# Patient Record
Sex: Female | Born: 1975 | Race: White | Hispanic: No | Smoking: Never smoker
Health system: Southern US, Community
[De-identification: ages and names within clinical notes are randomized; demographics above are authoritative.]

## PROBLEM LIST (undated history)

## (undated) DIAGNOSIS — T7840XA Allergy, unspecified, initial encounter: Secondary | ICD-10-CM

## (undated) DIAGNOSIS — I10 Essential (primary) hypertension: Secondary | ICD-10-CM

## (undated) DIAGNOSIS — M7730 Calcaneal spur, unspecified foot: Secondary | ICD-10-CM

## (undated) DIAGNOSIS — E282 Polycystic ovarian syndrome: Secondary | ICD-10-CM

## (undated) DIAGNOSIS — G5620 Lesion of ulnar nerve, unspecified upper limb: Secondary | ICD-10-CM

## (undated) DIAGNOSIS — Z91018 Allergy to other foods: Secondary | ICD-10-CM

## (undated) DIAGNOSIS — K219 Gastro-esophageal reflux disease without esophagitis: Secondary | ICD-10-CM

## (undated) DIAGNOSIS — E079 Disorder of thyroid, unspecified: Secondary | ICD-10-CM

## (undated) DIAGNOSIS — R600 Localized edema: Secondary | ICD-10-CM

## (undated) DIAGNOSIS — M7580 Other shoulder lesions, unspecified shoulder: Secondary | ICD-10-CM

## (undated) DIAGNOSIS — M21619 Bunion of unspecified foot: Secondary | ICD-10-CM

## (undated) DIAGNOSIS — J302 Other seasonal allergic rhinitis: Secondary | ICD-10-CM

## (undated) DIAGNOSIS — G43909 Migraine, unspecified, not intractable, without status migrainosus: Secondary | ICD-10-CM

## (undated) DIAGNOSIS — J45909 Unspecified asthma, uncomplicated: Secondary | ICD-10-CM

## (undated) DIAGNOSIS — E039 Hypothyroidism, unspecified: Secondary | ICD-10-CM

## (undated) HISTORY — DX: Allergy to other foods: Z91.018

## (undated) HISTORY — DX: Gastro-esophageal reflux disease without esophagitis: K21.9

## (undated) HISTORY — DX: Bunion of unspecified foot: M21.619

## (undated) HISTORY — DX: Hypothyroidism, unspecified: E03.9

## (undated) HISTORY — DX: Unspecified asthma, uncomplicated: J45.909

## (undated) HISTORY — DX: Lesion of ulnar nerve, unspecified upper limb: G56.20

## (undated) HISTORY — DX: Migraine, unspecified, not intractable, without status migrainosus: G43.909

## (undated) HISTORY — DX: Allergy, unspecified, initial encounter: T78.40XA

## (undated) HISTORY — PX: OOPHORECTOMY: SHX86

## (undated) HISTORY — DX: Other shoulder lesions, unspecified shoulder: M75.80

## (undated) HISTORY — DX: Localized edema: R60.0

## (undated) HISTORY — DX: Polycystic ovarian syndrome: E28.2

## (undated) HISTORY — DX: Essential (primary) hypertension: I10

## (undated) HISTORY — DX: Calcaneal spur, unspecified foot: M77.30

## (undated) HISTORY — DX: Disorder of thyroid, unspecified: E07.9

## (undated) HISTORY — DX: Other seasonal allergic rhinitis: J30.2

## (undated) HISTORY — PX: TERATOMA EXCISION: SHX2491

---

## 2005-03-11 ENCOUNTER — Inpatient Hospital Stay (HOSPITAL_COMMUNITY): Admission: AD | Admit: 2005-03-11 | Discharge: 2005-03-12 | Payer: Self-pay | Admitting: Obstetrics & Gynecology

## 2005-03-16 ENCOUNTER — Other Ambulatory Visit: Admission: RE | Admit: 2005-03-16 | Discharge: 2005-03-16 | Payer: Self-pay | Admitting: Obstetrics & Gynecology

## 2005-03-16 ENCOUNTER — Encounter (INDEPENDENT_AMBULATORY_CARE_PROVIDER_SITE_OTHER): Payer: Self-pay | Admitting: *Deleted

## 2005-03-16 ENCOUNTER — Ambulatory Visit: Payer: Self-pay | Admitting: Obstetrics & Gynecology

## 2005-04-06 ENCOUNTER — Ambulatory Visit: Payer: Self-pay | Admitting: Obstetrics & Gynecology

## 2005-10-12 ENCOUNTER — Ambulatory Visit: Payer: Self-pay | Admitting: Obstetrics and Gynecology

## 2006-03-24 ENCOUNTER — Ambulatory Visit: Payer: Self-pay | Admitting: Obstetrics and Gynecology

## 2006-03-24 ENCOUNTER — Encounter: Payer: Self-pay | Admitting: Obstetrics and Gynecology

## 2006-09-06 HISTORY — PX: TUBAL LIGATION: SHX77

## 2006-10-09 ENCOUNTER — Inpatient Hospital Stay (HOSPITAL_COMMUNITY): Admission: AD | Admit: 2006-10-09 | Discharge: 2006-10-09 | Payer: Self-pay | Admitting: Obstetrics & Gynecology

## 2006-12-14 ENCOUNTER — Encounter (INDEPENDENT_AMBULATORY_CARE_PROVIDER_SITE_OTHER): Payer: Self-pay | Admitting: *Deleted

## 2006-12-14 ENCOUNTER — Ambulatory Visit: Payer: Self-pay | Admitting: Obstetrics & Gynecology

## 2006-12-23 ENCOUNTER — Ambulatory Visit: Payer: Self-pay | Admitting: Internal Medicine

## 2006-12-23 ENCOUNTER — Encounter (INDEPENDENT_AMBULATORY_CARE_PROVIDER_SITE_OTHER): Payer: Self-pay | Admitting: *Deleted

## 2006-12-23 DIAGNOSIS — E282 Polycystic ovarian syndrome: Secondary | ICD-10-CM | POA: Insufficient documentation

## 2006-12-23 DIAGNOSIS — I10 Essential (primary) hypertension: Secondary | ICD-10-CM

## 2006-12-23 DIAGNOSIS — D499 Neoplasm of unspecified behavior of unspecified site: Secondary | ICD-10-CM

## 2006-12-23 LAB — CONVERTED CEMR LAB
Alkaline Phosphatase: 101 units/L (ref 39–117)
BUN: 11 mg/dL (ref 6–23)
Calcium: 9.2 mg/dL (ref 8.4–10.5)
Chloride: 101 meq/L (ref 96–112)
Creatinine, Ser: 0.73 mg/dL (ref 0.40–1.20)
Glucose, Bld: 78 mg/dL (ref 70–99)
Potassium: 4.1 meq/L (ref 3.5–5.3)
Total Bilirubin: 0.3 mg/dL (ref 0.3–1.2)
Urobilinogen, UA: 0.2 (ref 0.0–1.0)

## 2006-12-30 ENCOUNTER — Encounter (INDEPENDENT_AMBULATORY_CARE_PROVIDER_SITE_OTHER): Payer: Self-pay | Admitting: *Deleted

## 2006-12-30 ENCOUNTER — Ambulatory Visit: Payer: Self-pay | Admitting: Hospitalist

## 2006-12-30 LAB — CONVERTED CEMR LAB
CO2: 25 meq/L (ref 19–32)
Calcium: 9.4 mg/dL (ref 8.4–10.5)
Creatinine, Ser: 0.71 mg/dL (ref 0.40–1.20)
HDL: 50 mg/dL (ref >39)
Potassium: 4.5 meq/L (ref 3.5–5.3)
Sodium: 137 meq/L (ref 135–145)
Total CHOL/HDL Ratio: 3
VLDL: 31 mg/dL (ref 0–40)

## 2007-01-04 ENCOUNTER — Encounter (INDEPENDENT_AMBULATORY_CARE_PROVIDER_SITE_OTHER): Payer: Self-pay | Admitting: *Deleted

## 2007-01-04 ENCOUNTER — Ambulatory Visit: Payer: Self-pay | Admitting: Hospitalist

## 2007-01-04 LAB — CONVERTED CEMR LAB
BUN: 14 mg/dL (ref 6–23)
CO2: 24 meq/L (ref 19–32)
Calcium: 9.8 mg/dL (ref 8.4–10.5)
Glucose, Bld: 79 mg/dL (ref 70–99)
Potassium: 4.5 meq/L (ref 3.5–5.3)
Sodium: 137 meq/L (ref 135–145)

## 2007-02-07 ENCOUNTER — Encounter (INDEPENDENT_AMBULATORY_CARE_PROVIDER_SITE_OTHER): Payer: Self-pay | Admitting: *Deleted

## 2007-02-07 ENCOUNTER — Ambulatory Visit: Payer: Self-pay | Admitting: Internal Medicine

## 2007-02-15 ENCOUNTER — Ambulatory Visit: Payer: Self-pay | Admitting: Obstetrics & Gynecology

## 2007-03-24 ENCOUNTER — Ambulatory Visit: Payer: Self-pay | Admitting: Hospitalist

## 2007-03-27 ENCOUNTER — Ambulatory Visit: Payer: Self-pay | Admitting: Obstetrics & Gynecology

## 2007-03-27 ENCOUNTER — Inpatient Hospital Stay (HOSPITAL_COMMUNITY): Admission: RE | Admit: 2007-03-27 | Discharge: 2007-03-28 | Payer: Self-pay | Admitting: Obstetrics & Gynecology

## 2007-03-27 ENCOUNTER — Encounter: Payer: Self-pay | Admitting: Obstetrics & Gynecology

## 2007-06-04 IMAGING — US US TRANSVAGINAL NON-OB
2 series · 13 of 25 positions shown · non-contrast
Comparison: 03/11/2005

TRANSABDOMINAL AND TRANSVAGINAL PELVIC ULTRASOUND:

CLINICAL DATA: Ovarian cyst on outside CT.
TECHNIQUE: Both transabdominal and transvaginal ultrasound examinations of the
pelvis were performed including evaluation of the uterus, ovaries, adnexal
regions, and pelvic cul-de-sac.

[Series 1: us renal · 6 of 29 slices shown (1 of 2)]
[im 1/29]
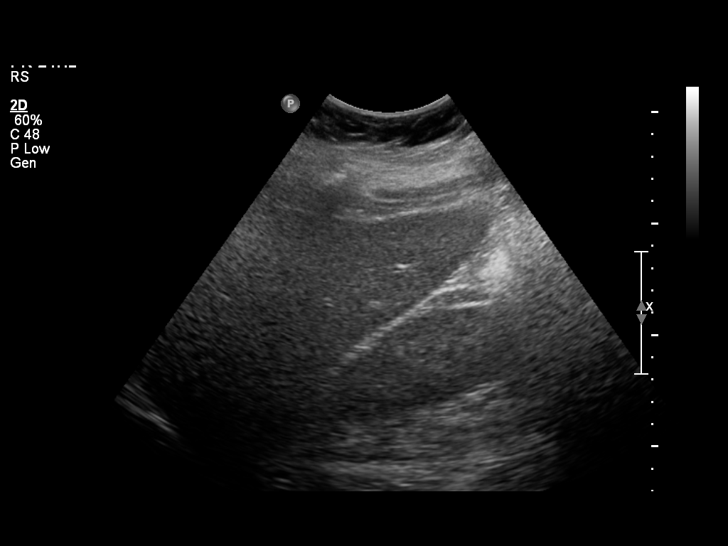
[im 6/29]
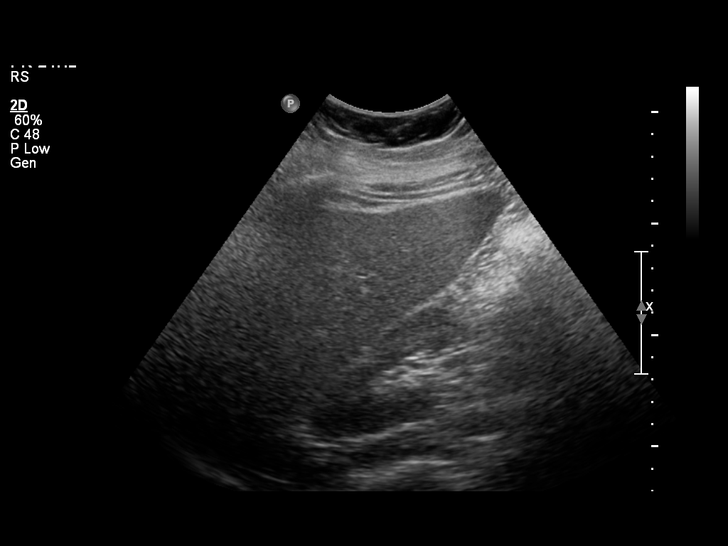
[im 12/29]
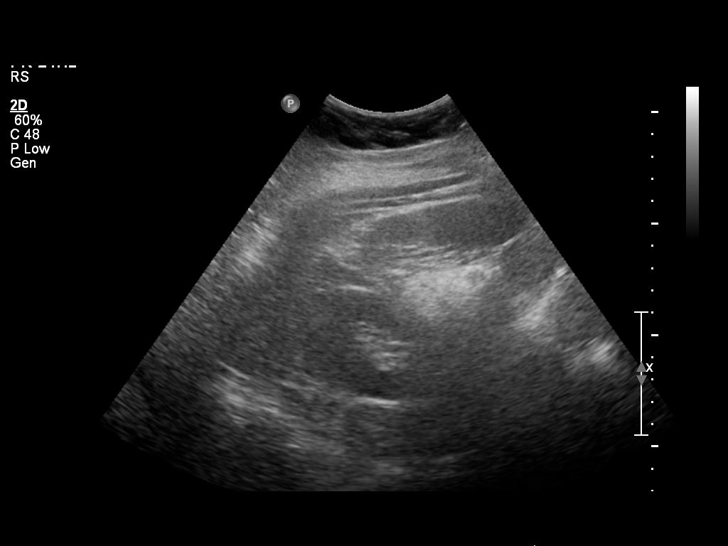
[im 17/29]
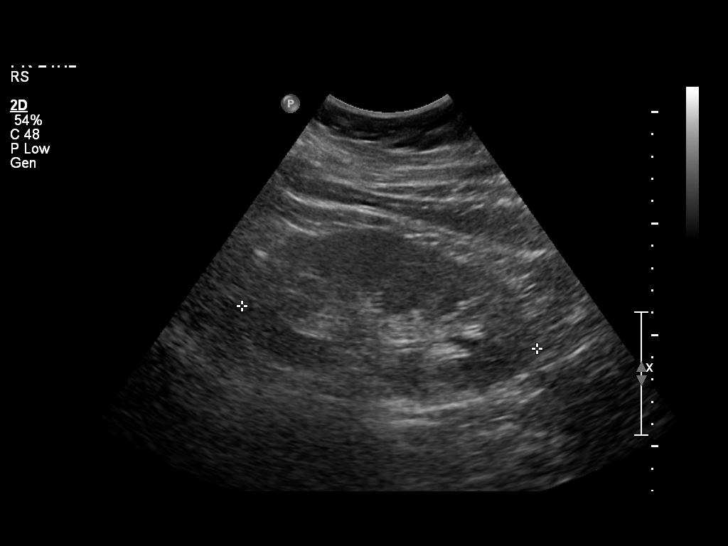
[im 23/29]
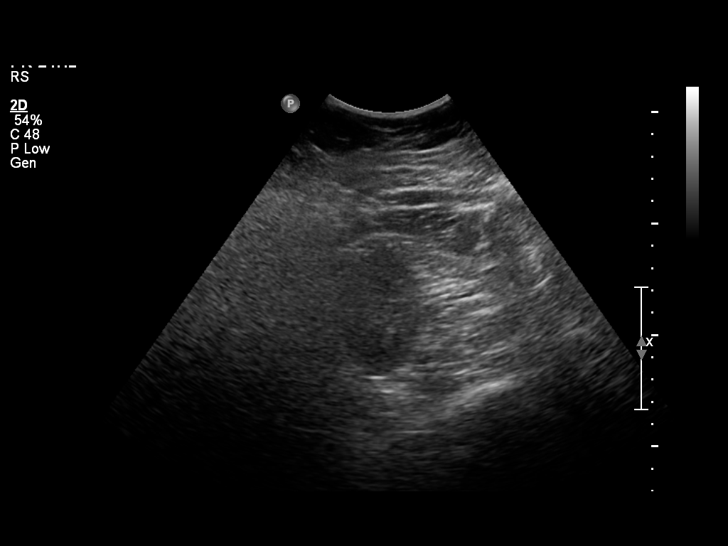
[im 29/29]
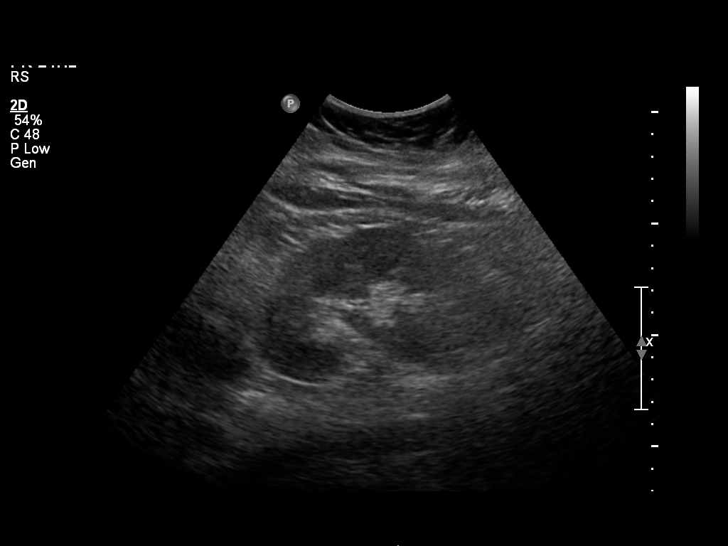

[Series 1: us renal · 7 of 40 slices shown (2 of 2)]
[im 4/40]
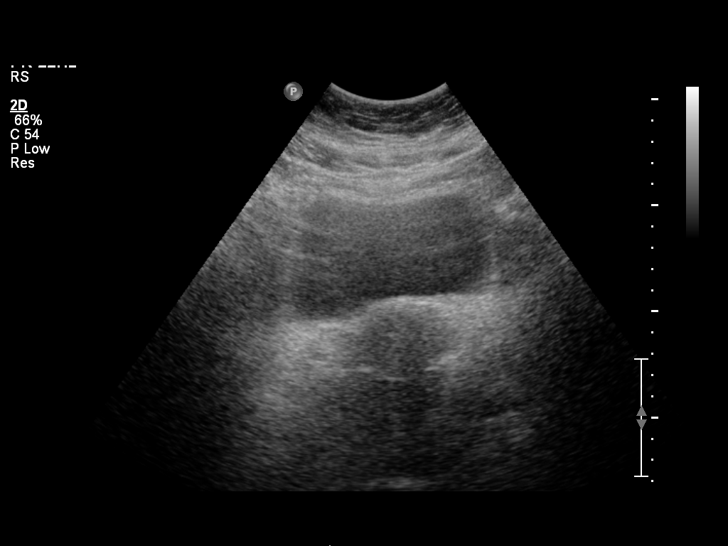
[im 10/40]
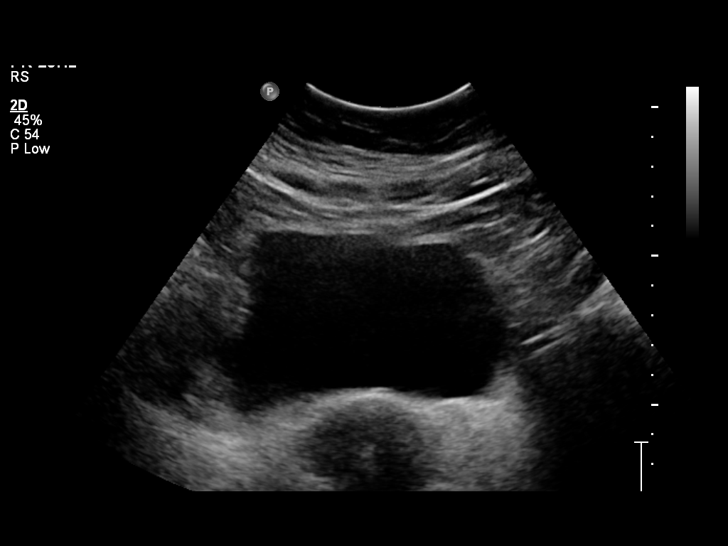
[im 16/40]
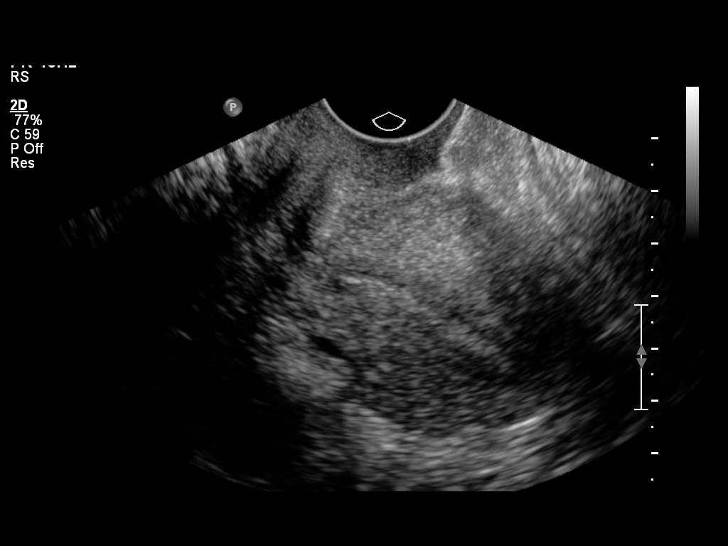
[im 22/40]
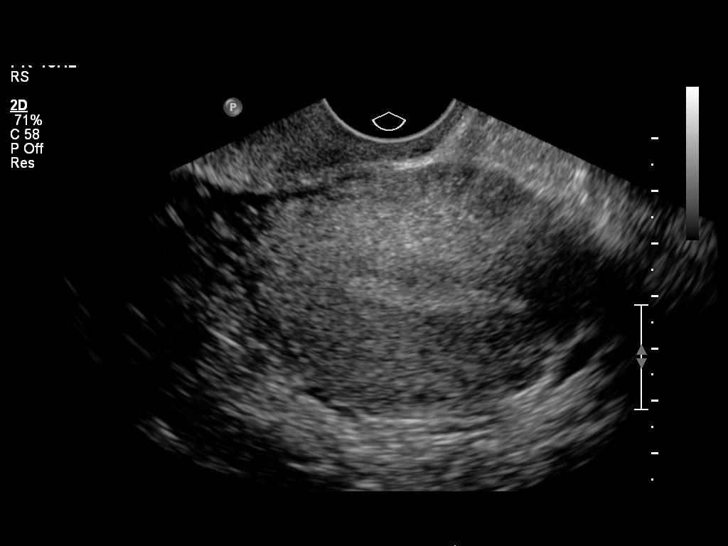
[im 28/40]
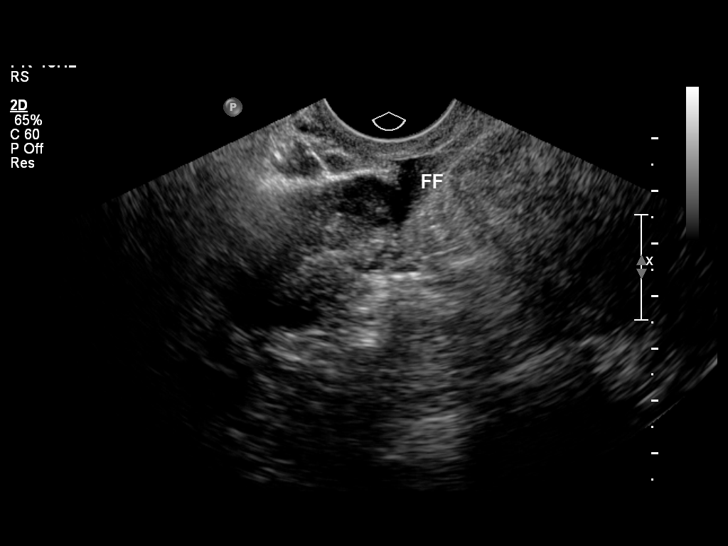
[im 34/40]
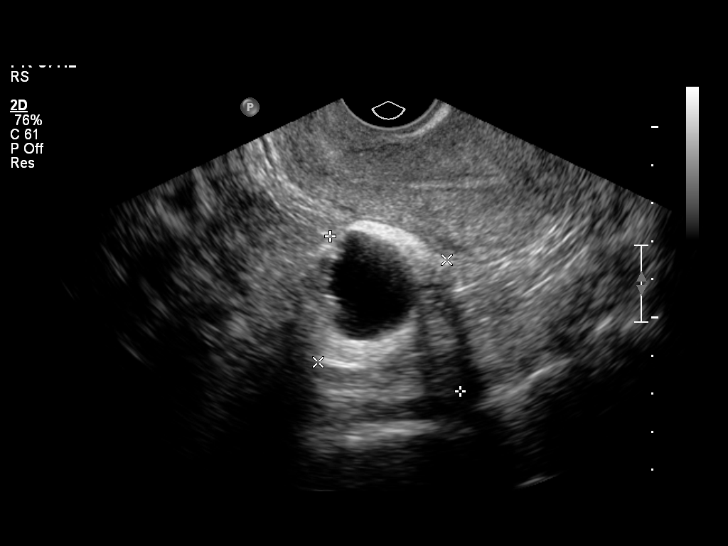
[im 40/40]
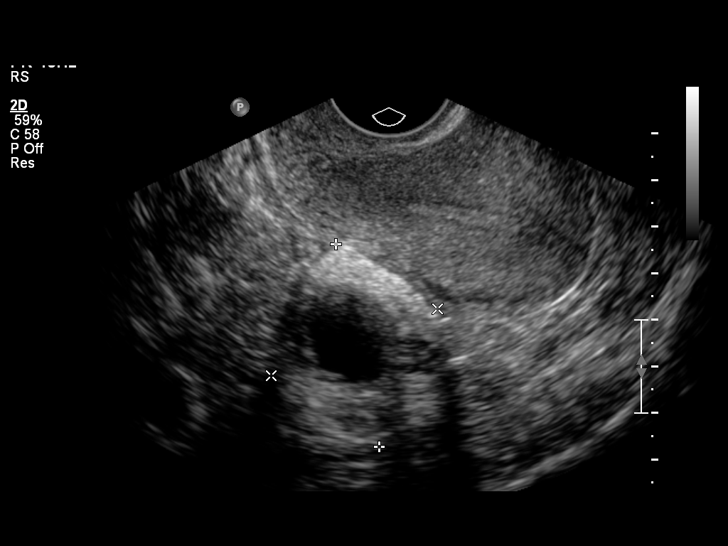

[13 of 25 positions shown; findings below may reference images not displayed]

FINDINGS: The uterus measures 7.4 x 4.5 x 6.4 cm.  Myometrial echotexture is
homogeneous. No evidence for fibroids.  Endometrial stripe thickness is 4 mm.

The right ovary measures 3.6 x 2.5 x 1.8 cm.  Normal left ovarian tissue is not
identified. There is a 7.4 x 4.2 x 5.2 cm heterogeneous mass in the left adnexal
space. This has cystic and solid components in the noncystic components appear
quite echogenic but ultrasound.  A trace amount of free fluid is identified.
IMPRESSION: 7.4 x 4.2 x 5.2 cm heterogeneous lesion in the left adnexal space has cystic and
echogenic components. Direct comparison to the outside CT scan is recommended to
determine definitively whether this is a fat containing lesion as dermoid would
be a consideration. If warranted, MRI may prove helpful to further characterize.

## 2011-01-19 NOTE — Consult Note (Signed)
[name], [name]                 ACCOUNT NO.:  [id number]   MEDICAL RECORD NO.:  [id number]          PATIENT TYPE:  AMB   LOCATION:  [facility]                           [facility]ACILITY:  [facility]   PHYSICIAN:  Allie Bossier, MD        DATE OF BIRTH:  September 07, 1975   DATE OF CONSULTATION:  03/27/2007  DATE OF DISCHARGE:                                 CONSULTATION   PROCEDURE:  1. Exploratory laparotomy.  2. Left salpingo-oophorectomy.  3. Right tubal ligation with Filshie clips.   PREOPERATIVE DIAGNOSES:  1. Morbid obesity.  2. Left adnexal mass.  3. Desires sterility.   POSTOPERATIVE DIAGNOSES:  1. Morbid obesity.  2. Left adnexal mass.  3. Desires sterility.  4. Dermoid cyst pending pathology.   DETAILED PROCEDURE AND FINDINGS:  Risks, benefits, and alternatives of  surgery were explained, understood, and accepted.  Consents were signed.  She understands the 1% failure rate of the tubal ligation and declines  alternative forms of birth control.  She was taken to the operating  room.  General anesthesia was applied without complication.  A Foley  catheter was placed which drained clear urine throughout the case.  Her  abdomen was prepped and draped in the usual sterile fashion.  A  transverse incision was made just beneath her pannus at a natural  crease.  Incision was carried down through approximately 6-7 cm of  adipose tissue.  Bleeding encountered was cauterized with Bovie.  Fascia  was scored in the midline.  Fascial incision was extended bilaterally  with curved Mayo scissors.  The rectus muscles were partially separated  in a transverse fashion using electrosurgical technique.  Excellent  hemostasis was maintained.  The peritoneum was entered with hemostats.  The peritoneal incision was extended bilaterally.  The bowel was packed  out of the abdominal cavity with moist lap sponges.  Using Deaver  retractors, the adnexa on the left was visualized.  It was approximately  7 x 5 cm, cystic,  and consistent with a dermoid.  The ureter on the left  was identified as was the infundibulopelvic ligament.  The  infundibulopelvic ligament was clamped, cut, and ligated, 2-0 Vicryl  sutures used throughout this case unless otherwise specified.  The  uteroovarian ligament was grasped with a Kocher clamp for uterine  manipulation, and the uteroovarian ligament was then clamped, cut, and  ligated.  The left adnexa was removed, and the pedicles were all noted  to be hemostatic.  A Filshie clip was placed in the isthmic region of  the right tube after visually tracing the tube to the fimbriated end.  The right ovary appeared to be normal.  The pedicles were again  inspected as was the ureters.  All was hemostatic, and the ureter was  functioning normally.  The sponges were removed.  The fascia was closed  with 0 Vicryl running nonlocking suture.  The subcutaneous tissue was  irrigated clean and dried.  It was infiltrated with 30 mL of 0.25%  Marcaine.  Please note that approximately 1 mL of 0.25% Marcaine was  injected in the distal portion of the oviduct  for postop pain relief as well.  A subcuticular closure was done with 3-  0 Vicryl suture.  Steri-Strips were placed.  She was taken to the  recovery room after being extubated.  Her Foley catheter drained clear  urine throughout.  She tolerated the procedure well.      Allie Bossier, MD  Electronically Signed     MCD/MEDQ  D:  03/27/2007  T:  03/27/2007  Job:  [id number]

## 2011-01-22 NOTE — Group Therapy Note (Signed)
NAME:  Erin Warren, SHACKLETON NO.:  [id number]   MEDICAL RECORD NO.:  [id number]          PATIENT TYPE:  WOC   LOCATION:  [facility]                   FACILITY:  [facility]   PHYSICIAN:  Elsie Lincoln, MD      DATE OF BIRTH:  Dec 23, 1975   DATE OF SERVICE:  04/06/2005                                    CLINIC NOTE   Patient is here for follow-up of test results.  There is a detailed clinic  note done on March 16, 2005.  Patient had a normal Pap smear, a normal  endometrial biopsy.  Her GTT showed insulin glucose ratio 3.6 which is  consistent with insulin resistance.  The patient basically has PCO which is  causing her oligomenorrhea and heavy anovulatory bleeds.  Her TSH was  normal.  Her CBC had hemoglobin of 12.4 which is normal.  Her 17  hydroxyprogesterone was normal.  We discussed when the patient would become  pregnant.  She does not want to at this time so we will not put her on  Glucophage.  However, we will continue to cycle her with Yasmin and use  Naproxen for pain with menses.  When the patient wants to become pregnant we  will have to consider using Glucophage.  We also encouraged the patient to  lose weight.  She seems to be morbidly obese at 262 pounds and a height of 5  feet 6 inches.  She drinks a lot of juices and sweet teas and red meat.  We  discussed how to effectively lose weight and to increase her daily activity.  She needs to be an approximately 1700-1800 kilocalorie diet and burn  approximately 2400 kilocalories a day.  The patient also needs to have a  fasting lipid profile as people with PCO are at increased risk for high  cholesterol.  The patient was given prescription for Yasmin to continue for  six months and will come back for follow-up at that point and see how her  weight loss is coming.       KL/MEDQ  D:  04/06/2005  T:  04/07/2005  Job:  [id number]

## 2011-01-22 NOTE — Group Therapy Note (Signed)
NAME:  Erin Warren, LATTERELL NO.:  [id number]   MEDICAL RECORD NO.:  [id number]          PATIENT TYPE:  WOC   LOCATION:  [facility]                   FACILITY:  [facility]   PHYSICIAN:  Kathlyn Sacramento, M.D.   DATE OF BIRTH:  1976/08/14   DATE OF SERVICE:                                    CLINIC NOTE   CHIEF COMPLAINT:  Followup for polycystic ovarian syndrome 38-month followup.   HISTORY OF PRESENT ILLNESS:  Patient is a 35 year old white female with  findings consistent with polycystic ovarian syndrome.  Her last appointment  was in August of 2006 where she was instructed on a low-calorie diet, and  encouraged to exercise.  She was also to get a fasting lipid profile.  She  has been on Yasmin, and states that her periods are regular with that, and  her periods last 4 days instead of 5 days.  She does not desire to become  pregnant at this time.  Patient has changed her diet.  She is drinking less  juice.   PHYSICAL EXAMINATION:  VITAL SIGNS:  Temp 96.3, pulse of 111, blood pressure  142/94, weight 250.9, height 5 feet 6 inches.  GENERAL:  Well-developed, well-nourished female in no acute distress.   IMPRESSION:  Polycystic ovarian syndrome.   PLAN:  Patient has lost over 11 pounds.  She was encouraged to continue her  lifestyle changes.  She is planning on joining a gym that is opening by her  house in March.  Patient was told to continue the Yasmin, but if she were to  desire pregnancy, she could be started on metformin, and birth control pills  could be stopped.  She also states that her premenstrual symptoms are better  with the Yasmin.  She has not done her fasting lipid profile, but plans to  do it in the next 2 weeks.  Patient is to follow up in 6 months for her  annual exam.           ______________________________  Kathlyn Sacramento, M.D.     AC/MEDQ  D:  10/12/2005  T:  10/12/2005  Job:  [id number]

## 2011-01-22 NOTE — Group Therapy Note (Signed)
NAME:  ERION, HERMANS NO.:  [id number]   MEDICAL RECORD NO.:  [id number]          PATIENT TYPE:  WOC   LOCATION:  [facility]                   FACILITY:  [facility]   PHYSICIAN:  Argentina Donovan, MD        DATE OF BIRTH:  08/24/1976   DATE OF SERVICE:  03/24/2006                                    CLINIC NOTE   The patient is a 35 year old white female with findings consistent with  polycystic ovarian syndrome.  Has been on Yasmin with good success, which  has resolved her acne and stopped her abnormal hair growth.  She has no  significant complaints and is in for routine Pap smear.  She has a slight  hypertension at 160/93, weighing 253 pounds and is 5 feet 6 inches.  She  also has insulin resistance.  We have counseled her about polycystic ovarian  syndrome and its relationship with insulin resistance.  We have encouraged  her to discuss the hypertension and possibility of Glucophage medication in  addition to what she is on with her insurance.   ALLERGIES:  RASPBERRIES, GARLIC, ONIONS and EGG PLANTS.   Otherwise is in good health.           ______________________________  Argentina Donovan, MD     PR/MEDQ  D:  03/24/2006  T:  03/24/2006  Job:  [id number]

## 2011-01-22 NOTE — Group Therapy Note (Signed)
NAME:  Erin Warren, Erin Warren NO.:  [id number]   MEDICAL RECORD NO.:  [id number]          PATIENT TYPE:  WOC   LOCATION:  [facility]                   [facility]ACILITY:  [facility]   PHYSICIAN:  Elsie Lincoln, MD      DATE OF BIRTH:  Aug 10, 1976   DATE OF SERVICE:  03/16/2005                                    CLINIC NOTE   HISTORY OF PRESENT ILLNESS:  The patient is a 35 year old G0 female, LMP  February 22, 2005, who presented to [facility] on March 11, 2005 for heavy vaginal  bleeding. She was seen by Dr. Dierdre Forth who ordered and ultrasound and  a CBC and GC/Chlamydia cultures. The ultrasound showed an 11 mm endometrial  stripe an otherwise grossly abnormal uterus, the uterus is retroverted. The  right ovary was normal and there was a simple cyst in the left adnexa.  However no definitive left ovary could be seen. GC/Chlamydia were negative  and the patient's hemoglobin at the time of the heavy bleeding was 12.8.  The patient states that she has had irregular menses all her life. She skips  several a year. She has not seen a gynecologist since at least 2001 when her  last Pap smear was done. This was sent at [facility]. Also of note, the  patient states she had dyspareunia 3 months ago and would like to be worked  up for that. Her husband states that they have not had sex since this  dyspareunia episode. The patient is currently still bleeding a little bit  each day towards the end of the day after the Provera is wearing off.   GYN HISTORY:  No history of fibroids, ovarian cysts, sexually transmitted  diseases, Pap smears that are abnormal. History of dyspareunia as described  above.   PAST MEDICAL HISTORY:  Questionably new diagnosed hypertension. She does not  have a primary care physician.   PAST SURGICAL HISTORY:  Denies.   SOCIAL HISTORY:  A rare alcoholic drink. No smoking or drugs. She works as a  Engineer, materials.   FAMILY HISTORY:  Negative  for ovarian cancer, breast cancer, colon cancer,  uterine cancer, cervical cancer.   REVIEW OF SYSTEMS:  No chest pain, shortness of breath, nausea, vomiting,  diarrhea, change in bladder habits.   ALLERGIES:  Denies.   MEDICATIONS:  Provera and Naproxen.   PHYSICAL EXAMINATION:  VITAL SIGNS:  Temperature 98.1, pulse 123, blood  pressure 147/87, weight 260.5. Height 5 feet, 6 inches.  GENERAL:  The patient appears very anxious and states she has white coat  syndrome.  She is well-nourished and obese.  GENERAL:  The patient has evidence of hirsutism on her upper lip and chin.  ABDOMEN:  Obese, hirsutism in the midline.  GENITALIA:  Tanner 5. Vagina pink, normal ruga. Cervix nulliparous, closed,  nontender. Uterus difficult to palpate secondary to habitus. Adnexa no  masses but difficult to palpate secondary to habitus.   ASSESSMENT/PLAN:  84.  A 35 year old female with abnormal uterine bleeding and menorrhagia this      past month.  Pap smear, endometrial biopsy  done today.  1.  Will get repeat CBC to see how heavy her bleeding is.  2.  TSH, 7-hydroxyprogesterone and 2 hour GTT with fasting Insulin and      glucose ratio.  3.  Pap smear from [facility].  4.  The patient is to be started on niacin and stop Provera.  5.  Return to clinic in 2 weeks.       KL/MEDQ  D:  03/16/2005  T:  03/17/2005  Job:  [id number]

## 2011-06-21 LAB — CBC
HCT: 34.7 — ABNORMAL LOW
HCT: 39.2
Hemoglobin: 11.9 — ABNORMAL LOW
Hemoglobin: 13.1
Platelets: 382

## 2011-06-21 LAB — PREGNANCY, URINE: Preg Test, Ur: NEGATIVE

## 2011-06-21 LAB — TYPE AND SCREEN: Antibody Screen: NEGATIVE

## 2011-06-21 LAB — ABO/RH: ABO/RH(D): O POS

## 2011-09-09 ENCOUNTER — Ambulatory Visit (INDEPENDENT_AMBULATORY_CARE_PROVIDER_SITE_OTHER): Payer: Managed Care, Other (non HMO)

## 2011-09-09 DIAGNOSIS — R1012 Left upper quadrant pain: Secondary | ICD-10-CM

## 2011-09-09 DIAGNOSIS — Z202 Contact with and (suspected) exposure to infections with a predominantly sexual mode of transmission: Secondary | ICD-10-CM

## 2011-09-09 DIAGNOSIS — R110 Nausea: Secondary | ICD-10-CM

## 2011-09-09 DIAGNOSIS — R599 Enlarged lymph nodes, unspecified: Secondary | ICD-10-CM

## 2011-09-17 ENCOUNTER — Encounter: Payer: Self-pay | Admitting: Physician Assistant

## 2011-10-08 ENCOUNTER — Encounter: Payer: Self-pay | Admitting: Physician Assistant

## 2011-10-18 ENCOUNTER — Ambulatory Visit (INDEPENDENT_AMBULATORY_CARE_PROVIDER_SITE_OTHER): Payer: Managed Care, Other (non HMO) | Admitting: Physician Assistant

## 2011-10-18 VITALS — BP 123/71 | HR 74 | Temp 98.2°F | Resp 16 | Ht 66.0 in | Wt 197.0 lb

## 2011-10-18 DIAGNOSIS — R062 Wheezing: Secondary | ICD-10-CM

## 2011-10-18 DIAGNOSIS — E282 Polycystic ovarian syndrome: Secondary | ICD-10-CM

## 2011-10-18 DIAGNOSIS — E039 Hypothyroidism, unspecified: Secondary | ICD-10-CM

## 2011-10-18 DIAGNOSIS — I10 Essential (primary) hypertension: Secondary | ICD-10-CM

## 2011-10-18 LAB — TSH: TSH: 1.965 u[IU]/mL (ref 0.350–4.500)

## 2011-10-18 LAB — POCT CBC
Granulocyte percent: 70.2 %G (ref 37–80)
Hemoglobin: 14.1 g/dL (ref 12.2–16.2)
Lymph, poc: 2.1 (ref 0.6–3.4)
MCH, POC: 30.2 pg (ref 27–31.2)
MPV: 8.7 fL (ref 0–99.8)
POC Granulocyte: 6.9 (ref 2–6.9)
POC LYMPH PERCENT: 21 %L (ref 10–50)
POC MID %: 8.8 %M (ref 0–12)
Platelet Count, POC: 357 10*3/uL (ref 142–424)
RBC: 4.67 M/uL (ref 4.04–5.48)
RDW, POC: 14.6 %

## 2011-10-18 MED ORDER — ALBUTEROL SULFATE HFA 108 (90 BASE) MCG/ACT IN AERS: 2.0000 | INHALATION_SPRAY | Freq: Four times a day (QID) | RESPIRATORY_TRACT | Status: DC | PRN

## 2011-10-18 MED ORDER — FLUTICASONE PROPIONATE 50 MCG/ACT NA SUSP: 2.0000 | Freq: Every day | NASAL | Status: DC

## 2011-10-18 MED ORDER — LEVOTHYROXINE SODIUM 25 MCG PO TABS: 25.0000 ug | ORAL_TABLET | Freq: Every day | ORAL | Status: DC

## 2011-10-18 MED ORDER — METFORMIN HCL 500 MG PO TABS: 500.0000 mg | ORAL_TABLET | Freq: Two times a day (BID) | ORAL | Status: DC

## 2011-10-18 MED ORDER — BECLOMETHASONE DIPROPIONATE 40 MCG/ACT IN AERS: 2.0000 | INHALATION_SPRAY | Freq: Two times a day (BID) | RESPIRATORY_TRACT | Status: DC

## 2011-10-18 NOTE — Patient Instructions (Signed)
Check BP at least 4-5 X/week and record.  Mail /call with numbers if doesn't stay controlled off meds. Continue QVAR through end of February, then try 1 -2 weeks without it.  If wheezing returns, re-start. Use albuterol inhaler 15 min prior to exercise if needed.

## 2011-10-18 NOTE — Progress Notes (Signed)
  Subjective:    Patient ID: Erin Warren, female    DOB: [birth year], 36 y.o.   MRN: [id number]  HPI  Doing well.  See last OV.  Using QVAR bid.  Only has wheezing when she attempts to exercise.  Has been taking 1/2 tab of Lisinopril daily and no HCTZ and brings in home BP records which are fantastic: 110/77 107/68 127/78 106/77 119/81 She has made healthy lifestyle changes including diet and adding exercise.    Review of Systems  All other systems reviewed and are negative.       Objective:   Physical Exam  Nursing note and vitals reviewed. Constitutional: She is oriented to person, place, and time. She appears well-developed and well-nourished.  HENT:  Head: Normocephalic and atraumatic.  Neck: Normal range of motion. Neck supple.  Cardiovascular: Normal rate, regular rhythm, normal heart sounds and intact distal pulses.  Exam reveals no gallop and no friction rub.   No murmur heard. Pulmonary/Chest: Effort normal. She has wheezes (end forced expiration only B bases).  Neurological: She is alert and oriented to person, place, and time.  Skin: Skin is warm and dry.          Assessment & Plan:  htn-controlled/low normal on 20mg  of Lisinopril and no hctz.  Lisinopril may be contributing to wheezing.  Since BP is so well controlled, patient never had BP issues until she was overweight, and patient has lost 52 pounds over the last year; we will do a 2-3 week trial without medications.  Patient has cuff at home and agrees to check daily.  Will call me if any sign of and increase in BP.  PCOS-controlled.  Again, with the significant amount of weight she has lost, our goal will be to reduce to the lowest possible dose on metformin, but no change today.  Consider again at 175 pounds  Bronchospasm/wheezing:  For now continue QVAR.  Hopefully d/c of lisinopril will help.  Consider trial without QVAR in a few weeks.

## 2011-11-06 ENCOUNTER — Other Ambulatory Visit: Payer: Self-pay | Admitting: Physician Assistant

## 2011-11-11 ENCOUNTER — Encounter: Payer: Self-pay | Admitting: Physician Assistant

## 2011-11-12 ENCOUNTER — Other Ambulatory Visit: Payer: Self-pay | Admitting: Family Medicine

## 2011-11-12 MED ORDER — BECLOMETHASONE DIPROPIONATE 40 MCG/ACT IN AERS: 2.0000 | INHALATION_SPRAY | Freq: Two times a day (BID) | RESPIRATORY_TRACT | Status: DC

## 2011-11-16 ENCOUNTER — Ambulatory Visit (INDEPENDENT_AMBULATORY_CARE_PROVIDER_SITE_OTHER): Payer: Managed Care, Other (non HMO) | Admitting: Physician Assistant

## 2011-11-16 VITALS — BP 138/84 | HR 90 | Temp 98.5°F | Resp 16 | Ht 66.0 in | Wt 195.0 lb

## 2011-11-16 DIAGNOSIS — J209 Acute bronchitis, unspecified: Secondary | ICD-10-CM

## 2011-11-16 DIAGNOSIS — J040 Acute laryngitis: Secondary | ICD-10-CM

## 2011-11-16 DIAGNOSIS — J9801 Acute bronchospasm: Secondary | ICD-10-CM

## 2011-11-16 MED ORDER — METHYLPREDNISOLONE ACETATE 80 MG/ML IJ SUSP
80.0000 mg | Freq: Once | INTRAMUSCULAR | Status: AC
  Administered 2011-11-16: 80 mg via INTRAMUSCULAR

## 2011-11-16 MED ORDER — AZITHROMYCIN 250 MG PO TABS: ORAL_TABLET | ORAL | Status: AC

## 2011-11-16 MED ORDER — PROMETHAZINE-DM 6.25-15 MG/5ML PO SYRP: 5.0000 mL | ORAL_SOLUTION | Freq: Four times a day (QID) | ORAL | Status: AC | PRN

## 2011-11-16 NOTE — Patient Instructions (Signed)
Fluids, rest, respiratory care

## 2011-11-16 NOTE — Progress Notes (Signed)
  Subjective:    Patient ID: Erin Warren, female    DOB: 1976-05-24, 36 y.o.   MRN: [id number]  HPI    Review of Systems     Objective:   Physical Exam        Assessment & Plan:

## 2011-11-16 NOTE — Progress Notes (Signed)
  Subjective:    Patient ID: Erin Warren, female    DOB: 1976-02-25, 36 y.o.   MRN: [id number]  HPI  Cough X 1 week, mucus is thick and yellow.  Voice is hoarse.  Started wheezing again and had to restart Qvar.  She has plenty of albuterol.  No f/c. Live in partner had similar illness but his resolved.  BP has been better since increasing her Lisinopril until she got sick.  Review of Systems  All other systems reviewed and are negative.       Objective:   Physical Exam  Nursing note and vitals reviewed. Constitutional: She is oriented to person, place, and time. She appears well-developed and well-nourished.  HENT:  Head: Normocephalic and atraumatic.  Mouth/Throat: Oropharynx is clear and moist. No oropharyngeal exudate (but PND present and throat appears raw.).  Neck: Normal range of motion. Neck supple. No thyromegaly present.  Cardiovascular: Normal rate, regular rhythm, normal heart sounds and intact distal pulses.  Exam reveals no gallop and no friction rub.   No murmur heard. Pulmonary/Chest: Effort normal. She has wheezes (mild wheezing bilaterally with expiration.).  Lymphadenopathy:    She has cervical adenopathy (minimal and tender.  No discrete nodes).  Neurological: She is alert and oriented to person, place, and time.  Skin: Skin is warm and dry.          Assessment & Plan:  Bronchitis and laryngitis with bronchospasm. See AVS.  Last blood sugar recently was ok and is on metformin for PCOS

## 2011-12-04 ENCOUNTER — Other Ambulatory Visit: Payer: Self-pay | Admitting: Physician Assistant

## 2011-12-31 ENCOUNTER — Other Ambulatory Visit: Payer: Self-pay | Admitting: Physician Assistant

## 2012-01-28 ENCOUNTER — Other Ambulatory Visit: Payer: Self-pay | Admitting: Physician Assistant

## 2012-02-19 ENCOUNTER — Other Ambulatory Visit: Payer: Self-pay | Admitting: Physician Assistant

## 2012-03-24 ENCOUNTER — Ambulatory Visit (INDEPENDENT_AMBULATORY_CARE_PROVIDER_SITE_OTHER): Payer: Managed Care, Other (non HMO) | Admitting: Physician Assistant

## 2012-03-24 ENCOUNTER — Encounter: Payer: Self-pay | Admitting: Physician Assistant

## 2012-03-24 VITALS — BP 123/75 | HR 86 | Temp 97.9°F | Resp 16 | Ht 65.5 in | Wt 184.4 lb

## 2012-03-24 DIAGNOSIS — J683 Other acute and subacute respiratory conditions due to chemicals, gases, fumes and vapors: Secondary | ICD-10-CM

## 2012-03-24 DIAGNOSIS — R030 Elevated blood-pressure reading, without diagnosis of hypertension: Secondary | ICD-10-CM

## 2012-03-24 DIAGNOSIS — N926 Irregular menstruation, unspecified: Secondary | ICD-10-CM

## 2012-03-24 DIAGNOSIS — J4530 Mild persistent asthma, uncomplicated: Secondary | ICD-10-CM | POA: Insufficient documentation

## 2012-03-24 DIAGNOSIS — N921 Excessive and frequent menstruation with irregular cycle: Secondary | ICD-10-CM

## 2012-03-24 LAB — TSH: TSH: 1.951 u[IU]/mL (ref 0.350–4.500)

## 2012-03-24 MED ORDER — DROSPIRENONE-ETHINYL ESTRADIOL 3-0.03 MG PO TABS: 1.0000 | ORAL_TABLET | Freq: Every day | ORAL | Status: DC

## 2012-03-24 MED ORDER — METFORMIN HCL 500 MG PO TABS: 500.0000 mg | ORAL_TABLET | Freq: Two times a day (BID) | ORAL | Status: DC

## 2012-03-24 MED ORDER — LEVOTHYROXINE SODIUM 25 MCG PO TABS: 25.0000 ug | ORAL_TABLET | Freq: Every day | ORAL | Status: DC

## 2012-03-24 NOTE — Progress Notes (Signed)
  Subjective:    Patient ID: Erin Warren, female    DOB: 15-Dec-1975, 36 y.o.   MRN: [id number]  HPIhere for recheck PCOS, wheezing, irregular menses, and elevated BP readings.  Brings BP log which numbers since march averaging ~130/84.  She has continued dietary changes and exercise and is down 10 more pounds!!  Compliant with meds.  Her periods have been coming closer together (like every 3 weeks) and lasting about 3 days.  She has been eating a lot of soy recently Review of Systems  All other systems reviewed and are negative.        Objective:   Physical Exam  Nursing note and vitals reviewed. Constitutional: She is oriented to person, place, and time. She appears well-developed and well-nourished.  HENT:  Head: Normocephalic and atraumatic.  Cardiovascular: Normal rate, regular rhythm and normal heart sounds.   Pulmonary/Chest: Effort normal and breath sounds normal.  Neurological: She is alert and oriented to person, place, and time.  Skin: Skin is warm and dry.        Assessment & Plan:  Hypothyroid-check level since irregular menses. Irregular menses- Decrease soy intake due to estrogen content. Start yasmin(per her request) Obesity-great work with lifestyle changes!!  Keep up the great work! PCOS-stable F/up for CPE 6-9 months Blood pressure is looking great! Airway reactivity-stable on Qvar.

## 2012-03-29 ENCOUNTER — Other Ambulatory Visit: Payer: Self-pay | Admitting: Physician Assistant

## 2012-05-27 ENCOUNTER — Other Ambulatory Visit: Payer: Self-pay | Admitting: Physician Assistant

## 2012-08-30 ENCOUNTER — Other Ambulatory Visit: Payer: Self-pay | Admitting: Physician Assistant

## 2012-09-13 ENCOUNTER — Ambulatory Visit (INDEPENDENT_AMBULATORY_CARE_PROVIDER_SITE_OTHER): Payer: Managed Care, Other (non HMO) | Admitting: Physician Assistant

## 2012-09-13 ENCOUNTER — Encounter: Payer: Self-pay | Admitting: Physician Assistant

## 2012-09-13 VITALS — BP 122/86 | HR 88 | Temp 98.8°F | Resp 17 | Ht 65.0 in | Wt 165.0 lb

## 2012-09-13 DIAGNOSIS — Z0000 Encounter for general adult medical examination without abnormal findings: Secondary | ICD-10-CM

## 2012-09-13 DIAGNOSIS — E039 Hypothyroidism, unspecified: Secondary | ICD-10-CM

## 2012-09-13 DIAGNOSIS — Z23 Encounter for immunization: Secondary | ICD-10-CM

## 2012-09-13 DIAGNOSIS — Z01419 Encounter for gynecological examination (general) (routine) without abnormal findings: Secondary | ICD-10-CM

## 2012-09-13 DIAGNOSIS — E282 Polycystic ovarian syndrome: Secondary | ICD-10-CM

## 2012-09-13 DIAGNOSIS — Z13220 Encounter for screening for lipoid disorders: Secondary | ICD-10-CM

## 2012-09-13 LAB — POCT URINALYSIS DIPSTICK
Bilirubin, UA: NEGATIVE
Glucose, UA: NEGATIVE
Spec Grav, UA: 1.025
Urobilinogen, UA: 0.2

## 2012-09-13 LAB — CBC WITH DIFFERENTIAL/PLATELET
Basophils Absolute: 0 10*3/uL (ref 0.0–0.1)
Basophils Relative: 1 % (ref 0–1)
Eosinophils Absolute: 0.3 10*3/uL (ref 0.0–0.7)
Eosinophils Relative: 3 % (ref 0–5)
HCT: 46.6 % — ABNORMAL HIGH (ref 36.0–46.0)
Lymphocytes Relative: 25 % (ref 12–46)
MCH: 31.5 pg (ref 26.0–34.0)
MCHC: 35 g/dL (ref 30.0–36.0)
MCV: 90 fL (ref 78.0–100.0)
Monocytes Absolute: 0.5 10*3/uL (ref 0.1–1.0)
Platelets: 408 10*3/uL — ABNORMAL HIGH (ref 150–400)
RDW: 13.1 % (ref 11.5–15.5)

## 2012-09-13 LAB — COMPREHENSIVE METABOLIC PANEL
Albumin: 4.6 g/dL (ref 3.5–5.2)
BUN: 12 mg/dL (ref 6–23)
Calcium: 10.3 mg/dL (ref 8.4–10.5)
Chloride: 102 mEq/L (ref 96–112)
Creat: 0.76 mg/dL (ref 0.50–1.10)
Glucose, Bld: 74 mg/dL (ref 70–99)
Potassium: 4.1 mEq/L (ref 3.5–5.3)

## 2012-09-13 LAB — LIPID PANEL
Cholesterol: 164 mg/dL (ref 0–200)
HDL: 64 mg/dL (ref >39)
Total CHOL/HDL Ratio: 2.6 Ratio
Triglycerides: 106 mg/dL (ref <150)

## 2012-09-14 LAB — PAP IG W/ RFLX HPV ASCU

## 2012-09-15 MED ORDER — BECLOMETHASONE DIPROPIONATE 80 MCG/ACT IN AERS: 1.0000 | INHALATION_SPRAY | RESPIRATORY_TRACT | Status: DC | PRN

## 2012-09-15 MED ORDER — METFORMIN HCL 500 MG PO TABS: 500.0000 mg | ORAL_TABLET | Freq: Two times a day (BID) | ORAL | Status: DC

## 2012-09-15 MED ORDER — FLUTICASONE PROPIONATE 50 MCG/ACT NA SUSP: 2.0000 | Freq: Every day | NASAL | Status: DC

## 2012-09-15 MED ORDER — LEVOTHYROXINE SODIUM 25 MCG PO TABS: 25.0000 ug | ORAL_TABLET | Freq: Every day | ORAL | Status: DC

## 2012-09-15 MED ORDER — ALBUTEROL SULFATE HFA 108 (90 BASE) MCG/ACT IN AERS: 2.0000 | INHALATION_SPRAY | Freq: Four times a day (QID) | RESPIRATORY_TRACT | Status: DC | PRN

## 2012-09-15 NOTE — Progress Notes (Signed)
Subjective:    Patient ID: Erin Warren, female    DOB: 1976/05/21, 37 y.o.   MRN: [id number]  HPI 37 yr old CF presents for CPE.  She is doing well other than lower leg swelling and still having to use her inhaler when she exercises.  She has lost 20 more pounds over the last year!  She stopped taking the ocella OCP.  She only stopped it about 1 month ago, so she is going to see how her periods regulate with metformin and now that she has lost more weight.  She is compliant with her meds. She has a form with her I need to fill out.  Review of Systems  All other systems reviewed and are negative.       Objective:   Physical Exam  Nursing note and vitals reviewed. Constitutional: She is oriented to person, place, and time. She appears well-developed.  HENT:  Head: Normocephalic and atraumatic.  Right Ear: External ear normal.  Left Ear: External ear normal.  Nose: Nose normal.  Mouth/Throat: No oropharyngeal exudate.  Eyes: Conjunctivae normal and EOM are normal. Pupils are equal, round, and reactive to light.  Neck: Normal range of motion. Neck supple. No thyromegaly present.  Cardiovascular: Normal rate, regular rhythm, normal heart sounds and intact distal pulses.  Exam reveals no gallop and no friction rub.   No murmur heard. Pulmonary/Chest: Effort normal and breath sounds normal. No respiratory distress. She has no wheezes. She has no rales. She exhibits no mass. Right breast exhibits no inverted nipple, no mass, no nipple discharge, no skin change and no tenderness. Left breast exhibits no inverted nipple, no mass, no nipple discharge, no skin change and no tenderness.       No wheezing even with forced expiration.  Genitourinary: Vagina normal. No vaginal discharge found.       There is a small amount of brownish blood at the cervix I swabbed away before taking the pap smear.  Pap smear collected.  Cervix nulliparous with normal appearance.  Musculoskeletal: Normal range of  motion. She exhibits edema (she does have mild lower leg edema that is non-pitting). She exhibits no tenderness.  Lymphadenopathy:       Head (right side): No tonsillar, no preauricular and no occipital adenopathy present.       Head (left side): No tonsillar, no preauricular and no occipital adenopathy present.    She has no cervical adenopathy.    She has no axillary adenopathy.  Neurological: She is alert and oriented to person, place, and time. She has normal reflexes. No cranial nerve deficit.  Skin: Skin is warm and dry.  Psychiatric: She has a normal mood and affect. Her behavior is normal.   Results for orders placed in visit on 09/13/12  TSH      Component Value Range   TSH 1.974  0.350 - 4.500 uIU/mL  COMPREHENSIVE METABOLIC PANEL      Component Value Range   Sodium 140  135 - 145 mEq/L   Potassium 4.1  3.5 - 5.3 mEq/L   Chloride 102  96 - 112 mEq/L   CO2 24  19 - 32 mEq/L   Glucose, Bld 74  70 - 99 mg/dL   BUN 12  6 - 23 mg/dL   Creat 2.95  2.84 - 1.32 mg/dL   Total Bilirubin 0.5  0.3 - 1.2 mg/dL   Alkaline Phosphatase 73  39 - 117 U/L   AST 18  0 - 37  U/L   ALT 14  0 - 35 U/L   Total Protein 8.0  6.0 - 8.3 g/dL   Albumin 4.6  3.5 - 5.2 g/dL   Calcium 91.4  8.4 - 78.2 mg/dL  LIPID PANEL      Component Value Range   Cholesterol 164  0 - 200 mg/dL   Triglycerides 956  <213 mg/dL   HDL 64  >08 mg/dL   Total CHOL/HDL Ratio 2.6     VLDL 21  0 - 40 mg/dL   LDL Cholesterol 79  0 - 99 mg/dL  CBC WITH DIFFERENTIAL      Component Value Range   WBC 8.1  4.0 - 10.5 K/uL   RBC 5.18 (*) 3.87 - 5.11 MIL/uL   Hemoglobin 16.3 (*) 12.0 - 15.0 g/dL   HCT 65.7 (*) 84.6 - 96.2 %   MCV 90.0  78.0 - 100.0 fL   MCH 31.5  26.0 - 34.0 pg   MCHC 35.0  30.0 - 36.0 g/dL   RDW 95.2  84.1 - 32.4 %   Platelets 408 (*) 150 - 400 K/uL   Neutrophils Relative 65  43 - 77 %   Neutro Abs 5.3  1.7 - 7.7 K/uL   Lymphocytes Relative 25  12 - 46 %   Lymphs Abs 2.0  0.7 - 4.0 K/uL   Monocytes  Relative 6  3 - 12 %   Monocytes Absolute 0.5  0.1 - 1.0 K/uL   Eosinophils Relative 3  0 - 5 %   Eosinophils Absolute 0.3  0.0 - 0.7 K/uL   Basophils Relative 1  0 - 1 %   Basophils Absolute 0.0  0.0 - 0.1 K/uL   Smear Review Criteria for review not met    PAP IG W/ RFLX HPV ASCU      Component Value Range   Specimen adequacy:       FINAL DIAGNOSIS:       Cytotechnologist:      INSULIN, FASTING      Component Value Range   Insulin fasting, serum 6  3 - 28 uIU/mL  POCT URINALYSIS DIPSTICK      Component Value Range   Color, UA yellow     Clarity, UA clear     Glucose, UA neg     Bilirubin, UA neg     Ketones, UA neg     Spec Grav, UA 1.025     Blood, UA neg     pH, UA 5.5     Protein, UA neg     Urobilinogen, UA 0.2     Nitrite, UA neg     Leukocytes, UA Negative          Assessment & Plan:  CPE-normal exam Hypothyroid-normal values, continue current dose PCOS-stable-continue metformin Asthma-suboptimal control since she is having to use her inhaler 4-5+times weekly (each time she exercises), I will increase her dose of qvar. Edema-venous insuffiency.  Advised compression stockings.  Continue exercise and elevate legs in the evening.

## 2012-09-18 ENCOUNTER — Other Ambulatory Visit: Payer: Self-pay | Admitting: *Deleted

## 2012-09-18 MED ORDER — BECLOMETHASONE DIPROPIONATE 80 MCG/ACT IN AERS: 1.0000 | INHALATION_SPRAY | RESPIRATORY_TRACT | Status: DC | PRN

## 2012-09-18 MED ORDER — FLUTICASONE PROPIONATE 50 MCG/ACT NA SUSP: 2.0000 | Freq: Every day | NASAL | Status: DC

## 2013-03-11 ENCOUNTER — Other Ambulatory Visit: Payer: Self-pay | Admitting: Physician Assistant

## 2013-06-25 ENCOUNTER — Other Ambulatory Visit: Payer: Self-pay | Admitting: Physician Assistant

## 2013-06-27 ENCOUNTER — Other Ambulatory Visit: Payer: Self-pay

## 2013-06-27 NOTE — Telephone Encounter (Signed)
Chelle, pharmacy sent back request for 90 day RF of levothyroxine 25 mcg. I had just sent in 30 day w/note that pt needs OV for more. Pt scheduled OV w/you for 09/13/13. Can we RF 90 day? I have pended.

## 2013-06-28 ENCOUNTER — Other Ambulatory Visit: Payer: Self-pay | Admitting: Physician Assistant

## 2013-06-30 NOTE — Telephone Encounter (Signed)
Pharm sent req for 90 day supply of metformin 500 mg BID. Pt has appt 09/13/12, can we send 90 days?

## 2013-07-01 MED ORDER — LEVOTHYROXINE SODIUM 25 MCG PO TABS: 25.0000 ug | ORAL_TABLET | Freq: Every day | ORAL | Status: DC

## 2013-07-13 ENCOUNTER — Telehealth: Payer: Self-pay | Admitting: Physician Assistant

## 2013-07-13 NOTE — Telephone Encounter (Signed)
Please call this patient. Received a communication from her insurance company concerned that she had not followed up. She was last seen here for an annual exam, PCOS and hypothyroidism with Georgian Co, PA-C 09/2012. Has she established elsewhere? If not, please encourage her to come in for a follow-up.

## 2013-07-14 NOTE — Telephone Encounter (Signed)
LMOM to CB on both numbers 

## 2013-07-17 NOTE — Telephone Encounter (Signed)
Letter sent, patient has not called back.

## 2013-07-21 ENCOUNTER — Other Ambulatory Visit: Payer: Self-pay | Admitting: Physician Assistant

## 2013-09-13 ENCOUNTER — Encounter: Payer: Managed Care, Other (non HMO) | Admitting: Physician Assistant

## 2013-09-27 ENCOUNTER — Encounter: Payer: Self-pay | Admitting: Physician Assistant

## 2013-09-27 ENCOUNTER — Other Ambulatory Visit: Payer: Self-pay | Admitting: Physician Assistant

## 2013-09-27 ENCOUNTER — Ambulatory Visit (INDEPENDENT_AMBULATORY_CARE_PROVIDER_SITE_OTHER): Payer: Managed Care, Other (non HMO) | Admitting: Physician Assistant

## 2013-09-27 VITALS — BP 130/90 | HR 88 | Temp 98.2°F | Resp 16 | Ht 65.5 in | Wt 175.0 lb

## 2013-09-27 DIAGNOSIS — R51 Headache: Secondary | ICD-10-CM

## 2013-09-27 DIAGNOSIS — Z0000 Encounter for general adult medical examination without abnormal findings: Secondary | ICD-10-CM

## 2013-09-27 DIAGNOSIS — N63 Unspecified lump in unspecified breast: Secondary | ICD-10-CM

## 2013-09-27 DIAGNOSIS — Z124 Encounter for screening for malignant neoplasm of cervix: Secondary | ICD-10-CM

## 2013-09-27 DIAGNOSIS — J683 Other acute and subacute respiratory conditions due to chemicals, gases, fumes and vapors: Secondary | ICD-10-CM

## 2013-09-27 DIAGNOSIS — E039 Hypothyroidism, unspecified: Secondary | ICD-10-CM | POA: Insufficient documentation

## 2013-09-27 DIAGNOSIS — M62838 Other muscle spasm: Secondary | ICD-10-CM

## 2013-09-27 DIAGNOSIS — Z23 Encounter for immunization: Secondary | ICD-10-CM

## 2013-09-27 DIAGNOSIS — J45909 Unspecified asthma, uncomplicated: Secondary | ICD-10-CM

## 2013-09-27 DIAGNOSIS — E282 Polycystic ovarian syndrome: Secondary | ICD-10-CM

## 2013-09-27 LAB — POCT UA - MICROSCOPIC ONLY
BACTERIA, U MICROSCOPIC: NEGATIVE
Casts, Ur, LPF, POC: NEGATIVE
Crystals, Ur, HPF, POC: NEGATIVE
YEAST UA: NEGATIVE

## 2013-09-27 LAB — COMPREHENSIVE METABOLIC PANEL
ALBUMIN: 4.6 g/dL (ref 3.5–5.2)
ALT: 17 U/L (ref 0–35)
AST: 19 U/L (ref 0–37)
Alkaline Phosphatase: 70 U/L (ref 39–117)
BUN: 15 mg/dL (ref 6–23)
CALCIUM: 9.8 mg/dL (ref 8.4–10.5)
CO2: 25 meq/L (ref 19–32)
Chloride: 101 mEq/L (ref 96–112)
Creat: 0.88 mg/dL (ref 0.50–1.10)
Glucose, Bld: 76 mg/dL (ref 70–99)
POTASSIUM: 4 meq/L (ref 3.5–5.3)
SODIUM: 136 meq/L (ref 135–145)
TOTAL PROTEIN: 8 g/dL (ref 6.0–8.3)
Total Bilirubin: 0.5 mg/dL (ref 0.3–1.2)

## 2013-09-27 LAB — CBC WITH DIFFERENTIAL/PLATELET
Basophils Absolute: 0 10*3/uL (ref 0.0–0.1)
Basophils Relative: 0 % (ref 0–1)
Eosinophils Absolute: 0.3 10*3/uL (ref 0.0–0.7)
Eosinophils Relative: 3 % (ref 0–5)
HCT: 44.7 % (ref 36.0–46.0)
Hemoglobin: 15.4 g/dL — ABNORMAL HIGH (ref 12.0–15.0)
Lymphocytes Relative: 21 % (ref 12–46)
Lymphs Abs: 2.3 10*3/uL (ref 0.7–4.0)
MCH: 31.8 pg (ref 26.0–34.0)
MCHC: 34.5 g/dL (ref 30.0–36.0)
MCV: 92.4 fL (ref 78.0–100.0)
Monocytes Absolute: 0.6 10*3/uL (ref 0.1–1.0)
Monocytes Relative: 5 % (ref 3–12)
NEUTROS PCT: 71 % (ref 43–77)
Neutro Abs: 7.6 10*3/uL (ref 1.7–7.7)
PLATELETS: 327 10*3/uL (ref 150–400)
RBC: 4.84 MIL/uL (ref 3.87–5.11)
RDW: 13.5 % (ref 11.5–15.5)
WBC: 10.7 10*3/uL — ABNORMAL HIGH (ref 4.0–10.5)

## 2013-09-27 LAB — TSH: TSH: 1.564 u[IU]/mL (ref 0.350–4.500)

## 2013-09-27 LAB — LIPID PANEL
CHOLESTEROL: 150 mg/dL (ref 0–200)
HDL: 73 mg/dL (ref >39)
LDL Cholesterol: 69 mg/dL (ref 0–99)
Total CHOL/HDL Ratio: 2.1 Ratio
Triglycerides: 42 mg/dL (ref <150)
VLDL: 8 mg/dL (ref 0–40)

## 2013-09-27 LAB — POCT URINALYSIS DIPSTICK
BILIRUBIN UA: NEGATIVE
Blood, UA: NEGATIVE
GLUCOSE UA: NEGATIVE
Ketones, UA: NEGATIVE
Leukocytes, UA: NEGATIVE
Nitrite, UA: NEGATIVE
Protein, UA: NEGATIVE
SPEC GRAV UA: 1.025
UROBILINOGEN UA: 0.2
pH, UA: 6

## 2013-09-27 LAB — POCT GLYCOSYLATED HEMOGLOBIN (HGB A1C): HEMOGLOBIN A1C: 4.7

## 2013-09-27 LAB — GLUCOSE, POCT (MANUAL RESULT ENTRY): POC GLUCOSE: 76 mg/dL (ref 70–99)

## 2013-09-27 MED ORDER — CYCLOBENZAPRINE HCL 10 MG PO TABS: 10.0000 mg | ORAL_TABLET | Freq: Every day | ORAL | Status: DC

## 2013-09-27 NOTE — Progress Notes (Signed)
Subjective:    Patient ID: Erin Warren, female    DOB: 04-04-1976, 38 y.o.   MRN: [id number]  HPI  Mrs. Erin Warren is a 38 year old female who presents to clinic today for a CPE.   Has had intermittent numbness in thumb for past couple of weeks - no injury she can recall.  Occasional bilateral knee pain after lots of walking - usually will go away after resting for a few hours.  Muscle spasms in upper back and shoulders for which she takes naproxen with some relief - she attributes them to sleeping position. Spasms worsens when she has URI or asthma flare because she sleeps in different positions to improve her breathing.  Has very frequent headaches (2-3/week) - usually along the frontal lobe and occasionally across the temporal lobe. Does not think light specifically bothers HA, but has recently started wearing sunglasses when she gets headache which seems to help. Takes naproxen for HA which allows her to function but doesn't resolve HA completely.  Exercises 5 days per week for approximately 1 hour and incorporates lots of fruits, vegetables and whole grains into diet. Is still trying to lose weight.   Allergies seem to be uncontrolled. Neighbor frequently cooks with garlic and onion that drifts through air vents and aggravates her allergies. Typically only has to use inhaler prior to exercise.   Denies vision changes, chest pain, palpitations, SOB, abdominal pain, nausea, vomiting, constipation, diarrhea, polydipsia, polyphagia, polyuria, dysuria, urinary incontinence, changes in bowel movements, arthralgias, dizziness pr lightheadedness.   Sees eye specialist and dentist regularly.   Review of Systems     Objective:   Physical Exam  Constitutional: She is oriented to person, place, and time. She appears well-developed and well-nourished.  HENT:  Head: Normocephalic and atraumatic.  Right Ear: Hearing, tympanic membrane, external ear and ear canal normal.  Left Ear: Hearing,  tympanic membrane, external ear and ear canal normal.  Nose: Nose normal.  Mouth/Throat: Uvula is midline, oropharynx is clear and moist and mucous membranes are normal.  Eyes: Conjunctivae, EOM and lids are normal. Pupils are equal, round, and reactive to light.  Neck: Normal range of motion. Neck supple.  Cardiovascular: Normal rate, regular rhythm, normal heart sounds and intact distal pulses.   Pulmonary/Chest: Effort normal and breath sounds normal.  Genitourinary: Vagina normal. No breast swelling, tenderness or discharge. There is no rash or tenderness on the right labia. There is no rash or tenderness on the left labia.  Firm, breast mass present in left lower breast at approximately 4 o'clock. No symmetrical masses felt in right breast.   Musculoskeletal:       Right shoulder: Normal.       Left shoulder: Normal.       Right elbow: Normal.      Left elbow: Normal.       Right wrist: Normal.       Left wrist: Normal.  Lymphadenopathy:       Head (right side): No submental, no submandibular, no tonsillar, no preauricular, no posterior auricular and no occipital adenopathy present.       Head (left side): No submental, no submandibular, no tonsillar, no preauricular, no posterior auricular and no occipital adenopathy present.    She has no cervical adenopathy.       Right: No inguinal and no supraclavicular adenopathy present.       Left: No inguinal and no supraclavicular adenopathy present.  Neurological: She is alert and oriented to  person, place, and time. She has normal strength. No cranial nerve deficit.  Reflex Scores:      Tricep reflexes are 2+ on the right side and 2+ on the left side.      Bicep reflexes are 2+ on the right side and 2+ on the left side.      Brachioradialis reflexes are 2+ on the right side and 2+ on the left side.      Patellar reflexes are 2+ on the right side and 2+ on the left side.      Achilles reflexes are 2+ on the right side and 2+ on the left  side. Skin: Skin is warm and dry. No rash noted.  Psychiatric: She has a normal mood and affect. Her speech is normal and behavior is normal. Thought content normal. Cognition and memory are normal.       Assessment & Plan:    1. Routine general medical examination at a health care facility - POCT UA - Microscopic Only - POCT urinalysis dipstick - CBC with Differential - Lipid panel  2. Need for prophylactic vaccination and inoculation against influenza - Flu Vaccine QUAD 36+ mos IM  3. Hypothyroidism Continue medications as prescribed.  - TSH  4. Reactive airways dysfunction syndrome Take Claritin every morning for allergies. If no improvement, will add Singulair and consider referral to allergist.   5. POLYCYSTIC OVARIAN DISEASE Continue medications as prescribed.  - Comprehensive metabolic panel - POCT glucose (manual entry) - POCT glycosylated hemoglobin (Hb A1C)  6. Screening for cervical cancer Will call with results of pap. If negative, will not need another pap for 5 years. If positive, refer for colposcopy.  - Pap IG and HPV (high risk) DNA detection  7. Breast mass Mass noted in left breast. Re-evaluate in 2 weeks.   8. Muscle spasm Take Flexeril at night for muscle spasms. - cyclobenzaprine (FLEXERIL) 10 MG tablet; Take 1 tablet (10 mg total) by mouth at bedtime.  Dispense: 30 tablet; Refill: 0  9. Headache(784.0) Headaches most likely due to allergies. If no improvement with Claritin daily, will consider head imaging. Re-evaluate in 2 weeks.

## 2013-09-27 NOTE — Patient Instructions (Addendum)
We will let you know the results of her labs as soon as they are available. If you have not heard from [name] in 2 weeks, please contact us.  The fastest way to get your results is to activate you MyChart account. The instructions are on the last page of this handout.  Take Claritin every morning for allergy relief.    Keeping You Healthy  Get These Tests 1. Blood Pressure- Have your blood pressure checked once a year by your health care provider.  Normal blood pressure is 120/80. 2. Weight- Have your body mass index (BMI) calculated to screen for obesity.  BMI is measure of body fat based on height and weight.  You can also calculate your own BMI at GravelBags.it. 3. Cholesterol- Have your cholesterol checked every 5 years starting at age 80 then yearly starting at age 6. 66. Chlamydia, HIV, and other sexually transmitted diseases- Get screened every year until age 70, then within three months of each new sexual provider. 5. Pap Smear- Every 1-3 years; discuss with your health care provider. 6. Mammogram- Every year starting at age 45  Take these medicines  Calcium with Vitamin D-Your body needs 1200 mg of Calcium each day and [phone] IU of Vitamin D daily.  Your body can only absorb 500 mg of Calcium at a time so Calcium must be taken in 2 or 3 divided doses throughout the day.  Multivitamin with folic acid- Once daily if it is possible for you to become pregnant.  Get these Immunizations  Gardasil-Series of three doses; prevents HPV related illness such as genital warts and cervical cancer.  Menactra-Single dose; prevents meningitis.  Tetanus shot- Every 10 years.  Flu shot-Every year.  Take these steps 1. Do not smoke-Your healthcare provider can help you quit.  For tips on how to quit go to www.smokefree.gov or call 1-800 QUITNOW. 2. Be physically active- Exercise 5 days a week for at least 30 minutes.  If you are not already physically active, start slow and gradually  work up to 30 minutes of moderate physical activity.  Examples of moderate activity include walking briskly, dancing, swimming, bicycling, etc. 3. Breast Cancer- A self breast exam every month is important for early detection of breast cancer.  For more information and instruction on self breast exams, ask your healthcare provider or https://www.patel.info/. 4. Eat a healthy diet- Eat a variety of healthy foods such as fruits, vegetables, whole grains, low fat milk, low fat cheeses, yogurt, lean meats, poultry and fish, beans, nuts, tofu, etc.  For more information go to www. Thenutritionsource.org 5. Drink alcohol in moderation- Limit alcohol intake to one drink or less per day. Never drink and drive. 6. Depression- Your emotional health is as important as your physical health.  If you're feeling down or losing interest in things you normally enjoy please talk to your healthcare provider about being screened for depression. 7. Dental visit- Brush and floss your teeth twice daily; visit your dentist twice a year. 8. Eye doctor- Get an eye exam at least every 2 years. 9. Helmet use- Always wear a helmet when riding a bicycle, motorcycle, rollerblading or skateboarding. 37. Safe sex- If you may be exposed to sexually transmitted infections, use a condom. 11. Seat belts- Seat belts can save your live; always wear one. 12. Smoke/Carbon Monoxide detectors- These detectors need to be installed on the appropriate level of your home. Replace batteries at least once a year. 13. Skin cancer- When out in the sun  please cover up and use sunscreen 15 SPF or higher. 14. Violence- If anyone is threatening or hurting you, please tell your healthcare provider.

## 2013-09-27 NOTE — Progress Notes (Signed)
I have examined this patient along with the student and agree.  We discussed imaging of the breast, but as the patient it mid-cycle, and her finances are tight, she would like to have re-examination here in 2 weeks before prceeding with imaging.  If irregularity persists in the LEFT breast (5 o'clock-6 o'clock) will schedule for [name].

## 2013-09-27 NOTE — Progress Notes (Signed)
   Subjective:    Patient ID: Erin Warren, female    DOB: 1976/01/08, 38 y.o.   MRN: [id number]  HPI    Review of Systems  Constitutional: Negative.   HENT: Positive for postnasal drip and sinus pressure.   Eyes: Negative.   Respiratory: Negative.   Cardiovascular: Negative.   Gastrointestinal: Negative.   Endocrine: Negative.   Genitourinary: Negative.   Musculoskeletal: Positive for arthralgias.  Skin: Negative.   Allergic/Immunologic: Positive for environmental allergies and food allergies.  Neurological: Positive for numbness.  Hematological: Negative.   Psychiatric/Behavioral: Negative.        Objective:   Physical Exam        Assessment & Plan:

## 2013-09-28 LAB — PAP IG AND HPV HIGH-RISK: HPV DNA High Risk: NOT DETECTED

## 2013-09-29 ENCOUNTER — Encounter: Payer: Self-pay | Admitting: Physician Assistant

## 2013-10-11 ENCOUNTER — Encounter: Payer: Self-pay | Admitting: Physician Assistant

## 2013-10-11 ENCOUNTER — Ambulatory Visit (INDEPENDENT_AMBULATORY_CARE_PROVIDER_SITE_OTHER): Payer: Managed Care, Other (non HMO) | Admitting: Physician Assistant

## 2013-10-11 VITALS — BP 128/88 | HR 90 | Temp 98.2°F | Resp 16 | Ht 65.5 in | Wt 177.0 lb

## 2013-10-11 DIAGNOSIS — N63 Unspecified lump in unspecified breast: Secondary | ICD-10-CM

## 2013-10-11 DIAGNOSIS — R51 Headache: Secondary | ICD-10-CM

## 2013-10-11 NOTE — Progress Notes (Signed)
   Subjective:    Patient ID: Erin Warren, female    DOB: [birth year], 38 y.o.   MRN: [id number]  HPI  Patient presents for follow up for HA and recheck left breast.   Reports headaches seems to have slightly improved - but she is still having pain in front of head and still occuring daily.  She is wondering if perhaps the light may be bothering her eyes because she noticed that when she was here with the husband yesterday she developed a HA and there are also fluorescent lights at work where she experiences frequent headaches. She has not tried any medication. Reports mild, occasional lightheadedness. No dizziness.  Has also been experiencing maxillary sinus pressure. Denies congestion, dental pain, ear pain and sore throat.   Reports mild breast tenderness in left breast. May have improved slightly since last visit. LMP was 10/02/13.     Review of Systems   As above.     Objective:   Physical Exam  Constitutional: She is oriented to person, place, and time. She appears well-developed and well-nourished.  HENT:  Head: Normocephalic and atraumatic.  Eyes: Conjunctivae are normal.  Cardiovascular: Normal rate, regular rhythm and normal heart sounds.   Pulmonary/Chest: Effort normal and breath sounds normal. Right breast exhibits no mass and no tenderness. Left breast exhibits no mass and no tenderness.  Breasts were non-tender and symmetrical with no masses or lesions.   Lymphadenopathy:    She has no cervical adenopathy.       Right: No supraclavicular adenopathy present.       Left: No supraclavicular adenopathy present.  Neurological: She is alert and oriented to person, place, and time.  Skin: Skin is warm and dry.  Psychiatric: She has a normal mood and affect. Her behavior is normal. Judgment and thought content normal.        Assessment & Plan:   1. Headache(784.0) Refer to neurology for further evaluation and consideration of appropriate imaging.  - Ambulatory  referral to Neurology  2. Breast mass Perform SBE about 1 week after completion of menstrual cycle each month. Return if breasts do not feel symmetrical or feel abnormal mass.

## 2013-10-11 NOTE — Patient Instructions (Signed)
If you have not heard anything regarding the referral in 2 weeks, please contact our office.

## 2013-10-12 NOTE — Progress Notes (Signed)
I have examined this patient along with the student and agree.  I suspect the breast lump was a cyst, as it is no longer palpable mid-cycle.  She is encouraged to perform monthly self-breast exams and report any concerning findings.  We will discuss the need for mammography at her CPE this summer.  She will be 38 in July and I would consider mammography again at that point, though the most recent evidence indicates that screening without signs/symptoms should be delayed until 34.

## 2013-10-17 ENCOUNTER — Encounter: Payer: Self-pay | Admitting: Diagnostic Neuroimaging

## 2013-10-17 ENCOUNTER — Ambulatory Visit (INDEPENDENT_AMBULATORY_CARE_PROVIDER_SITE_OTHER): Payer: Managed Care, Other (non HMO) | Admitting: Diagnostic Neuroimaging

## 2013-10-17 VITALS — BP 135/97 | HR 98 | Ht 66.0 in | Wt 176.5 lb

## 2013-10-17 DIAGNOSIS — G43109 Migraine with aura, not intractable, without status migrainosus: Secondary | ICD-10-CM

## 2013-10-17 MED ORDER — TOPIRAMATE 50 MG PO TABS: 50.0000 mg | ORAL_TABLET | Freq: Two times a day (BID) | ORAL | Status: DC

## 2013-10-17 NOTE — Progress Notes (Signed)
[facility]  PATIENT: Erin Warren DOB: 1976/03/28  REFERRING CLINICIAN: Dellis Filbert HISTORY FROM: patient (and partner, who is also patient of mine - Starling Manns) REASON FOR VISIT: new consult    HISTORICAL  CHIEF COMPLAINT:  Chief Complaint  Patient presents with  . Headache    HISTORY OF PRESENT ILLNESS:   38 year old right-handed female with history polycystic ovarian disease, teratoma, asthma, hypertension, here for evaluation of headaches.  Patient has had intermittent headaches since age 38 years old. Typically they are pressure, frontal and bitemporal headaches, one per month lasting one hour at a time. Sometimes she has nausea, fatigue, sees sparkles of light with the bad headaches. Vision tends to be photosensitive generally speaking. No photophobia. Sometimes she has shoulder and neck pain, sometimes sinus congestion with these headaches.  Over past 6-7 months patient has had change in her headaches. Now they are more severe and more frequent. Now she's having headaches every 2-3 days, lasting hours or one day at a time. She's been using naproxen 2-3 times per week to help with headaches. Triggering factors include menstrual cycle, change in weather, stress.  Family history positive for migraine in patient's maternal aunt. Patient's mother has similar headaches as well but not officially diagnosed with migraine.  REVIEW OF SYSTEMS: Full 14 system review of systems performed and notable only for fatigue swelling in legs allergies runny nose not in a sleep restless legs headaches. In frequent dizziness.  ALLERGIES: No Known Allergies  HOME MEDICATIONS: Outpatient Prescriptions Prior to Visit  Medication Sig Dispense Refill  . albuterol (PROVENTIL HFA;VENTOLIN HFA) 108 (90 BASE) MCG/ACT inhaler Inhale 2 puffs into the lungs every 6 (six) hours as needed.  1 Inhaler  0  . Calcium Carbonate-Vitamin D (CALCIUM-VITAMIN D) 500-200 MG-UNIT per tablet Take 1  tablet by mouth daily.      . fluticasone (FLONASE) 50 MCG/ACT nasal spray Place 2 sprays into the nose daily.  48 g  3  . levothyroxine (SYNTHROID, LEVOTHROID) 25 MCG tablet Take 1 tablet (25 mcg total) by mouth daily before breakfast. PATIENT NEEDS OFFICE VISIT FOR ADDITIONAL REFILLS  90 tablet  0  . metFORMIN (GLUCOPHAGE) 500 MG tablet TAKE 1 TABLET TWICE A DAY  30 tablet  0  . metFORMIN (GLUCOPHAGE) 500 MG tablet TAKE 1 TABLET (500 MG TOTAL) BY MOUTH 2 (TWO) TIMES DAILY WITH A MEAL.  180 tablet  1  . OVER THE COUNTER MEDICATION daily.      [name] QVAR 80 MCG/ACT inhaler INHALE 1 PUFF INTO THE LUNGS AS NEEDED.  26.1 g  5  . fluticasone (FLONASE) 50 MCG/ACT nasal spray PLACE 2 SPRAYS INTO THE NOSE DAILY.  16 g  11  . cyclobenzaprine (FLEXERIL) 10 MG tablet Take 1 tablet (10 mg total) by mouth at bedtime.  30 tablet  0  . glucosamine-chondroitin 500-400 MG tablet Take 1 tablet by mouth 2 (two) times daily.      [name] lactobacillus acidophilus (BACID) TABS Take 1 tablet by mouth daily.      [name] levothyroxine (SYNTHROID, LEVOTHROID) 25 MCG tablet TAKE 1 TABLET (25 MCG TOTAL) BY MOUTH DAILY.  90 tablet  1  . metFORMIN (GLUCOPHAGE) 500 MG tablet Take 1 tablet (500 mg total) by mouth 2 (two) times daily with a meal. PATIENT NEEDS OFFICE VISIT FOR ADDITIONAL REFILLS  60 tablet  0  . metFORMIN (GLUCOPHAGE) 500 MG tablet Take 1 tablet (500 mg total) by mouth 2 (two) times daily with a meal. PATIENT  NEEDS OFFICE VISIT FOR ADDITIONAL REFILLS - 2nd NOTICE  30 tablet  0   No facility-administered medications prior to visit.    PAST MEDICAL HISTORY: Past Medical History  Diagnosis Date  . Allergy   . Thyroid disease   . Hypertension   . Asthma   . Polycystic ovarian syndrome     PAST SURGICAL HISTORY: Past Surgical History  Procedure Laterality Date  . Teratoma excision    . Tubal ligation      FAMILY HISTORY: Family History  Problem Relation Age of Onset  . Thyroid disease Mother   . Allergies  Mother   . Hypertension Father   . Heart disease Father   . Alcohol abuse Maternal Grandfather   . Hypertension Paternal Grandmother   . Hypertension Paternal Grandfather   . Diabetes Paternal Grandfather     SOCIAL HISTORY:  History   Social History  . Marital Status: Single    Spouse Name: N/A    Number of Children: 0  . Years of Education: College gr   Occupational History  .  [employer]   Social History Main Topics  . Smoking status: Never Smoker   . Smokeless tobacco: Never Used  . Alcohol Use: 0.6 oz/week    1 Glasses of wine per week     Comment: once a month  . Drug Use: No  . Sexual Activity: Yes    Birth Control/ Protection: Surgical   Other Topics Concern  . Not on file   Social History Narrative   Patient lives at home with domestic partner.   Caffeine Use: 3 cups daily     PHYSICAL EXAM  Filed Vitals:   10/17/13 0949  BP: 135/97  Pulse: 98  Height: 5\' 6"  (1.676 m)  Weight: 176 lb 8 oz (80.06 kg)    Not recorded    Body mass index is 28.5 kg/(m^2).  GENERAL EXAM: Patient is in no distress; well developed, nourished and groomed; neck is supple  CARDIOVASCULAR: Regular rate and rhythm, no murmurs, no carotid bruits  NEUROLOGIC: MENTAL STATUS: awake, alert, oriented to person, place and time, recent and remote memory intact, normal attention and concentration, language fluent, comprehension intact, naming intact, fund of knowledge appropriate CRANIAL NERVE: no papilledema on fundoscopic exam, pupils equal and reactive to light, visual fields full to confrontation, extraocular muscles intact, no nystagmus, facial sensation and strength symmetric, hearing intact, palate elevates symmetrically, uvula midline, shoulder shrug symmetric, tongue midline. MOTOR: normal bulk and tone, full strength in the BUE, BLE SENSORY: normal and symmetric to light touch, temperature, vibration COORDINATION: finger-nose-finger, fine finger movements  normal REFLEXES: deep tendon reflexes present and symmetric GAIT/STATION: narrow based gait; able to walk tandem; romberg is negative    DIAGNOSTIC DATA (LABS, IMAGING, TESTING) - I reviewed patient records, labs, notes, testing and imaging myself where available.  Lab Results  Component Value Date   WBC 10.7* 09/27/2013   HGB 15.4* 09/27/2013   HCT 44.7 09/27/2013   MCV 92.4 09/27/2013   PLT 327 09/27/2013      Component Value Date/Time   NA 136 09/27/2013 1506   K 4.0 09/27/2013 1506   CL 101 09/27/2013 1506   CO2 25 09/27/2013 1506   GLUCOSE 76 09/27/2013 1506   BUN 15 09/27/2013 1506   CREATININE 0.88 09/27/2013 1506   CREATININE 0.76 01/04/2007 2027   CALCIUM 9.8 09/27/2013 1506   PROT 8.0 09/27/2013 1506   ALBUMIN 4.6 09/27/2013 1506   AST 19  09/27/2013 1506   ALT 17 09/27/2013 1506   ALKPHOS 70 09/27/2013 1506   BILITOT 0.5 09/27/2013 1506   Lab Results  Component Value Date   CHOL 150 09/27/2013   HDL 73 09/27/2013   LDLCALC 69 09/27/2013   TRIG 42 09/27/2013   CHOLHDL 2.1 09/27/2013   Lab Results  Component Value Date   HGBA1C 4.7 09/27/2013   No results found for this basename: [id number]   Lab Results  Component Value Date   TSH 1.564 09/27/2013      ASSESSMENT AND PLAN  38 y.o. year old female here with history of headaches since age 60 years old, with worsening severity and frequency of headaches over past 6-7 months. Headaches have some migraine features (nausea, long duration, photosensitivity, seeing sparkling lights). However with change in severity and type of headaches, we'll proceed with neuroimaging study. Also will start her on topiramate for migraine prevention. She can continue naproxen as needed for breakthrough headache.   Orders Placed This Encounter  Procedures  . MR Brain W Wo Contrast    Return in about 3 months (around 01/14/2014) for with Charlott Holler or Penumalli.    Penni Bombard, MD [id number], [id number] AM Certified in Neurology,  Neurophysiology and Neuroimaging  [facility] [address], [place], [place] [address] [phone]

## 2013-10-17 NOTE — Patient Instructions (Signed)
Start topiramate 50mg  at bedtime. After 2 weeks, increase to twice per day.   Drink plenty of water.

## 2013-10-26 ENCOUNTER — Telehealth: Payer: Self-pay | Admitting: Diagnostic Neuroimaging

## 2013-10-26 NOTE — Telephone Encounter (Signed)
Patient's husband calling to state that he thinks patient may be having a reaction to the Topamax medication, today her hands started tingling and are sensitive to warm weather and also her face and lips are tingling. Please call and advise.

## 2013-10-29 MED ORDER — PROPRANOLOL HCL ER 60 MG PO CP24: 60.0000 mg | ORAL_CAPSULE | Freq: Every day | ORAL | Status: DC

## 2013-10-29 NOTE — Telephone Encounter (Signed)
I called patient. No answer. Agree with stopping topiramate. I will send in rx for propranolol. Let patient know. -VRP

## 2013-10-29 NOTE — Telephone Encounter (Signed)
I called and spoke to Legrand Como, her husband.  Pt has had SE topamax (paresthesia, lips burning, top of head turning red, hands tingling).  She has tapered to 1 tab daily.   I told him that she can stop.   I would relay to Dr. Leta Baptist.  Placed in allergy list as a hypersensitivity.   Try something else?

## 2013-10-29 NOTE — Telephone Encounter (Signed)
Called patient and spoke with patient's husband Erin Warren concerning Dr. Leta Baptist stopping the patient's topiramate and sending in an Rx for propranolol. I advised the patient's husband that if the patient has any other problems, questions or concerns to call the office. Patient's husband verbalized understanding.

## 2013-10-31 ENCOUNTER — Ambulatory Visit (INDEPENDENT_AMBULATORY_CARE_PROVIDER_SITE_OTHER): Payer: Managed Care, Other (non HMO)

## 2013-10-31 DIAGNOSIS — G43109 Migraine with aura, not intractable, without status migrainosus: Secondary | ICD-10-CM

## 2013-10-31 MED ORDER — GADOPENTETATE DIMEGLUMINE 469.01 MG/ML IV SOLN: 16.0000 mL | Freq: Once | INTRAVENOUS | Status: AC | PRN

## 2013-11-02 ENCOUNTER — Telehealth: Payer: Self-pay | Admitting: Diagnostic Neuroimaging

## 2013-11-02 DIAGNOSIS — J323 Chronic sphenoidal sinusitis: Secondary | ICD-10-CM

## 2013-11-02 NOTE — Telephone Encounter (Signed)
I called patient. Doing better with propranolol than topiramate. Reviewed MRI results. Will check CT sinus study to follow up sphenoid sinus findings.  Penni Bombard, MD 7/97/2820, 6:01 PM Certified in Neurology, Neurophysiology and Neuroimaging  [facility] [address], [place], [place] [address] [phone]

## 2013-11-14 ENCOUNTER — Ambulatory Visit
Admission: RE | Admit: 2013-11-14 | Discharge: 2013-11-14 | Disposition: A | Discharge status: Home / Self Care | Payer: Managed Care, Other (non HMO) | Source: Ambulatory Visit | Attending: Diagnostic Neuroimaging | Admitting: Diagnostic Neuroimaging

## 2013-11-14 DIAGNOSIS — J323 Chronic sphenoidal sinusitis: Secondary | ICD-10-CM

## 2013-11-26 ENCOUNTER — Telehealth: Payer: Self-pay | Admitting: Diagnostic Neuroimaging

## 2013-11-26 NOTE — Telephone Encounter (Signed)
I called patient and husband. Gave results. 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 This is likely an incidental finding. HA are slightly improved on TPX.   PLAN: - Continue migraine treatments.  Penni Bombard, MD 2/48/2500, 3:70 PM Certified in Neurology, Neurophysiology and Neuroimaging  [facility] [address] [place], [place] [address] [phone]

## 2013-11-26 NOTE — Telephone Encounter (Signed)
Husband is calling regarding CT results

## 2013-11-26 NOTE — Telephone Encounter (Signed)
Pt is calling requesting CT scan results. Please advise

## 2013-12-24 ENCOUNTER — Other Ambulatory Visit: Payer: Self-pay

## 2013-12-24 MED ORDER — PROPRANOLOL HCL ER 60 MG PO CP24: 60.0000 mg | ORAL_CAPSULE | Freq: Every day | ORAL | Status: DC

## 2013-12-24 NOTE — Telephone Encounter (Signed)
Pharmacy faxed [name] and said ins requires 90 day Rx.

## 2014-01-15 ENCOUNTER — Ambulatory Visit: Payer: Managed Care, Other (non HMO) | Admitting: Nurse Practitioner

## 2014-01-22 ENCOUNTER — Encounter: Payer: Self-pay | Admitting: Nurse Practitioner

## 2014-01-22 ENCOUNTER — Ambulatory Visit (INDEPENDENT_AMBULATORY_CARE_PROVIDER_SITE_OTHER): Payer: Managed Care, Other (non HMO) | Admitting: Nurse Practitioner

## 2014-01-22 VITALS — BP 127/87 | HR 74 | Ht 66.0 in | Wt 190.0 lb

## 2014-01-22 DIAGNOSIS — G43109 Migraine with aura, not intractable, without status migrainosus: Secondary | ICD-10-CM | POA: Insufficient documentation

## 2014-01-22 NOTE — Progress Notes (Signed)
PATIENT: Erin Warren DOB: 03-02-1976  REASON FOR VISIT: follow up for Migraine HISTORY FROM: patient  HISTORY OF PRESENT ILLNESS: 38 year old right-handed female with history polycystic ovarian disease, teratoma, asthma, hypertension, here for evaluation of headaches.  Patient has had intermittent headaches since age 62 years old. Typically they are pressure, frontal and bitemporal headaches, one per month lasting one hour at a time. Sometimes she has nausea, fatigue, sees sparkles of light with the bad headaches. Vision tends to be photosensitive generally speaking. No phonophobia. Sometimes she has shoulder and neck pain, sometimes sinus congestion with these headaches.  Over past 6-7 months patient has had change in her headaches. Now they are more severe and more frequent. Now she's having headaches every 2-3 days, lasting hours or one day at a time. She's been using naproxen 2-3 times per week to help with headaches. Triggering factors include menstrual cycle, change in weather, stress.  Family history positive for migraine in patient's maternal aunt. Patient's mother has similar headaches as well but not officially diagnosed with migraine.   UPDATE 01/22/14 (LL): She could not tolerate SE of Topamax and was changed to Propranolol with benefit.  Headaches are reduced to 2-3 per week instead of almost every day.  Headaches have not been as incapacitating; she is able to function better now.  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 This is likely an incidental finding.   REVIEW OF SYSTEMS: Full 14 system review of systems performed and notable only for fatigue, light sensitivity, allergies, restless legs, headaches  ALLERGIES: Allergies  Allergen Reactions  . Topamax [Topiramate] Other (See Comments)    Flushing, paresthesias (tingling, burning).      HOME MEDICATIONS: Outpatient Prescriptions Prior to Visit    Medication Sig Dispense Refill  . albuterol (PROVENTIL HFA;VENTOLIN HFA) 108 (90 BASE) MCG/ACT inhaler Inhale 2 puffs into the lungs every 6 (six) hours as needed.  1 Inhaler  0  . fluticasone (FLONASE) 50 MCG/ACT nasal spray Place 2 sprays into the nose daily.  48 g  3  . lactobacillus acidophilus (BACID) TABS tablet Take 1 tablet by mouth daily.      [name] levothyroxine (SYNTHROID, LEVOTHROID) 25 MCG tablet Take 1 tablet (25 mcg total) by mouth daily before breakfast. PATIENT NEEDS OFFICE VISIT FOR ADDITIONAL REFILLS  90 tablet  0  . metFORMIN (GLUCOPHAGE) 500 MG tablet TAKE 1 TABLET TWICE A DAY  30 tablet  0  . metFORMIN (GLUCOPHAGE) 500 MG tablet TAKE 1 TABLET (500 MG TOTAL) BY MOUTH 2 (TWO) TIMES DAILY WITH A MEAL.  180 tablet  1  . OVER THE COUNTER MEDICATION daily. Claritin qd      . propranolol ER (INDERAL LA) 60 MG 24 hr capsule Take 1 capsule (60 mg total) by mouth daily.  90 capsule  1  . QVAR 80 MCG/ACT inhaler INHALE 1 PUFF INTO THE LUNGS AS NEEDED.  26.1 g  5  . Calcium Carbonate-Vitamin D (CALCIUM-VITAMIN D) 500-200 MG-UNIT per tablet Take 1 tablet by mouth daily.       No facility-administered medications prior to visit.     PHYSICAL EXAM  Filed Vitals:   01/22/14 1012  BP: 127/87  Pulse: 74  Height: 5\' 6"  (1.676 m)  Weight: 190 lb (86.183 kg)   Body mass index is 30.68 kg/(m^2). No exam data present   Generalized: Well developed, in no acute distress  Head: normocephalic and atraumatic. Oropharynx benign  Neck: Supple, no carotid bruits  Cardiac: Regular rate rhythm, no murmur  Musculoskeletal: No deformity   NEUROLOGIC:  MENTAL STATUS: awake, alert, oriented to person, place and time, recent and remote memory intact, normal attention and concentration, language fluent, comprehension intact, naming intact, fund of knowledge appropriate  CRANIAL NERVE: no papilledema on fundoscopic exam, pupils equal and reactive to light, visual fields full to confrontation,  extraocular muscles intact, no nystagmus, facial sensation and strength symmetric, hearing intact, palate elevates symmetrically, uvula midline, shoulder shrug symmetric, tongue midline.  MOTOR: normal bulk and tone, full strength in the BUE, BLE  SENSORY: normal and symmetric to light touch, temperature, vibration  COORDINATION: finger-nose-finger, fine finger movements normal  REFLEXES: deep tendon reflexes present and symmetric  GAIT/STATION: narrow based gait; able to walk tandem; romberg is negative  DIAGNOSTIC DATA (LABS, IMAGING, TESTING) - I reviewed patient records, labs, notes, testing and imaging myself where available.  Lab Results  Component Value Date   HGBA1C 4.7 09/27/2013   Lab Results  Component Value Date   TSH 1.564 09/27/2013   ASSESSMENT AND PLAN 38 y.o. year old female here with history of headaches since age 54 years old, with worsening severity and frequency of headaches over past 6-7 months. Headaches have some migraine features (nausea, long duration, photosensitivity, seeing sparkling lights). She could not tolerate SE of Topamax and was changed to Propranolol with benefit. Headaches are reduced by 75%.  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 This is likely an incidental finding.   PLAN: Continue Inderal LA for Migraine prevention. Continue to use Naproxen and Tylenol as needed for acute headache. Follow up in 6 months, sooner as needed.  Philmore Pali, MSN, NP-C 01/22/2014, 10:25 AM [facility] [address], [place],  [address] [phone]  Note: This document was prepared with digital dictation and possible smart phrase technology. Any transcriptional errors that result from this process are unintentional.

## 2014-01-22 NOTE — Patient Instructions (Signed)
Continue Inderal LA for Migraine prevention at current dose.  Continue to use Naproxen and Tylenol as needed for acute headache.  Follow up in 6 months, sooner as needed.

## 2014-02-15 NOTE — Progress Notes (Signed)
I reviewed note and agree with plan.   VIKRAM R. PENUMALLI, MD  Certified in Neurology, Neurophysiology and Neuroimaging  [facility] [address], [address] [place], [place] [address] [phone]   

## 2014-03-24 ENCOUNTER — Other Ambulatory Visit: Payer: Self-pay | Admitting: Physician Assistant

## 2014-03-27 ENCOUNTER — Other Ambulatory Visit: Payer: Self-pay | Admitting: Physician Assistant

## 2014-03-29 ENCOUNTER — Ambulatory Visit (INDEPENDENT_AMBULATORY_CARE_PROVIDER_SITE_OTHER): Payer: Managed Care, Other (non HMO)

## 2014-03-29 ENCOUNTER — Ambulatory Visit (INDEPENDENT_AMBULATORY_CARE_PROVIDER_SITE_OTHER): Payer: Managed Care, Other (non HMO) | Admitting: Emergency Medicine

## 2014-03-29 VITALS — BP 124/84 | HR 82 | Temp 98.6°F | Resp 16 | Ht 65.25 in | Wt 194.8 lb

## 2014-03-29 DIAGNOSIS — M25562 Pain in left knee: Secondary | ICD-10-CM

## 2014-03-29 DIAGNOSIS — S61409A Unspecified open wound of unspecified hand, initial encounter: Secondary | ICD-10-CM

## 2014-03-29 DIAGNOSIS — M21619 Bunion of unspecified foot: Secondary | ICD-10-CM | POA: Insufficient documentation

## 2014-03-29 DIAGNOSIS — S61402A Unspecified open wound of left hand, initial encounter: Secondary | ICD-10-CM

## 2014-03-29 DIAGNOSIS — E282 Polycystic ovarian syndrome: Secondary | ICD-10-CM

## 2014-03-29 DIAGNOSIS — E039 Hypothyroidism, unspecified: Secondary | ICD-10-CM

## 2014-03-29 DIAGNOSIS — M25569 Pain in unspecified knee: Secondary | ICD-10-CM

## 2014-03-29 DIAGNOSIS — L03114 Cellulitis of left upper limb: Secondary | ICD-10-CM

## 2014-03-29 DIAGNOSIS — IMO0002 Reserved for concepts with insufficient information to code with codable children: Secondary | ICD-10-CM

## 2014-03-29 MED ORDER — METFORMIN HCL 500 MG PO TABS: 500.0000 mg | ORAL_TABLET | Freq: Two times a day (BID) | ORAL | Status: DC

## 2014-03-29 MED ORDER — AMOXICILLIN-POT CLAVULANATE 875-125 MG PO TABS: 1.0000 | ORAL_TABLET | Freq: Two times a day (BID) | ORAL | Status: AC

## 2014-03-29 MED ORDER — LEVOTHYROXINE SODIUM 25 MCG PO TABS: 25.0000 ug | ORAL_TABLET | Freq: Every day | ORAL | Status: DC

## 2014-03-29 MED ORDER — MELOXICAM 15 MG PO TABS: 15.0000 mg | ORAL_TABLET | Freq: Every day | ORAL | Status: DC

## 2014-03-29 NOTE — Patient Instructions (Signed)
Rest and ICE the area.

## 2014-03-29 NOTE — Progress Notes (Signed)
Subjective:    Patient ID: Erin Warren, female    DOB: 09-25-1975, 38 y.o.   MRN: [id number]   PCP: Gagan Dillion, PA-C  Chief Complaint  Patient presents with  . Animal Bite    cat bit pt on right hand, happened last night   . Leg Pain    painful to move, x 1week  . Medication Refill    Metformin 500 & Levothyroxine 25 MCG    Medications, allergies, past medical history, surgical history, family history, social history and problem list reviewed and updated.  Patient Active Problem List   Diagnosis Date Noted  . Bunion   . Migraine with aura 01/22/2014  . Hypothyroidism 09/27/2013  . 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 POLYCYSTIC OVARIAN DISEASE 12/23/2006    Prior to Admission medications   Medication Sig Start Date End Date Taking? Authorizing Provider  albuterol (PROVENTIL HFA;VENTOLIN HFA) 108 (90 BASE) MCG/ACT inhaler Inhale 2 puffs into the lungs every 6 (six) hours as needed. 09/15/12  Yes Dionne Bucy McClung, PA-C  fluticasone (FLONASE) 50 MCG/ACT nasal spray Place 2 sprays into the nose daily. 09/18/12  Yes Dionne Bucy McClung, PA-C  lactobacillus acidophilus (BACID) TABS tablet Take 1 tablet by mouth daily.   Yes Historical Provider, MD  levothyroxine (SYNTHROID, LEVOTHROID) 25 MCG tablet Take 1 tablet (25 mcg total) by mouth daily before breakfast. PATIENT NEEDS OFFICE VISIT FOR ADDITIONAL REFILLS 06/27/13  Yes Heather M Marte, PA-C  levothyroxine (SYNTHROID, LEVOTHROID) 25 MCG tablet Take 1 tablet (25 mcg total) by mouth daily. PATIENT NEEDS OFFICE VISIT FOR ADDITIONAL REFILLS   Yes Jayceion Lisenby S Anida Deol, PA-C  metFORMIN (GLUCOPHAGE) 500 MG tablet TAKE 1 TABLET TWICE A DAY 02/19/12  Yes Ryan M Dunn, PA-C  metFORMIN (GLUCOPHAGE) 500 MG tablet Take 1 tablet (500 mg total) by mouth 2 (two) times daily. PATIENT NEEDS OFFICE VISIT FOR ADDITIONAL REFILLS   Yes Eathan Groman S Almeda Ezra, PA-C  OVER THE COUNTER MEDICATION daily. Claritin qd   Yes  Historical Provider, MD  propranolol ER (INDERAL LA) 60 MG 24 hr capsule Take 1 capsule (60 mg total) by mouth daily. 12/24/13  Yes Penni Bombard, MD  QVAR 80 MCG/ACT inhaler INHALE 1 PUFF INTO THE LUNGS AS NEEDED. 09/27/13  Yes Taeja Debellis S Yafet Cline, PA-C  Vitamin D, Ergocalciferol, (DRISDOL) 50000 UNITS CAPS capsule Take 50,000 Units by mouth every 7 (seven) days.   Yes Historical Provider, MD    HPI  1. Bit by her own cat last night.  Vaccines are current, but has multiple dental abscesses, so the vet advised she gets checked. Washed with soap and water, alcohol, and H2O2, then applied neosporin. Painful, and somewhat swollen. Tdap was 05/2009.  2. For her birthday, visited [place], where she did a lot of walking around downtown.  Toward the end of the trip, developed shin pain on the LEFT.  No specific trauma/injury recalled, though she noticed a small bruise on the LEFT shin where the pain is. Pain occurs even with walking short distances.  Weight bearing at a stand still is painful if she's holding something, like a grocery bag or their 8 lb cat.  Plantar flexion also increases the pain.  Naproxen (2 250 mg tabs) without relief. Overall pain has improved with rest, but is still present.  3. Needs refills of Metformin and levothyroxine. Last labs in 09/2013.    Review of Systems As above.    Objective:   Physical Exam  Constitutional:  She is oriented to person, place, and time. She appears well-developed and well-nourished. She is active. No distress.  BP 124/84  Pulse 82  Temp(Src) 98.6 F (37 C) (Oral)  Resp 16  Ht 5' 5.25" (1.657 m)  Wt 194 lb 12.8 oz (88.361 kg)  BMI 32.18 kg/m2  SpO2 99%  LMP 03/07/2014   HENT:  Head: Normocephalic and atraumatic.  Mouth/Throat: No oropharyngeal exudate.  Eyes: Conjunctivae and EOM are normal. Pupils are equal, round, and reactive to light. No scleral icterus.  Neck: Normal range of motion. Neck supple. No thyromegaly present.    Cardiovascular: Normal rate, regular rhythm and normal heart sounds.   Pulmonary/Chest: Effort normal and breath sounds normal.  Musculoskeletal:       Left wrist: Normal.       Right knee: Normal.       Left knee: Normal.       Right ankle: Normal. Achilles tendon normal.       Left ankle: Normal. Achilles tendon normal.       Left hand: She exhibits tenderness and swelling. She exhibits normal range of motion and no bony tenderness. Normal sensation noted. Normal strength noted.       Hands:      Right lower leg: Normal.       Left lower leg: She exhibits tenderness. She exhibits no bony tenderness, no swelling, no edema, no deformity and no laceration.       Legs: Mild erythema and edema of the LEFT hand between the thumb and index finger.  Minimal increased warmth.  Bite marks are not clearly identified.  Lymphadenopathy:    She has no cervical adenopathy.  Neurological: She is alert and oriented to person, place, and time.  Skin: Skin is warm and dry. There is erythema.  Psychiatric: She has a normal mood and affect. Her behavior is normal. Thought content normal.   LEFT TIB/FIB: [facility] reading (PRIMARY) by  Dr. Everlene Farrier. No bony deformity.      Assessment & Plan:  1. POLYCYSTIC OVARIAN DISEASE Stable. Continue current treatment. - Comprehensive metabolic panel - metFORMIN (GLUCOPHAGE) 500 MG tablet; Take 1 tablet (500 mg total) by mouth 2 (two) times daily.  Dispense: 180 tablet; Refill: 3  2. Hypothyroidism, unspecified hypothyroidism type Await lab. Continue current treatment. Recheck in 6 months. - TSH - levothyroxine (SYNTHROID, LEVOTHROID) 25 MCG tablet; Take 1 tablet (25 mcg total) by mouth daily.  Dispense: 90 tablet; Refill: 3  3. Pain in joint, lower leg, left Anticipatory guidance.  Likely a minor injury exacerbated by increased walking and baseline plantar fasciitis. Rest, Ice, NSAIDS. - DG Tibia/Fibula Left; Future - meloxicam (MOBIC) 15 MG tablet; Take 1 tablet  (15 mg total) by mouth daily.  Dispense: 30 tablet; Refill: 1  4. Open wound of hand with complication, left, initial encounter 5. Cellulitis of left upper extremity Anticipatory guidance. RTC if symptoms not dramatically improved in 48 hours, sooner if worsens. - amoxicillin-clavulanate (AUGMENTIN) 875-125 MG per tablet; Take 1 tablet by mouth 2 (two) times daily.  Dispense: 20 tablet; Refill: 0   Fara Chute, PA-C Physician Assistant-Certified [facility]

## 2014-03-30 LAB — COMPREHENSIVE METABOLIC PANEL
ALT: 18 U/L (ref 0–35)
AST: 17 U/L (ref 0–37)
Albumin: 4.1 g/dL (ref 3.5–5.2)
Alkaline Phosphatase: 64 U/L (ref 39–117)
BILIRUBIN TOTAL: 0.5 mg/dL (ref 0.2–1.2)
BUN: 11 mg/dL (ref 6–23)
CO2: 24 meq/L (ref 19–32)
CREATININE: 0.75 mg/dL (ref 0.50–1.10)
Calcium: 9.6 mg/dL (ref 8.4–10.5)
Chloride: 102 mEq/L (ref 96–112)
GLUCOSE: 88 mg/dL (ref 70–99)
Potassium: 4.1 mEq/L (ref 3.5–5.3)
Sodium: 137 mEq/L (ref 135–145)
Total Protein: 7.6 g/dL (ref 6.0–8.3)

## 2014-03-30 LAB — TSH: TSH: 1.963 u[IU]/mL (ref 0.350–4.500)

## 2014-04-04 ENCOUNTER — Ambulatory Visit: Payer: Self-pay | Admitting: Physician Assistant

## 2014-04-09 ENCOUNTER — Ambulatory Visit: Payer: Managed Care, Other (non HMO) | Admitting: Physician Assistant

## 2014-06-21 ENCOUNTER — Other Ambulatory Visit: Payer: Self-pay | Admitting: Diagnostic Neuroimaging

## 2014-07-25 ENCOUNTER — Ambulatory Visit: Payer: Managed Care, Other (non HMO) | Admitting: Nurse Practitioner

## 2014-08-06 HISTORY — PX: PLANTAR FASCIECTOMY: SUR600

## 2014-09-01 ENCOUNTER — Other Ambulatory Visit: Payer: Self-pay | Admitting: Diagnostic Neuroimaging

## 2014-09-03 NOTE — Telephone Encounter (Signed)
No showed last appt.  Called patient, got no answer.  Left message.

## 2014-09-04 NOTE — Telephone Encounter (Signed)
I called again.  Got no answer.   

## 2014-09-05 NOTE — Telephone Encounter (Signed)
Sending small supply so patient is not without medication over holiday weekend

## 2014-10-07 ENCOUNTER — Other Ambulatory Visit: Payer: Self-pay | Admitting: Physician Assistant

## 2014-11-12 ENCOUNTER — Ambulatory Visit (INDEPENDENT_AMBULATORY_CARE_PROVIDER_SITE_OTHER): Payer: Managed Care, Other (non HMO) | Admitting: Physician Assistant

## 2014-11-12 VITALS — BP 124/92 | HR 98 | Temp 98.3°F | Resp 16 | Ht 65.5 in | Wt 210.1 lb

## 2014-11-12 DIAGNOSIS — J029 Acute pharyngitis, unspecified: Secondary | ICD-10-CM

## 2014-11-12 LAB — POCT RAPID STREP A (OFFICE): Rapid Strep A Screen: NEGATIVE

## 2014-11-12 MED ORDER — FIRST-MOUTHWASH BLM MT SUSP: OROMUCOSAL | Status: DC

## 2014-11-12 NOTE — Progress Notes (Signed)
   Subjective:    Patient ID: Erin Warren, female    DOB: April 17, 1976, 39 y.o.   MRN: [id number]  HPI Pt presents to clinic with 1 week h/o distal tongue discomfort and then today she developed a sore throat and feels like something is in her throat.  She has some seasonal allergy history but she is not experiencing many allergy symptoms and she is on her medication daily.  She does have some nasal congestion but is not aware of PND.  She does not remember hurting her tongue in any way.  She Qvar but has only missed her mouth rinse once.   Review of Systems  Constitutional: Negative for fever and chills.  HENT: Positive for postnasal drip, rhinorrhea and sore throat. Negative for trouble swallowing and voice change.   Allergic/Immunologic: Positive for environmental allergies.       Objective:   Physical Exam  Constitutional: She is oriented to person, place, and time. She appears well-developed and well-nourished.  BP 124/92 mmHg  Pulse 98  Temp(Src) 98.3 F (36.8 C) (Oral)  Resp 16  Ht 5' 5.5" (1.664 m)  Wt 210 lb 2 oz (95.312 kg)  BMI 34.42 kg/m2  SpO2 98%  LMP 10/24/2014   HENT:  Head: Normocephalic and atraumatic.  Right Ear: Hearing, tympanic membrane, external ear and ear canal normal.  Left Ear: Hearing, tympanic membrane, external ear and ear canal normal.  Nose: Nose normal. No mucosal edema.  Mouth/Throat: Uvula swelling (mild erythema) present. No oropharyngeal exudate, posterior oropharyngeal edema or posterior oropharyngeal erythema.    Eyes: Conjunctivae are normal.  Neck: Normal range of motion. Neck supple.  Cardiovascular: Normal rate, regular rhythm and normal heart sounds.   No murmur heard. Pulmonary/Chest: Effort normal and breath sounds normal. She has no wheezes.  Neurological: She is alert and oriented to person, place, and time.  Skin: Skin is warm and dry.  Psychiatric: She has a normal mood and affect. Her behavior is normal. Judgment and  thought content normal.   Results for orders placed or performed in visit on 11/12/14  POCT rapid strep A  Result Value Ref Range   Rapid Strep A Screen Negative Negative      Assessment & Plan:  Sore throat - Plan: POCT rapid strep A, DPH-Lido-AlHydr-MgHydr-Simeth (FIRST-MOUTHWASH BLM) SUSP  Symptomatic treatment.  Continue to monitor the tongue for any change.  I do not think thrush is likely due to only 1 missed mouth rinse after Qvar.  Windell Hummingbird PA-C  [facility] 11/14/2014 7:58 PM

## 2014-11-24 ENCOUNTER — Other Ambulatory Visit: Payer: Self-pay | Admitting: Physician Assistant

## 2014-11-25 ENCOUNTER — Other Ambulatory Visit: Payer: Self-pay | Admitting: Physician Assistant

## 2014-12-02 MED ORDER — BECLOMETHASONE DIPROPIONATE 80 MCG/ACT IN AERS: 1.0000 | INHALATION_SPRAY | Freq: Every day | RESPIRATORY_TRACT | Status: DC | PRN

## 2014-12-02 NOTE — Telephone Encounter (Signed)
Received fax from pharm that ins will only cover 90 day supplies. Pt has appt sch for 02/04/15. Do you want to approve 90 day supply?

## 2014-12-02 NOTE — Addendum Note (Signed)
Addended by: Elwyn Reach A on: 12/02/2014 09:52 AM   Modules accepted: Orders

## 2014-12-08 ENCOUNTER — Other Ambulatory Visit: Payer: Self-pay | Admitting: Diagnostic Neuroimaging

## 2014-12-09 NOTE — Telephone Encounter (Signed)
Patient has appt

## 2015-01-13 ENCOUNTER — Other Ambulatory Visit: Payer: Self-pay | Admitting: Physician Assistant

## 2015-02-04 ENCOUNTER — Ambulatory Visit (INDEPENDENT_AMBULATORY_CARE_PROVIDER_SITE_OTHER): Payer: Managed Care, Other (non HMO) | Admitting: Physician Assistant

## 2015-02-04 ENCOUNTER — Encounter: Payer: Self-pay | Admitting: Physician Assistant

## 2015-02-04 VITALS — BP 110/77 | HR 76 | Temp 98.7°F | Resp 16 | Ht 66.0 in | Wt 212.2 lb

## 2015-02-04 DIAGNOSIS — Z6834 Body mass index (BMI) 34.0-34.9, adult: Secondary | ICD-10-CM | POA: Diagnosis not present

## 2015-02-04 DIAGNOSIS — Z91018 Allergy to other foods: Secondary | ICD-10-CM | POA: Diagnosis not present

## 2015-02-04 DIAGNOSIS — E039 Hypothyroidism, unspecified: Secondary | ICD-10-CM

## 2015-02-04 DIAGNOSIS — Z130 Encounter for screening for diseases of the blood and blood-forming organs and certain disorders involving the immune mechanism: Secondary | ICD-10-CM | POA: Diagnosis not present

## 2015-02-04 DIAGNOSIS — G43809 Other migraine, not intractable, without status migrainosus: Secondary | ICD-10-CM | POA: Diagnosis not present

## 2015-02-04 DIAGNOSIS — Z131 Encounter for screening for diabetes mellitus: Secondary | ICD-10-CM

## 2015-02-04 DIAGNOSIS — Z0000 Encounter for general adult medical examination without abnormal findings: Secondary | ICD-10-CM

## 2015-02-04 DIAGNOSIS — Z114 Encounter for screening for human immunodeficiency virus [HIV]: Secondary | ICD-10-CM | POA: Diagnosis not present

## 2015-02-04 DIAGNOSIS — J302 Other seasonal allergic rhinitis: Secondary | ICD-10-CM | POA: Insufficient documentation

## 2015-02-04 DIAGNOSIS — E282 Polycystic ovarian syndrome: Secondary | ICD-10-CM

## 2015-02-04 DIAGNOSIS — Z13220 Encounter for screening for lipoid disorders: Secondary | ICD-10-CM

## 2015-02-04 DIAGNOSIS — E559 Vitamin D deficiency, unspecified: Secondary | ICD-10-CM | POA: Diagnosis not present

## 2015-02-04 LAB — CBC WITH DIFFERENTIAL/PLATELET
BASOS ABS: 0 10*3/uL (ref 0.0–0.1)
BASOS PCT: 0 % (ref 0–1)
Eosinophils Absolute: 0.3 10*3/uL (ref 0.0–0.7)
Eosinophils Relative: 3 % (ref 0–5)
HEMATOCRIT: 40.2 % (ref 36.0–46.0)
Hemoglobin: 13.9 g/dL (ref 12.0–15.0)
LYMPHS PCT: 24 % (ref 12–46)
Lymphs Abs: 2.2 10*3/uL (ref 0.7–4.0)
MCH: 31.3 pg (ref 26.0–34.0)
MCHC: 34.6 g/dL (ref 30.0–36.0)
MCV: 90.5 fL (ref 78.0–100.0)
MPV: 9.8 fL (ref 8.6–12.4)
Monocytes Absolute: 0.5 10*3/uL (ref 0.1–1.0)
Monocytes Relative: 6 % (ref 3–12)
NEUTROS ABS: 6 10*3/uL (ref 1.7–7.7)
Neutrophils Relative %: 67 % (ref 43–77)
PLATELETS: 400 10*3/uL (ref 150–400)
RBC: 4.44 MIL/uL (ref 3.87–5.11)
RDW: 13.2 % (ref 11.5–15.5)
WBC: 9 10*3/uL (ref 4.0–10.5)

## 2015-02-04 LAB — COMPREHENSIVE METABOLIC PANEL
ALK PHOS: 75 U/L (ref 39–117)
ALT: 12 U/L (ref 0–35)
AST: 14 U/L (ref 0–37)
Albumin: 3.8 g/dL (ref 3.5–5.2)
BUN: 10 mg/dL (ref 6–23)
CO2: 23 mEq/L (ref 19–32)
Calcium: 9.2 mg/dL (ref 8.4–10.5)
Chloride: 107 mEq/L (ref 96–112)
Creat: 0.7 mg/dL (ref 0.50–1.10)
GLUCOSE: 92 mg/dL (ref 70–99)
Potassium: 4.2 mEq/L (ref 3.5–5.3)
Sodium: 140 mEq/L (ref 135–145)
Total Bilirubin: 0.3 mg/dL (ref 0.2–1.2)
Total Protein: 6.8 g/dL (ref 6.0–8.3)

## 2015-02-04 LAB — LIPID PANEL
CHOL/HDL RATIO: 2.7 ratio
Cholesterol: 151 mg/dL (ref 0–200)
HDL: 55 mg/dL (ref >=46)
LDL CALC: 73 mg/dL (ref 0–99)
Triglycerides: 114 mg/dL (ref <150)
VLDL: 23 mg/dL (ref 0–40)

## 2015-02-04 LAB — TSH: TSH: 2.759 u[IU]/mL (ref 0.350–4.500)

## 2015-02-04 LAB — HIV ANTIBODY (ROUTINE TESTING W REFLEX): HIV: NONREACTIVE

## 2015-02-04 MED ORDER — FLUTICASONE PROPIONATE 50 MCG/ACT NA SUSP: NASAL | Status: DC

## 2015-02-04 MED ORDER — LEVOTHYROXINE SODIUM 25 MCG PO TABS: 25.0000 ug | ORAL_TABLET | Freq: Every day | ORAL | Status: DC

## 2015-02-04 MED ORDER — ALBUTEROL SULFATE HFA 108 (90 BASE) MCG/ACT IN AERS: 2.0000 | INHALATION_SPRAY | Freq: Four times a day (QID) | RESPIRATORY_TRACT | Status: AC | PRN

## 2015-02-04 MED ORDER — BECLOMETHASONE DIPROPIONATE 80 MCG/ACT IN AERS: 1.0000 | INHALATION_SPRAY | Freq: Every day | RESPIRATORY_TRACT | Status: DC | PRN

## 2015-02-04 MED ORDER — PROPRANOLOL HCL ER 60 MG PO CP24: 60.0000 mg | ORAL_CAPSULE | Freq: Every day | ORAL | Status: DC

## 2015-02-04 MED ORDER — METFORMIN HCL 500 MG PO TABS: 500.0000 mg | ORAL_TABLET | Freq: Two times a day (BID) | ORAL | Status: DC

## 2015-02-04 NOTE — Patient Instructions (Signed)
I will contact you with your lab results as soon as they are available.   If you have not heard from me in 2 weeks, please contact me.  The fastest way to get your results is to register for My Chart (see the instructions on the last page of this printout).  Keeping You Healthy  Get These Tests 1. Blood Pressure- Have your blood pressure checked once a year by your health care provider.  Normal blood pressure is 120/80. 2. Weight- Have your body mass index (BMI) calculated to screen for obesity.  BMI is measure of body fat based on height and weight.  You can also calculate your own BMI at GravelBags.it. 3. Cholesterol- Have your cholesterol checked every 5 years starting at age 19 then yearly starting at age 57. 48. Chlamydia, HIV, and other sexually transmitted diseases- Get screened every year until age 22, then within three months of each new sexual provider. 5. Pap Test - Every 1-5 years; discuss with your health care provider. 6. Mammogram- Every 1-2 years starting at age 77--50  Take these medicines  Calcium with Vitamin D-Your body needs 1200 mg of Calcium each day and [phone] IU of Vitamin D daily.  Your body can only absorb 500 mg of Calcium at a time so Calcium must be taken in 2 or 3 divided doses throughout the day.  Multivitamin with folic acid- Once daily if it is possible for you to become pregnant.  Get these Immunizations  Gardasil-Series of three doses; prevents HPV related illness such as genital warts and cervical cancer.  Menactra-Single dose; prevents meningitis.  Tetanus shot- Every 10 years.  Flu shot-Every year.  Take these steps 1. Do not smoke-Your healthcare provider can help you quit.  For tips on how to quit go to www.smokefree.gov or call 1-800 QUITNOW. 2. Be physically active- Exercise 5 days a week for at least 30 minutes.  If you are not already physically active, start slow and gradually work up to 30 minutes of moderate physical  activity.  Examples of moderate activity include walking briskly, dancing, swimming, bicycling, etc. 3. Breast Cancer- A self breast exam every month is important for early detection of breast cancer.  For more information and instruction on self breast exams, ask your healthcare provider or https://www.patel.info/. 4. Eat a healthy diet- Eat a variety of healthy foods such as fruits, vegetables, whole grains, low fat milk, low fat cheeses, yogurt, lean meats, poultry and fish, beans, nuts, tofu, etc.  For more information go to www. Thenutritionsource.org 5. Drink alcohol in moderation- Limit alcohol intake to one drink or less per day. Never drink and drive. 6. Depression- Your emotional health is as important as your physical health.  If you're feeling down or losing interest in things you normally enjoy please talk to your healthcare provider about being screened for depression. 7. Dental visit- Brush and floss your teeth twice daily; visit your dentist twice a year. 8. Eye doctor- Get an eye exam at least every 2 years. 9. Helmet use- Always wear a helmet when riding a bicycle, motorcycle, rollerblading or skateboarding. 9. Safe sex- If you may be exposed to sexually transmitted infections, use a condom. 11. Seat belts- Seat belts can save your live; always wear one. 12. Smoke/Carbon Monoxide detectors- These detectors need to be installed on the appropriate level of your home. Replace batteries at least once a year. 13. Skin cancer- When out in the sun please cover up and use sunscreen 15 SPF  or higher. 14. Violence- If anyone is threatening or hurting you, please tell your healthcare provider.

## 2015-02-04 NOTE — Progress Notes (Signed)
Patient ID: Erin Warren, female    DOB: Sep 05, 1976, 39 y.o.   MRN: [id number]  PCP: Wynne Dust  Subjective:   Chief Complaint  Patient presents with  . Annual Exam    W/O PAP  - Pt is fasting    HPI  Presents for annual wellness exam. Generally is doing well. Has been having headaches, diagnosed as migraine, and will follow-up with Dr. Leta Baptist this week.  She has never had a mammogram. We reviewed the recommendations, and discussed options.  Last pap was 09/27/2013, normal cytology and negative HPV. Repeat both in 2018.  Review of Systems Review of Systems  Constitutional: Negative.   HENT: Negative for congestion, dental problem, drooling, ear discharge, ear pain, facial swelling, hearing loss, mouth sores, nosebleeds, postnasal drip, rhinorrhea, sinus pressure, sneezing, sore throat, tinnitus, trouble swallowing and voice change.   Eyes: Negative.   Respiratory: Negative.   Cardiovascular: Positive for leg swelling. Negative for chest pain and palpitations.  Gastrointestinal: Negative.   Endocrine: Negative.   Genitourinary: Negative.   Musculoskeletal: Positive for myalgias (soreness in the LEFT posterior thigh with elevation recommended for LE edema). Negative for back pain, joint swelling, arthralgias, gait problem, neck pain and neck stiffness.  Skin: Negative.        Multiple tattoos  Allergic/Immunologic: Positive for environmental allergies (Flonase and OTC oral antihistamine work) and food allergies (interested in seeing a specialist about this issue.). Negative for immunocompromised state.  Neurological: Positive for headaches (diagnosed with migraine 2015. Follow-up with neurologist this week.). Negative for dizziness, tremors, seizures, syncope, facial asymmetry, speech difficulty, weakness, light-headedness and numbness.  Hematological: Negative.   Psychiatric/Behavioral: Negative.        Patient Active Problem List   Diagnosis Date Noted  .  Vitamin D deficiency 02/04/2015  . Seasonal allergies 02/04/2015  . Multiple food allergies 02/04/2015  . Bunion   . Migraine with aura 01/22/2014  . Hypothyroidism 09/27/2013  . 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 POLYCYSTIC OVARIAN DISEASE 12/23/2006    Prior to Admission medications   Medication Sig Start Date End Date Taking? Authorizing Provider  albuterol (PROVENTIL HFA;VENTOLIN HFA) 108 (90 BASE) MCG/ACT inhaler Inhale 2 puffs into the lungs every 6 (six) hours as needed. 09/15/12  Yes Dionne Bucy McClung, PA-C  beclomethasone (QVAR) 80 MCG/ACT inhaler Inhale 1 puff into the lungs daily as needed. PATIENT NEEDS CHECK UP FOR ADDITIONAL REFILLS 12/02/14  Yes Shenee Wignall, PA-C  fluticasone (FLONASE) 50 MCG/ACT nasal spray PLACE 2 SPRAYS INTO BOTH NOSTRILS DAILY. 01/14/15  Yes Mancel Bale, PA-C  levothyroxine (SYNTHROID, LEVOTHROID) 25 MCG tablet Take 1 tablet (25 mcg total) by mouth daily. 03/29/14  Yes Ellyse Rotolo, PA-C  loratadine (CLARITIN) 10 MG tablet Take 10 mg by mouth daily as needed for allergies.    Yes Historical Provider, MD  metFORMIN (GLUCOPHAGE) 500 MG tablet Take 1 tablet (500 mg total) by mouth 2 (two) times daily. 03/29/14  Yes Domonique Cothran, PA-C  propranolol ER (INDERAL LA) 60 MG 24 hr capsule TAKE ONE CAPSULE BY MOUTH ONCE DAILY 09/05/14  Yes Penni Bombard, MD  Vitamin D, Ergocalciferol, (DRISDOL) 2000 UNITS CAPS capsule Take 2,000 Units by mouth every day.   Yes Historical Provider, MD    Allergies  Allergen Reactions  . Topamax [Topiramate] Other (See Comments)    Flushing, paresthesias (tingling, burning).      Past Medical History  Diagnosis Date  . Allergy   . Thyroid  disease   . Hypertension   . Asthma   . Polycystic ovarian syndrome   . Heel spur     Both heels, one has fractured off  . Bunion      Past Surgical History  Procedure Laterality Date  . Teratoma excision    . Tubal ligation    .  Plantar fasciectomy      Family History  Problem Relation Age of Onset  . Thyroid disease Mother   . Allergies Mother   . Hypertension Father   . Heart disease Father   . Alcohol abuse Maternal Grandfather   . Hypertension Paternal Grandmother   . Hypertension Paternal Grandfather   . Diabetes Paternal Grandfather   . Hypertension Brother     History   Social History  . Marital Status: Significant Other    Spouse Name: Youlanda [facility]  . Number of Children: 0  . Years of Education: College gr   Occupational History  . 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 Smoking status: Never Smoker   . Smokeless tobacco: Never Used  . Alcohol Use: 0.6 oz/week    1 Glasses of wine per week     Comment: 1-4/month  . Drug Use: No  . Sexual Activity:    Partners: Male    Birth Control/ Protection: Surgical   Other Topics Concern  . None   Social History Narrative   Patient lives at home with domestic partner.   Caffeine Use: 3 cups daily         Objective:  Physical Exam  Physical Exam  Constitutional: She is oriented to person, place, and time. Vital signs are normal. She appears well-developed and well-nourished. She is active and cooperative. No distress.  HENT:  Head: Normocephalic and atraumatic.  Right Ear: Hearing, tympanic membrane, external ear and ear canal normal. No foreign bodies.  Left Ear: Hearing, tympanic membrane, external ear and ear canal normal. No foreign bodies.  Nose: Nose normal.  Mouth/Throat: Uvula is midline, oropharynx is clear and moist and mucous membranes are normal. No oral lesions. Normal dentition. No dental abscesses or uvula swelling. No oropharyngeal exudate.  Eyes: Conjunctivae, EOM and lids are normal. Pupils are equal, round, and reactive to light. Right eye exhibits no discharge. Left eye exhibits no discharge. No scleral icterus.  Fundoscopic exam:      The right eye shows no  arteriolar narrowing, no AV nicking, no exudate, no hemorrhage and no papilledema. The right eye shows red reflex.       The left eye shows no arteriolar narrowing, no AV nicking, no exudate, no hemorrhage and no papilledema. The left eye shows red reflex.  Neck: Trachea normal, normal range of motion and full passive range of motion without pain. Neck supple. No spinous process tenderness and no muscular tenderness present. No thyroid mass and no thyromegaly present.  Cardiovascular: Normal rate, regular rhythm, normal heart sounds, intact distal pulses and normal pulses.   Pulmonary/Chest: Effort normal and breath sounds normal. Right breast exhibits no inverted nipple, no mass, no nipple discharge, no skin change and no tenderness. Left breast exhibits no inverted nipple, no mass, no nipple discharge, no skin change and no tenderness. Breasts are symmetrical.  Musculoskeletal: She exhibits no edema or tenderness.       Cervical back: Normal.       Thoracic back: Normal.       Lumbar back: Normal.  Lymphadenopathy:       Head (right side): No tonsillar, no preauricular, no posterior auricular and no occipital adenopathy present.       Head (left side): No tonsillar, no preauricular, no posterior auricular and no occipital adenopathy present.    She has no cervical adenopathy.       Right: No supraclavicular adenopathy present.       Left: No supraclavicular adenopathy present.  Neurological: She is alert and oriented to person, place, and time. She has normal strength and normal reflexes. No cranial nerve deficit. She exhibits normal muscle tone. Coordination and gait normal.  Skin: Skin is warm, dry and intact. No rash noted. She is not diaphoretic. No cyanosis or erythema. Nails show no clubbing.  Psychiatric: She has a normal mood and affect. Her speech is normal and behavior is normal. Judgment and thought content normal.  Vitals reviewed.          Assessment & Plan:  1. Annual  physical exam Age appropriate anticipatory guidance provided.  2. Hypothyroidism, unspecified hypothyroidism type Await lab results. She has been stable x >12 months. Adjust dose if needed. - TSH  3. Screening for HIV (human immunodeficiency virus) - HIV antibody  4. BMI 34.0-34.9,adult Encouraged healthy eating and regular exercise. This will likely improve the swelling in her legs as well.  5. Screening for hyperlipidemia Await lab results. Healthy lifestyle changes recommended. - Lipid panel  6. Screening for deficiency anemia - CBC with Differential/Platelet  7. Screening for diabetes mellitus - Comprehensive metabolic panel  8. Vitamin D deficiency Await updated Vitamin D level. Continue current OTC dose for now.  9. Seasonal and Food allergies She requests referral to allergist to discuss the food allergy component, treatment options and if she needs an Epi-Pen. While I'm happy to prescribe the epi-pen, she wants specialist advice.  10. Polycystic Ovarian Syndrome Continue treatment with metformin. Encouraged healthy lifestyle modification, weight loss.  11. Migraine Headache Follow-up with Dr. Leta Baptist this week as planned.  Fara Chute, PA-C Physician Assistant-Certified [facility]

## 2015-02-05 LAB — VITAMIN D 25 HYDROXY (VIT D DEFICIENCY, FRACTURES): VIT D 25 HYDROXY: 40 ng/mL (ref 30–100)

## 2015-02-06 ENCOUNTER — Ambulatory Visit (INDEPENDENT_AMBULATORY_CARE_PROVIDER_SITE_OTHER): Payer: Managed Care, Other (non HMO) | Admitting: Diagnostic Neuroimaging

## 2015-02-06 ENCOUNTER — Encounter: Payer: Self-pay | Admitting: Diagnostic Neuroimaging

## 2015-02-06 VITALS — BP 143/97 | HR 79 | Ht 66.0 in | Wt 213.7 lb

## 2015-02-06 DIAGNOSIS — G43809 Other migraine, not intractable, without status migrainosus: Secondary | ICD-10-CM

## 2015-02-06 MED ORDER — PROPRANOLOL HCL ER 60 MG PO CP24: 60.0000 mg | ORAL_CAPSULE | Freq: Every day | ORAL | Status: DC

## 2015-02-06 NOTE — Progress Notes (Signed)
[facility]  PATIENT: Erin Warren DOB: 1975/11/16  REFERRING CLINICIAN:  HISTORY FROM: patient  REASON FOR VISIT: follow up   HISTORICAL  CHIEF COMPLAINT:  Chief Complaint  Patient presents with  . Follow-up    migraine    HISTORY OF PRESENT ILLNESS:   UPDATE 02/06/15 (VRP): Doing well. Avg 1-4 days /month of HA; worse with overcast weather. Tolerating propranolol. Using OTC aleve and tylenol for breakthrough HA.   UPDATE 01/22/14 (LL): She could not tolerate SE of Topamax and was changed to Propranolol with benefit. Headaches are reduced to 2-3 per week instead of almost every day. Headaches have not been as incapacitating; she is able to function better now. 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 This is likely an incidental finding.   PRIOR HPI (10/17/13, VRP): 39 year old right-handed female with history polycystic ovarian disease, teratoma, asthma, hypertension, here for evaluation of headaches. Patient has had intermittent headaches since age 59 years old. Typically they are pressure, frontal and bitemporal headaches, one per month lasting one hour at a time. Sometimes she has nausea, fatigue, sees sparkles of light with the bad headaches. Vision tends to be photosensitive generally speaking. No phonophobia. Sometimes she has shoulder and neck pain, sometimes sinus congestion with these headaches. Over past 6-7 months patient has had change in her headaches. Now they are more severe and more frequent. Now she's having headaches every 2-3 days, lasting hours or one day at a time. She's been using naproxen 2-3 times per week to help with headaches. Triggering factors include menstrual cycle, change in weather, stress. Family history positive for migraine in patient's maternal aunt. Patient's mother has similar headaches as well but not officially diagnosed with migraine.    REVIEW OF  SYSTEMS: Full 14 system review of systems performed and notable only for fatigue leg swelling (due to plantar fasciitis) headaches.   ALLERGIES: Allergies  Allergen Reactions  . Topamax [Topiramate] Other (See Comments)    Flushing, paresthesias (tingling, burning).      HOME MEDICATIONS: Outpatient Prescriptions Prior to Visit  Medication Sig Dispense Refill  . albuterol (PROVENTIL HFA;VENTOLIN HFA) 108 (90 BASE) MCG/ACT inhaler Inhale 2 puffs into the lungs every 6 (six) hours as needed. 1 Inhaler 3  . beclomethasone (QVAR) 80 MCG/ACT inhaler Inhale 1 puff into the lungs daily as needed. 3 Inhaler 3  . Cholecalciferol (VITAMIN D) 2000 UNITS tablet Take 2,000 Units by mouth daily.    . fluticasone (FLONASE) 50 MCG/ACT nasal spray PLACE 2 SPRAYS INTO BOTH NOSTRILS DAILY. 48 g 3  . levothyroxine (SYNTHROID, LEVOTHROID) 25 MCG tablet Take 1 tablet (25 mcg total) by mouth daily. 90 tablet 3  . loratadine (CLARITIN) 10 MG tablet Take 10 mg by mouth daily as needed for allergies.     . metFORMIN (GLUCOPHAGE) 500 MG tablet Take 1 tablet (500 mg total) by mouth 2 (two) times daily. 180 tablet 3  . propranolol ER (INDERAL LA) 60 MG 24 hr capsule Take 1 capsule (60 mg total) by mouth daily. 90 capsule 3   No facility-administered medications prior to visit.    PAST MEDICAL HISTORY: Past Medical History  Diagnosis Date  . Allergy   . Thyroid disease   . Hypertension   . Asthma   . Polycystic ovarian syndrome   . Heel spur     Both heels, one has fractured off  . Bunion     PAST SURGICAL  HISTORY: Past Surgical History  Procedure Laterality Date  . Teratoma excision Left   . Plantar fasciectomy Bilateral 08/2014  . Tubal ligation  2008    FAMILY HISTORY: Family History  Problem Relation Age of Onset  . Thyroid disease Mother   . Allergies Mother   . Hypertension Father   . Heart disease Father   . Alcohol abuse Maternal Grandfather   . Hypertension Paternal Grandmother   .  Hypertension Paternal Grandfather   . Diabetes Paternal Grandfather   . Hypertension Brother     SOCIAL HISTORY:  History   Social History  . Marital Status: Significant Other    Spouse Name: Youlanda [facility]  . Number of Children: 0  . Years of Education: College gr   Occupational History  . 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 Smoking status: Never Smoker   . Smokeless tobacco: Never Used  . Alcohol Use: 0.6 oz/week    1 Glasses of wine per week     Comment: 1-4/month  . Drug Use: No  . Sexual Activity:    Partners: Male    Birth Control/ Protection: Surgical   Other Topics Concern  . Not on file   Social History Narrative   Patient lives at home with domestic partner.   Caffeine Use: 1-2 cups of coffee a day     PHYSICAL EXAM  Filed Vitals:   02/06/15 1248  BP: 143/97  Pulse: 79  Height: 5\' 6"  (1.676 m)  Weight: 213 lb 11.2 oz (96.934 kg)   Body mass index is 34.51 kg/(m^2).  Wt Readings from Last 3 Encounters:  02/06/15 213 lb 11.2 oz (96.934 kg)  02/04/15 212 lb 3.2 oz (96.253 kg)  11/12/14 210 lb 2 oz (95.312 kg)   No exam data present  No flowsheet data found.  GENERAL EXAM: Patient is in no distress; well developed, nourished and groomed; neck is supple  CARDIOVASCULAR: Regular rate and rhythm, no murmurs, no carotid bruits  NEUROLOGIC: MENTAL STATUS: awake, alert, language fluent, comprehension intact, naming intact, fund of knowledge appropriate CRANIAL NERVE: no papilledema on fundoscopic exam, pupils equal and reactive to light, visual fields full to confrontation, extraocular muscles intact, no nystagmus, facial sensation and strength symmetric, hearing intact, palate elevates symmetrically, uvula midline, shoulder shrug symmetric, tongue midline. MOTOR: normal bulk and tone, full strength in the BUE, BLE SENSORY: normal and symmetric to light touch, temperature, vibration    COORDINATION: finger-nose-finger, fine finger movements normal REFLEXES: deep tendon reflexes present and symmetric GAIT/STATION: narrow based gait; able to walk tandem; romberg is negative    DIAGNOSTIC DATA (LABS, IMAGING, TESTING) - I reviewed patient records, labs, notes, testing and imaging myself where available.  Lab Results  Component Value Date   WBC 9.0 02/04/2015   HGB 13.9 02/04/2015   HCT 40.2 02/04/2015   MCV 90.5 02/04/2015   PLT 400 02/04/2015      Component Value Date/Time   NA 140 02/04/2015 1109   K 4.2 02/04/2015 1109   CL 107 02/04/2015 1109   CO2 23 02/04/2015 1109   GLUCOSE 92 02/04/2015 1109   BUN 10 02/04/2015 1109   CREATININE 0.70 02/04/2015 1109   CREATININE 0.76 01/04/2007 2027   CALCIUM 9.2 02/04/2015 1109   PROT 6.8 02/04/2015 1109   ALBUMIN 3.8 02/04/2015 1109   AST 14 02/04/2015 1109   ALT 12 02/04/2015 1109   ALKPHOS 75 02/04/2015 1109  BILITOT 0.3 02/04/2015 1109   Lab Results  Component Value Date   CHOL 151 02/04/2015   HDL 55 02/04/2015   LDLCALC 73 02/04/2015   TRIG 114 02/04/2015   CHOLHDL 2.7 02/04/2015   Lab Results  Component Value Date   HGBA1C 4.7 09/27/2013   No results found for: VITAMINB12 Lab Results  Component Value Date   TSH 2.759 02/04/2015    2/10/11/13 MRI brain (with and without) demonstrating: 1. There is T1 and T2 hyperintense lesion (measuring 1.2x1.3x1.0cm) in the right posterior sphenoid sinus region. This abuts the adjacent pituitary gland. On sagittal views, this may be contiguous with the normal pituitary tissue, but on axial views if appears distinct. No abnormal enhancement. Considerations include sphenoid sinus inflammatory disease or mucocele. Less likely represents an intrasellar/pituitary mass with intrasphenoidal extension. 2. Remainder of brain parenchyma is unremarkable.  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 Considerations  would include atypical hemangioma, or focal fibrous dysplasia. No evidence for expansile lesion encroaching on the surrounding neural or vascular structures. The lesion does not appear to represent a aggressive neoplasm such as chordoma.     ASSESSMENT AND PLAN  39 y.o. year old female here with history of headaches since age 42 years old, with worsening severity and frequency of headaches since 2015. Headaches have some migraine features (nausea, long duration, photosensitivity, seeing sparkling lights). Overall stable. Will refill meds.  PLAN: - continue propranolol + OTC aleve and tylenol  Meds ordered this encounter  Medications  . propranolol ER (INDERAL LA) 60 MG 24 hr capsule    Sig: Take 1 capsule (60 mg total) by mouth daily.    Dispense:  90 capsule    Refill:  4   Return in about 1 year (around 02/06/2016).    Penni Bombard, MD 0/12/5407, 8:11 PM Certified in Neurology, Neurophysiology and Neuroimaging  [facility] [address] [place], [place] [address] [phone]

## 2015-03-16 ENCOUNTER — Other Ambulatory Visit: Payer: Self-pay | Admitting: Physician Assistant

## 2015-03-18 ENCOUNTER — Encounter: Payer: Self-pay | Admitting: Physician Assistant

## 2015-03-18 DIAGNOSIS — K219 Gastro-esophageal reflux disease without esophagitis: Secondary | ICD-10-CM | POA: Insufficient documentation

## 2015-03-18 DIAGNOSIS — L503 Dermatographic urticaria: Secondary | ICD-10-CM | POA: Insufficient documentation

## 2015-04-15 ENCOUNTER — Other Ambulatory Visit: Payer: Self-pay | Admitting: Physician Assistant

## 2015-04-16 ENCOUNTER — Other Ambulatory Visit: Payer: Self-pay | Admitting: Physician Assistant

## 2015-06-01 ENCOUNTER — Ambulatory Visit (INDEPENDENT_AMBULATORY_CARE_PROVIDER_SITE_OTHER): Payer: Managed Care, Other (non HMO) | Admitting: Physician Assistant

## 2015-06-01 VITALS — BP 124/80 | HR 85 | Temp 99.5°F | Resp 17 | Ht 65.5 in | Wt 217.0 lb

## 2015-06-01 DIAGNOSIS — H109 Unspecified conjunctivitis: Secondary | ICD-10-CM | POA: Diagnosis not present

## 2015-06-01 MED ORDER — POLYMYXIN B-TRIMETHOPRIM 10000-0.1 UNIT/ML-% OP SOLN
1.0000 [drp] | Freq: Four times a day (QID) | OPHTHALMIC | Status: AC
Start: 2015-06-01 — End: 2015-06-06

## 2015-06-01 NOTE — Patient Instructions (Signed)

## 2015-06-06 NOTE — Progress Notes (Signed)
[facility] [address], [place] [address] [phone]  Date:  06/01/2015   Name:  HEAVENLEIGH PETRUZZI   DOB:  [birth year]   MRN:  [id number]  PCP:  JEFFERY,CHELLE, PA-C    History of Present Illness:  CHEN HOLZMAN is a 39 y.o. female patient who presents to [facility] for chief complaint of eye redness. This started about yesterday.  Noticed dry crust and watering and right lid swelling at first.  She used compresses to ease the swelling, which helped.  She has no new onset of nasal congestion but does have this chronic with allergies.  No sore throat or fever.     Patient Active Problem List   Diagnosis Date Noted  . 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 GERD (gastroesophageal reflux disease) 03/18/2015  . Vitamin D deficiency 02/04/2015  . Seasonal allergies 02/04/2015  . Multiple food allergies 02/04/2015  . Bunion   . Migraine with aura 01/22/2014  . Hypothyroidism 09/27/2013  . Mild persistent asthma in adult without complication 34/74/2595  . 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 POLYCYSTIC OVARIAN DISEASE 12/23/2006    Past Medical History  Diagnosis Date  . Allergy   . Thyroid disease   . Hypertension   . Asthma   . Polycystic ovarian syndrome   . Heel spur     Both heels, one has fractured off  . Bunion     Past Surgical History  Procedure Laterality Date  . Teratoma excision Left   . Plantar fasciectomy Bilateral 08/2014  . Tubal ligation  2008    Social History  Substance Use Topics  . Smoking status: Never Smoker   . Smokeless tobacco: Never Used  . Alcohol Use: 0.6 oz/week    1 Glasses of wine per week     Comment: 1-4/month    Family History  Problem Relation Age of Onset  . Thyroid disease Mother   . Allergies Mother   . Hypertension Father   . Heart disease Father   . Alcohol abuse Maternal Grandfather   . Hypertension Paternal Grandmother   . Hypertension Paternal Grandfather   . Diabetes Paternal Grandfather   . Hypertension  Brother     Allergies  Allergen Reactions  . Topamax [Topiramate] Other (See Comments)    Flushing, paresthesias (tingling, burning).      Medication list has been reviewed and updated.  Current Outpatient Prescriptions on File Prior to Visit  Medication Sig Dispense Refill  . albuterol (PROVENTIL HFA;VENTOLIN HFA) 108 (90 BASE) MCG/ACT inhaler Inhale 2 puffs into the lungs every 6 (six) hours as needed. 1 Inhaler 3  . beclomethasone (QVAR) 80 MCG/ACT inhaler Inhale 1 puff into the lungs daily as needed. 3 Inhaler 3  . Cholecalciferol (VITAMIN D) 2000 UNITS tablet Take 2,000 Units by mouth daily.    . fluticasone (FLONASE) 50 MCG/ACT nasal spray PLACE 2 SPRAYS INTO BOTH NOSTRILS DAILY. 16 g 1  . levothyroxine (SYNTHROID, LEVOTHROID) 25 MCG tablet TAKE 1 TABLET (25 MCG TOTAL) BY MOUTH DAILY. 90 tablet 3  . metFORMIN (GLUCOPHAGE) 500 MG tablet Take 1 tablet (500 mg total) by mouth 2 (two) times daily. 180 tablet 3  . propranolol ER (INDERAL LA) 60 MG 24 hr capsule Take 1 capsule (60 mg total) by mouth daily. 90 capsule 4  . loratadine (CLARITIN) 10 MG tablet Take 10 mg by mouth daily as needed for allergies.      No current facility-administered medications on file prior to visit.  ROS ROS otherwise unremarkable unless listed above.  Physical Examination: BP 124/80 mmHg  Pulse 85  Temp(Src) 99.5 F (37.5 C) (Oral)  Resp 17  Ht 5' 5.5" (1.664 m)  Wt 217 lb (98.431 kg)  BMI 35.55 kg/m2  SpO2 98%  LMP 05/15/2015 Ideal Body Weight: Weight in (lb) to have BMI = 25: 152.2  Physical Exam  Constitutional: She is oriented to person, place, and time. She appears well-developed and well-nourished. No distress.  HENT:  Head: Normocephalic and atraumatic.  Right Ear: External ear normal.  Left Ear: External ear normal.  Eyes: EOM are normal. Pupils are equal, round, and reactive to light. Right eye exhibits discharge. Left eye exhibits no discharge. Right conjunctiva is injected.  Left conjunctiva is not injected. Right eye exhibits normal extraocular motion and no nystagmus. Left eye exhibits normal extraocular motion and no nystagmus.  Cardiovascular: Normal rate.   Pulmonary/Chest: Effort normal. No respiratory distress.  Neurological: She is alert and oriented to person, place, and time.  Skin: She is not diaphoretic.  Psychiatric: She has a normal mood and affect. Her behavior is normal.     Assessment and Plan: 39 year old female is here today for eye redness.   Possibility of viral, but will cover for bacterial etiology.  -possible conjunctivitis of bacterial etiology and we will treat at this time.  Will not give flu vaccine with recent symptoms at this time.  Advised to return for this once feeling well.  Conjunctivitis of right eye - Plan: trimethoprim-polymyxin b (POLYTRIM) ophthalmic solution   Ivar Drape, PA-C [facility] 9/30/20162:56 PM   Ivar Drape, PA-C [facility] 06/06/2015 2:55 PM

## 2015-08-05 ENCOUNTER — Ambulatory Visit: Payer: Managed Care, Other (non HMO) | Admitting: Physician Assistant

## 2015-08-20 ENCOUNTER — Other Ambulatory Visit: Payer: Self-pay | Admitting: Physician Assistant

## 2015-08-20 ENCOUNTER — Ambulatory Visit: Payer: Managed Care, Other (non HMO) | Admitting: Physician Assistant

## 2015-10-03 ENCOUNTER — Other Ambulatory Visit: Payer: Self-pay | Admitting: Physician Assistant

## 2015-10-09 ENCOUNTER — Other Ambulatory Visit: Payer: Self-pay | Admitting: Physician Assistant

## 2015-10-14 ENCOUNTER — Ambulatory Visit (INDEPENDENT_AMBULATORY_CARE_PROVIDER_SITE_OTHER): Payer: Managed Care, Other (non HMO) | Admitting: Physician Assistant

## 2015-10-14 ENCOUNTER — Encounter: Payer: Self-pay | Admitting: Physician Assistant

## 2015-10-14 VITALS — BP 120/96 | HR 85 | Temp 98.4°F | Resp 16 | Ht 65.5 in | Wt 218.8 lb

## 2015-10-14 DIAGNOSIS — G43109 Migraine with aura, not intractable, without status migrainosus: Secondary | ICD-10-CM | POA: Diagnosis not present

## 2015-10-14 DIAGNOSIS — Z23 Encounter for immunization: Secondary | ICD-10-CM

## 2015-10-14 DIAGNOSIS — E039 Hypothyroidism, unspecified: Secondary | ICD-10-CM | POA: Diagnosis not present

## 2015-10-14 DIAGNOSIS — K219 Gastro-esophageal reflux disease without esophagitis: Secondary | ICD-10-CM

## 2015-10-14 MED ORDER — RANITIDINE HCL 150 MG PO TABS: 150.0000 mg | ORAL_TABLET | Freq: Two times a day (BID) | ORAL | Status: DC

## 2015-10-14 NOTE — Patient Instructions (Addendum)
Try rearranging your workspace so that you sit between the two monitors, rather than in front of one, having to turn toward the other.  Try a heating pad and then the exercises to follow.  Once the headache starts, try an ice pack.   Cervical Strain and Sprain With Rehab Cervical strain and sprain are injuries that commonly occur with "whiplash" injuries. Whiplash occurs when the neck is forcefully whipped backward or forward, such as during a motor vehicle accident or during contact sports. The muscles, ligaments, tendons, discs, and nerves of the neck are susceptible to injury when this occurs. RISK FACTORS Risk of having a whiplash injury increases if:  Osteoarthritis of the spine.  Situations that make head or neck accidents or trauma more likely.  High-risk sports (football, rugby, wrestling, hockey, auto racing, gymnastics, diving, contact karate, or boxing).  Poor strength and flexibility of the neck.  Previous neck injury.  Poor tackling technique.  Improperly fitted or padded equipment. SYMPTOMS   Pain or stiffness in the front or back of neck or both.  Symptoms may present immediately or up to 24 hours after injury.  Dizziness, headache, nausea, and vomiting.  Muscle spasm with soreness and stiffness in the neck.  Tenderness and swelling at the injury site. PREVENTION  Learn and use proper technique (avoid tackling with the head, spearing, and head-butting; use proper falling techniques to avoid landing on the head).  Warm up and stretch properly before activity.  Maintain physical fitness:  Strength, flexibility, and endurance.  Cardiovascular fitness.  Wear properly fitted and padded protective equipment, such as padded soft collars, for participation in contact sports. PROGNOSIS  Recovery from cervical strain and sprain injuries is dependent on the extent of the injury. These injuries are usually curable in 1 week to 3 months with appropriate treatment.   RELATED COMPLICATIONS   Temporary numbness and weakness may occur if the nerve roots are damaged, and this may persist until the nerve has completely healed.  Chronic pain due to frequent recurrence of symptoms.  Prolonged healing, especially if activity is resumed too soon (before complete recovery). TREATMENT  Treatment initially involves the use of ice and medication to help reduce pain and inflammation. It is also important to perform strengthening and stretching exercises and modify activities that worsen symptoms so the injury does not get worse. These exercises may be performed at home or with a therapist. For patients who experience severe symptoms, a soft, padded collar may be recommended to be worn around the neck.  Improving your posture may help reduce symptoms. Posture improvement includes pulling your chin and abdomen in while sitting or standing. If you are sitting, sit in a firm chair with your buttocks against the back of the chair. While sleeping, try replacing your pillow with a small towel rolled to 2 inches in diameter, or use a cervical pillow or soft cervical collar. Poor sleeping positions delay healing.  For patients with nerve root damage, which causes numbness or weakness, the use of a cervical traction apparatus may be recommended. Surgery is rarely necessary for these injuries. However, cervical strain and sprains that are present at birth (congenital) may require surgery. MEDICATION   If pain medication is necessary, nonsteroidal anti-inflammatory medications, such as aspirin and ibuprofen, or other minor pain relievers, such as acetaminophen, are often recommended.  Do not take pain medication for 7 days before surgery.  Prescription pain relievers may be given if deemed necessary by your caregiver. Use only as directed and  only as much as you need. HEAT AND COLD:   Cold treatment (icing) relieves pain and reduces inflammation. Cold treatment should be applied for  10 to 15 minutes every 2 to 3 hours for inflammation and pain and immediately after any activity that aggravates your symptoms. Use ice packs or an ice massage.  Heat treatment may be used prior to performing the stretching and strengthening activities prescribed by your caregiver, physical therapist, or athletic trainer. Use a heat pack or a warm soak. SEEK MEDICAL CARE IF:   Symptoms get worse or do not improve in 2 weeks despite treatment.  New, unexplained symptoms develop (drugs used in treatment may produce side effects). EXERCISES RANGE OF MOTION (ROM) AND STRETCHING EXERCISES - Cervical Strain and Sprain These exercises may help you when beginning to rehabilitate your injury. In order to successfully resolve your symptoms, you must improve your posture. These exercises are designed to help reduce the forward-head and rounded-shoulder posture which contributes to this condition. Your symptoms may resolve with or without further involvement from your physician, physical therapist or athletic trainer. While completing these exercises, remember:   Restoring tissue flexibility helps normal motion to return to the joints. This allows healthier, less painful movement and activity.  An effective stretch should be held for at least 20 seconds, although you may need to begin with shorter hold times for comfort.  A stretch should never be painful. You should only feel a gentle lengthening or release in the stretched tissue. STRETCH- Axial Extensors  Lie on your back on the floor. You may bend your knees for comfort. Place a rolled-up hand towel or dish towel, about 2 inches in diameter, under the part of your head that makes contact with the floor.  Gently tuck your chin, as if trying to make a "double chin," until you feel a gentle stretch at the base of your head.  Hold _____5_____ seconds. Repeat _____5-10_____ times. Complete this exercise ___1-2_____ times per day.  STRETCH - Axial  Extension   Stand or sit on a firm surface. Assume a good posture: chest up, shoulders drawn back, abdominal muscles slightly tense, knees unlocked (if standing) and feet hip width apart.  Slowly retract your chin so your head slides back and your chin slightly lowers. Continue to look straight ahead.  You should feel a gentle stretch in the back of your head. Be certain not to feel an aggressive stretch since this can cause headaches later.  Hold for __________ seconds. Repeat __________ times. Complete this exercise __________ times per day. STRETCH - Cervical Side Bend   Stand or sit on a firm surface. Assume a good posture: chest up, shoulders drawn back, abdominal muscles slightly tense, knees unlocked (if standing) and feet hip width apart.  Without letting your nose or shoulders move, slowly tip your right / left ear to your shoulder until your feel a gentle stretch in the muscles on the opposite side of your neck.  Hold __________ seconds. Repeat __________ times. Complete this exercise __________ times per day. STRETCH - Cervical Rotators   Stand or sit on a firm surface. Assume a good posture: chest up, shoulders drawn back, abdominal muscles slightly tense, knees unlocked (if standing) and feet hip width apart.  Keeping your eyes level with the ground, slowly turn your head until you feel a gentle stretch along the back and opposite side of your neck.  Hold __________ seconds. Repeat __________ times. Complete this exercise __________ times per day. RANGE OF  MOTION - Neck Circles   Stand or sit on a firm surface. Assume a good posture: chest up, shoulders drawn back, abdominal muscles slightly tense, knees unlocked (if standing) and feet hip width apart.  Gently roll your head down and around from the back of one shoulder to the back of the other. The motion should never be forced or painful.  Repeat the motion 10-20 times, or until you feel the neck muscles relax and  loosen. Repeat __________ times. Complete the exercise __________ times per day. STRENGTHENING EXERCISES - Cervical Strain and Sprain These exercises may help you when beginning to rehabilitate your injury. They may resolve your symptoms with or without further involvement from your physician, physical therapist, or athletic trainer. While completing these exercises, remember:   Muscles can gain both the endurance and the strength needed for everyday activities through controlled exercises.  Complete these exercises as instructed by your physician, physical therapist, or athletic trainer. Progress the resistance and repetitions only as guided.  You may experience muscle soreness or fatigue, but the pain or discomfort you are trying to eliminate should never worsen during these exercises. If this pain does worsen, stop and make certain you are following the directions exactly. If the pain is still present after adjustments, discontinue the exercise until you can discuss the trouble with your clinician. STRENGTH - Cervical Flexors, Isometric  Face a wall, standing about 6 inches away. Place a small pillow, a ball about 6-8 inches in diameter, or a folded towel between your forehead and the wall.  Slightly tuck your chin and gently push your forehead into the soft object. Push only with mild to moderate intensity, building up tension gradually. Keep your jaw and forehead relaxed.  Hold 10 to 20 seconds. Keep your breathing relaxed.  Release the tension slowly. Relax your neck muscles completely before you start the next repetition. Repeat __________ times. Complete this exercise __________ times per day. STRENGTH- Cervical Lateral Flexors, Isometric   Stand about 6 inches away from a wall. Place a small pillow, a ball about 6-8 inches in diameter, or a folded towel between the side of your head and the wall.  Slightly tuck your chin and gently tilt your head into the soft object. Push only with  mild to moderate intensity, building up tension gradually. Keep your jaw and forehead relaxed.  Hold 10 to 20 seconds. Keep your breathing relaxed.  Release the tension slowly. Relax your neck muscles completely before you start the next repetition. Repeat __________ times. Complete this exercise __________ times per day. STRENGTH - Cervical Extensors, Isometric   Stand about 6 inches away from a wall. Place a small pillow, a ball about 6-8 inches in diameter, or a folded towel between the back of your head and the wall.  Slightly tuck your chin and gently tilt your head back into the soft object. Push only with mild to moderate intensity, building up tension gradually. Keep your jaw and forehead relaxed.  Hold 10 to 20 seconds. Keep your breathing relaxed.  Release the tension slowly. Relax your neck muscles completely before you start the next repetition. Repeat __________ times. Complete this exercise __________ times per day. POSTURE AND BODY MECHANICS CONSIDERATIONS - Cervical Strain and Sprain Keeping correct posture when sitting, standing or completing your activities will reduce the stress put on different body tissues, allowing injured tissues a chance to heal and limiting painful experiences. The following are general guidelines for improved posture. Your physician or physical therapist will  provide you with any instructions specific to your needs. While reading these guidelines, remember:  The exercises prescribed by your provider will help you have the flexibility and strength to maintain correct postures.  The correct posture provides the optimal environment for your joints to work. All of your joints have less wear and tear when properly supported by a spine with good posture. This means you will experience a healthier, less painful body.  Correct posture must be practiced with all of your activities, especially prolonged sitting and standing. Correct posture is as important when  doing repetitive low-stress activities (typing) as it is when doing a single heavy-load activity (lifting). PROLONGED STANDING WHILE SLIGHTLY LEANING FORWARD When completing a task that requires you to lean forward while standing in one place for a long time, place either foot up on a stationary 2- to 4-inch high object to help maintain the best posture. When both feet are on the ground, the low back tends to lose its slight inward curve. If this curve flattens (or becomes too large), then the back and your other joints will experience too much stress, fatigue more quickly, and can cause pain.  RESTING POSITIONS Consider which positions are most painful for you when choosing a resting position. If you have pain with flexion-based activities (sitting, bending, stooping, squatting), choose a position that allows you to rest in a less flexed posture. You would want to avoid curling into a fetal position on your side. If your pain worsens with extension-based activities (prolonged standing, working overhead), avoid resting in an extended position such as sleeping on your stomach. Most people will find more comfort when they rest with their spine in a more neutral position, neither too rounded nor too arched. Lying on a non-sagging bed on your side with a pillow between your knees, or on your back with a pillow under your knees will often provide some relief. Keep in mind, being in any one position for a prolonged period of time, no matter how correct your posture, can still lead to stiffness. WALKING Walk with an upright posture. Your ears, shoulders, and hips should all line up. OFFICE WORK When working at a desk, create an environment that supports good, upright posture. Without extra support, muscles fatigue and lead to excessive strain on joints and other tissues. CHAIR:  A chair should be able to slide under your desk when your back makes contact with the back of the chair. This allows you to work  closely.  The chair's height should allow your eyes to be level with the upper part of your monitor and your hands to be slightly lower than your elbows.  Body position:  Your feet should make contact with the floor. If this is not possible, use a foot rest.  Keep your ears over your shoulders. This will reduce stress on your neck and low back.   This information is not intended to replace advice given to you by your health care provider. Make sure you discuss any questions you have with your health care provider.   Document Released: 08/23/2005 Document Revised: 09/13/2014 Document Reviewed: 12/05/2008 Elsevier Interactive Patient Education [employer].

## 2015-10-14 NOTE — Progress Notes (Signed)
Patient ID: Erin Warren, female    DOB: [birth year], 40 y.o.   MRN: [id number]  PCP: Joleene Burnham, PA-C  Subjective:   Chief Complaint  Patient presents with  . muscle pain left shoulder    x 4 wks  . Hypothyroidism    HPI Presents for evaluation of hypothyroidism, and pain in the LEFT shoulder.  Hypothyroidism has been stable. No adverse effects of medication.  Migraines tend to manifest as shoulder pain, starting on the LEFT, moving to the RIGHT over 72 hours, and sinus pain. Weather changes are a trigger, and we've had a lot of that lately. "Also, I wonder if there's something about my posture at work that is aggravating it." She sits at a desk with two screens. For the most part, her workspace is ergonomic, though she is centered on one of the screens and has to rotate frequently to look at the other. Going back to one screen is not an option.  OTC Aleve and acetaminophen work fairly well. " I'm wondering if there is something I can do besides take a lot of pain pills." Her husband takes muscle relaxers, and she notes that she can't be sleepy during the day like he is.  No radicular pain into the arm.  Saw the allergist. Eliminated wheat, which has helped a lot, but she continues to have GERD symptoms.   Review of Systems As above. No CP, SOB, dizziness.    Patient Active Problem List   Diagnosis Date Noted  . 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 GERD (gastroesophageal reflux disease) 03/18/2015  . Vitamin D deficiency 02/04/2015  . Seasonal allergies 02/04/2015  . Multiple food allergies 02/04/2015  . Bunion   . Migraine with aura 01/22/2014  . Hypothyroidism 09/27/2013  . Mild persistent asthma in adult without complication [id number]  . 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 POLYCYSTIC OVARIAN DISEASE 12/23/2006     Prior to Admission medications   Medication Sig Start Date End Date Taking? Authorizing Provider  albuterol (PROVENTIL HFA;VENTOLIN HFA) 108 (90 BASE)  MCG/ACT inhaler Inhale 2 puffs into the lungs every 6 (six) hours as needed. 02/04/15  Yes Mekaela Azizi, PA-C  beclomethasone (QVAR) 80 MCG/ACT inhaler Inhale 1 puff into the lungs daily as needed. 02/04/15  Yes Makoto Sellitto, PA-C  Cholecalciferol (VITAMIN D) 2000 UNITS tablet Take 2,000 Units by mouth daily.   Yes Historical Provider, MD  fluticasone (FLONASE) 50 MCG/ACT nasal spray USE 2 SPRAYS IN BOTH NOSTRILS EVERY DAY 10/11/15  Yes Delailah Spieth, PA-C  levothyroxine (SYNTHROID, LEVOTHROID) 25 MCG tablet TAKE 1 TABLET (25 MCG TOTAL) BY MOUTH DAILY. 03/19/15  Yes Jaynee Eagles, PA-C  metFORMIN (GLUCOPHAGE) 500 MG tablet Take 1 tablet (500 mg total) by mouth 2 (two) times daily. 02/04/15  Yes Dametria Tuzzolino, PA-C  propranolol ER (INDERAL LA) 60 MG 24 hr capsule Take 1 capsule (60 mg total) by mouth daily. 02/06/15  Yes Penni Bombard, MD  loratadine (CLARITIN) 10 MG tablet Take 10 mg by mouth daily as needed for allergies. Reported on 10/14/2015    Historical Provider, MD     Allergies  Allergen Reactions  . Topamax [Topiramate] Other (See Comments)    Flushing, paresthesias (tingling, burning).         Objective:  Physical Exam  Constitutional: She is oriented to person, place, and time. She appears well-developed and well-nourished. She is active and cooperative. No distress.  BP 120/96 mmHg  Pulse 85  Temp(Src) 98.4 F (36.9 C) (Oral)  Resp 16  Ht 5' 5.5" (1.664 m)  Wt 218 lb 12.8 oz (99.247 kg)  BMI 35.84 kg/m2  SpO2 98%  LMP 09/23/2015  HENT:  Head: Normocephalic and atraumatic.  Right Ear: Hearing normal.  Left Ear: Hearing normal.  Eyes: Conjunctivae are normal. No scleral icterus.  Neck: Normal range of motion. Neck supple. No thyromegaly present.  Cardiovascular: Normal rate, regular rhythm and normal heart sounds.   Pulses:      Radial pulses are 2+ on the right side, and 2+ on the left side.  Pulmonary/Chest: Effort normal and breath sounds normal.  Musculoskeletal:         Right shoulder: Normal.       Left shoulder: Normal.       Cervical back: She exhibits tenderness and pain. She exhibits normal range of motion, no bony tenderness, no swelling, no edema, no deformity, no laceration, no spasm and normal pulse.       Thoracic back: Normal.       Lumbar back: Normal.  Tenderness along the trapezius, L>R. And up into the lower cervical paraspinous muscles on the LEFT.  Lymphadenopathy:       Head (right side): No tonsillar, no preauricular, no posterior auricular and no occipital adenopathy present.       Head (left side): No tonsillar, no preauricular, no posterior auricular and no occipital adenopathy present.    She has no cervical adenopathy.       Right: No supraclavicular adenopathy present.       Left: No supraclavicular adenopathy present.  Neurological: She is alert and oriented to person, place, and time. No sensory deficit.  Skin: Skin is warm, dry and intact. No rash noted. No cyanosis or erythema. Nails show no clubbing.  Psychiatric: She has a normal mood and affect. Her speech is normal and behavior is normal.           Assessment & Plan:   1. Hypothyroidism, unspecified hypothyroidism type Continue current treatment. Update TSH. - TSH  2. Migraine with aura and without status migrainosus, not intractable This is an interesting presentation of migraine, as she doesn't actually get head pain. Desires to avoid additional medications. Discussed ergonomics and body mechanics, and she'll explore centering the two screens to minimize neck rotation. Also consider heat application to the muscles. If ineffective, try ice. Massage therapy may also be an option.  3. Flu vaccine need - Flu Vaccine QUAD 36+ mos IM  4. Gastroesophageal reflux disease without esophagitis Continue with the lifestyle modifications, and use ranitidine to eliminate persistent symptoms. If ineffective, will test for H pylori and try PPI therapy. - ranitidine  (ZANTAC) 150 MG tablet; Take 1 tablet (150 mg total) by mouth 2 (two) times daily.  Dispense: 60 tablet; Refill: 5   Fara Chute, PA-C Physician Assistant-Certified [facility]

## 2015-10-15 LAB — TSH: TSH: 1.68 mIU/L

## 2015-11-16 ENCOUNTER — Other Ambulatory Visit: Payer: Self-pay | Admitting: Physician Assistant

## 2016-01-03 ENCOUNTER — Other Ambulatory Visit: Payer: Self-pay | Admitting: Physician Assistant

## 2016-01-06 NOTE — Telephone Encounter (Signed)
Meds ordered this encounter  Medications  . fluticasone (FLONASE) 50 MCG/ACT nasal spray    Sig: Place 2 sprays into both nostrils daily.    Dispense:  48 g    Refill:  3

## 2016-01-06 NOTE — Telephone Encounter (Signed)
Chelle you saw pt for check up in Feb, but don't see AR addressed. Do you want to give RFs?

## 2016-02-09 ENCOUNTER — Ambulatory Visit (INDEPENDENT_AMBULATORY_CARE_PROVIDER_SITE_OTHER): Payer: Managed Care, Other (non HMO) | Admitting: Diagnostic Neuroimaging

## 2016-02-09 ENCOUNTER — Telehealth: Payer: Self-pay | Admitting: Diagnostic Neuroimaging

## 2016-02-09 ENCOUNTER — Encounter: Payer: Self-pay | Admitting: Diagnostic Neuroimaging

## 2016-02-09 VITALS — BP 126/88 | HR 81 | Ht 65.5 in | Wt 221.0 lb

## 2016-02-09 DIAGNOSIS — G44209 Tension-type headache, unspecified, not intractable: Secondary | ICD-10-CM | POA: Diagnosis not present

## 2016-02-09 DIAGNOSIS — G43809 Other migraine, not intractable, without status migrainosus: Secondary | ICD-10-CM | POA: Diagnosis not present

## 2016-02-09 DIAGNOSIS — G43109 Migraine with aura, not intractable, without status migrainosus: Secondary | ICD-10-CM | POA: Insufficient documentation

## 2016-02-09 MED ORDER — RIZATRIPTAN BENZOATE 10 MG PO TBDP: 10.0000 mg | ORAL_TABLET | ORAL | Status: DC | PRN

## 2016-02-09 MED ORDER — PROPRANOLOL HCL ER 80 MG PO CP24: 80.0000 mg | ORAL_CAPSULE | Freq: Every day | ORAL | Status: DC

## 2016-02-09 NOTE — Telephone Encounter (Signed)
FYI-Pt will call to schedule pmt arrangement and make 6 month f/u for Dec. 2017.

## 2016-02-09 NOTE — Patient Instructions (Signed)
-   increase propranolol to 80mg  daily - try rizatriptan 10mg  as needed for breakthrough headache; may repeat x 1 after 2 hours; max 2 tabs per day or 8 per month

## 2016-02-09 NOTE — Progress Notes (Signed)
[facility]  PATIENT: Erin Warren DOB: Dec 19, 1975  REFERRING CLINICIAN:  HISTORY FROM: patient  REASON FOR VISIT: follow up   HISTORICAL  CHIEF COMPLAINT:  Chief Complaint  Patient presents with  . Migraine    rm 7, sig other - [place], "more migraine days than not t his past yr; weather-related"  . Follow-up    one year    HISTORY OF PRESENT ILLNESS:   UPDATE 02/09/16 (VRP): Since last visit, now avg 15-20 days per month of mild HA (since Nov / Dec 2016), esp with weather changes. Severe migraine HA are only 1 per month or 1 every 2-3 months. Now having some diff with work responsibilities due to headaches, sometimes missing work or leaving early. May need FMLA paperwork (1-4 days per month, intermittent leave).  UPDATE 02/06/15 (VRP): Doing well. Avg 1-4 days /month of HA; worse with overcast weather. Tolerating propranolol. Using OTC aleve and tylenol for breakthrough HA.   UPDATE 01/22/14 (LL): She could not tolerate SE of Topamax and was changed to Propranolol with benefit. Headaches are reduced to 2-3 per week instead of almost every day. Headaches have not been as incapacitating; she is able to function better now. 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 This is likely an incidental finding.   PRIOR HPI (10/17/13, VRP): 40 year old right-handed female with history polycystic ovarian disease, teratoma, asthma, hypertension, here for evaluation of headaches. Patient has had intermittent headaches since age 40 years old. Typically they are pressure, frontal and bitemporal headaches, one per month lasting one hour at a time. Sometimes she has nausea, fatigue, sees sparkles of light with the bad headaches. Vision tends to be photosensitive generally speaking. No phonophobia. Sometimes she has shoulder and neck pain, sometimes sinus congestion with these headaches. Over past 6-7 months  patient has had change in her headaches. Now they are more severe and more frequent. Now she's having headaches every 2-3 days, lasting hours or one day at a time. She's been using naproxen 2-3 times per week to help with headaches. Triggering factors include menstrual cycle, change in weather, stress. Family history positive for migraine in patient's maternal aunt. Patient's mother has similar headaches as well but not officially diagnosed with migraine.    REVIEW OF SYSTEMS: Full 14 system review of systems performed and negative except for headache food all env all.   ALLERGIES: Allergies  Allergen Reactions  . Topamax [Topiramate] Other (See Comments)    Flushing, paresthesias (tingling, burning).      HOME MEDICATIONS: Outpatient Prescriptions Prior to Visit  Medication Sig Dispense Refill  . albuterol (PROVENTIL HFA;VENTOLIN HFA) 108 (90 BASE) MCG/ACT inhaler Inhale 2 puffs into the lungs every 6 (six) hours as needed. 1 Inhaler 3  . beclomethasone (QVAR) 80 MCG/ACT inhaler Inhale 1 puff into the lungs daily as needed. 3 Inhaler 3  . Cholecalciferol (VITAMIN D) 2000 UNITS tablet Take 2,000 Units by mouth daily.    . fluticasone (FLONASE) 50 MCG/ACT nasal spray Place 2 sprays into both nostrils daily. 48 g 3  . levothyroxine (SYNTHROID, LEVOTHROID) 25 MCG tablet TAKE 1 TABLET (25 MCG TOTAL) BY MOUTH DAILY. 90 tablet 3  . metFORMIN (GLUCOPHAGE) 500 MG tablet Take 1 tablet (500 mg total) by mouth 2 (two) times daily. 180 tablet 3  . propranolol ER (INDERAL LA) 60 MG 24 hr capsule Take 1 capsule (60 mg total) by mouth daily. 90 capsule 4  .  ranitidine (ZANTAC) 150 MG tablet Take 1 tablet (150 mg total) by mouth 2 (two) times daily. 60 tablet 5  . loratadine (CLARITIN) 10 MG tablet Take 10 mg by mouth daily as needed for allergies. Reported on 10/14/2015     No facility-administered medications prior to visit.    PAST MEDICAL HISTORY: Past Medical History  Diagnosis Date  . Allergy     . Thyroid disease   . Hypertension   . Asthma   . Polycystic ovarian syndrome   . Heel spur     Both heels, one has fractured off  . Bunion     PAST SURGICAL HISTORY: Past Surgical History  Procedure Laterality Date  . Teratoma excision Left   . Plantar fasciectomy Bilateral 08/2014  . Tubal ligation  2008    FAMILY HISTORY: Family History  Problem Relation Age of Onset  . Thyroid disease Mother   . Allergies Mother   . Hypertension Father   . Heart disease Father   . Alcohol abuse Maternal Grandfather   . Hypertension Paternal Grandmother   . Hypertension Paternal Grandfather   . Diabetes Paternal Grandfather   . Hypertension Brother     SOCIAL HISTORY:  Social History   Social History  . Marital Status: Significant Other    Spouse Name: Youlanda [facility]  . Number of Children: 0  . Years of Education: College gr   Occupational History  . 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 Smoking status: Never Smoker   . Smokeless tobacco: Never Used  . Alcohol Use: 0.6 oz/week    1 Glasses of wine per week     Comment: 1-4/month  . Drug Use: No  . Sexual Activity:    Partners: Male    Birth Control/ Protection: Surgical   Other Topics Concern  . Not on file   Social History Narrative   Patient lives at home with domestic partner.   Caffeine Use: 1-2 cups of coffee a day     PHYSICAL EXAM  Filed Vitals:   02/09/16 1304  BP: 126/88  Pulse: 81  Height: 5' 5.5" (1.664 m)  Weight: 221 lb (100.245 kg)   Body mass index is 36.2 kg/(m^2).  Wt Readings from Last 3 Encounters:  02/09/16 221 lb (100.245 kg)  10/14/15 218 lb 12.8 oz (99.247 kg)  06/01/15 217 lb (98.431 kg)   No exam data present  No flowsheet data found.  GENERAL EXAM: Patient is in no distress; well developed, nourished and groomed; neck is supple  CARDIOVASCULAR: Regular rate and rhythm, no murmurs, no carotid  bruits  NEUROLOGIC: MENTAL STATUS: awake, alert, language fluent, comprehension intact, naming intact, fund of knowledge appropriate CRANIAL NERVE: no papilledema on fundoscopic exam, pupils equal and reactive to light, visual fields full to confrontation, extraocular muscles intact, no nystagmus, facial sensation and strength symmetric, hearing intact, palate elevates symmetrically, uvula midline, shoulder shrug symmetric, tongue midline. MOTOR: normal bulk and tone, full strength in the BUE, BLE SENSORY: normal and symmetric to light touch, temperature, vibration  COORDINATION: finger-nose-finger, fine finger movements normal REFLEXES: deep tendon reflexes present and symmetric GAIT/STATION: narrow based gait; able to walk tandem; romberg is negative    DIAGNOSTIC DATA (LABS, IMAGING, TESTING) - I reviewed patient records, labs, notes, testing and imaging myself where available.  Lab Results  Component Value Date   WBC 9.0 02/04/2015   HGB 13.9 02/04/2015   HCT 40.2 02/04/2015  MCV 90.5 02/04/2015   PLT 400 02/04/2015      Component Value Date/Time   NA 140 02/04/2015 1109   K 4.2 02/04/2015 1109   CL 107 02/04/2015 1109   CO2 23 02/04/2015 1109   GLUCOSE 92 02/04/2015 1109   BUN 10 02/04/2015 1109   CREATININE 0.70 02/04/2015 1109   CREATININE 0.76 01/04/2007 2027   CALCIUM 9.2 02/04/2015 1109   PROT 6.8 02/04/2015 1109   ALBUMIN 3.8 02/04/2015 1109   AST 14 02/04/2015 1109   ALT 12 02/04/2015 1109   ALKPHOS 75 02/04/2015 1109   BILITOT 0.3 02/04/2015 1109   Lab Results  Component Value Date   CHOL 151 02/04/2015   HDL 55 02/04/2015   LDLCALC 73 02/04/2015   TRIG 114 02/04/2015   CHOLHDL 2.7 02/04/2015   Lab Results  Component Value Date   HGBA1C 4.7 09/27/2013   No results found for: VITAMINB12 Lab Results  Component Value Date   TSH 1.68 10/14/2015    2/10/11/13 MRI brain (with and without) demonstrating: 1. There is T1 and T2 hyperintense lesion  (measuring 1.2x1.3x1.0cm) in the right posterior sphenoid sinus region. This abuts the adjacent pituitary gland. On sagittal views, this may be contiguous with the normal pituitary tissue, but on axial views if appears distinct. No abnormal enhancement. Considerations include sphenoid sinus inflammatory disease or mucocele. Less likely represents an intrasellar/pituitary mass with intrasphenoidal extension. 2. Remainder of brain parenchyma is unremarkable.  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 Considerations would include atypical hemangioma, or focal fibrous dysplasia. No evidence for expansile lesion encroaching on the surrounding neural or vascular structures. The lesion does not appear to represent a aggressive neoplasm such as chordoma.     ASSESSMENT AND PLAN  40 y.o. year old female here with history of headaches since age 53 years old, with worsening severity and frequency of headaches since 2015. Headaches have some migraine features (nausea, long duration, photosensitivity, seeing sparkling lights). Some worsening since Nov/Dec 2016.  Dx:  Migraine with aura and without status migrainosus, not intractable  Tension headache     PLAN: - increase propranolol ER to 80mg  daily - try rizatriptan as needed for migraine rescue - continue OTC aleve and tylenol as needed - Now having some diff with work responsibilities due to headaches, sometimes missing work or leaving early. May need FMLA paperwork (avg 1-4 days per month, intermittent leave).  Meds ordered this encounter  Medications  . propranolol ER (INDERAL LA) 80 MG 24 hr capsule    Sig: Take 1 capsule (80 mg total) by mouth daily.    Dispense:  30 capsule    Refill:  12  . rizatriptan (MAXALT-MLT) 10 MG disintegrating tablet    Sig: Take 1 tablet (10 mg total) by mouth as needed for migraine. May repeat in 2 hours if needed    Dispense:  9 tablet    Refill:   11   Return in about 6 months (around 08/10/2016).    Penni Bombard, MD [id number], [id number] PM Certified in Neurology, Neurophysiology and Neuroimaging  [facility] [address] [place], [place] [address] [phone]

## 2016-02-10 ENCOUNTER — Encounter: Payer: Self-pay | Admitting: Physician Assistant

## 2016-02-10 ENCOUNTER — Ambulatory Visit (INDEPENDENT_AMBULATORY_CARE_PROVIDER_SITE_OTHER): Payer: Managed Care, Other (non HMO) | Admitting: Physician Assistant

## 2016-02-10 VITALS — BP 124/100 | HR 80 | Temp 99.2°F | Resp 16 | Ht 65.5 in | Wt 220.8 lb

## 2016-02-10 DIAGNOSIS — G43109 Migraine with aura, not intractable, without status migrainosus: Secondary | ICD-10-CM

## 2016-02-10 DIAGNOSIS — K219 Gastro-esophageal reflux disease without esophagitis: Secondary | ICD-10-CM

## 2016-02-10 DIAGNOSIS — E282 Polycystic ovarian syndrome: Secondary | ICD-10-CM

## 2016-02-10 NOTE — Patient Instructions (Signed)
     IF you received an x-ray today, you will receive an invoice from [facility]. Please contact [facility] at [phone] with questions or concerns regarding your invoice.   IF you received labwork today, you will receive an invoice from [facility]. Please contact [facility] at [phone] with questions or concerns regarding your invoice.   Our billing staff will not be able to assist you with questions regarding bills from these companies.  You will be contacted with the lab results as soon as they are available. The fastest way to get your results is to activate your My Chart account. Instructions are located on the last page of this paperwork. If you have not heard from us regarding the results in 2 weeks, please contact this office.      

## 2016-02-10 NOTE — Progress Notes (Signed)
Patient ID: Erin Warren, female    DOB: [birth year], 40 y.o.   MRN: [id number]  PCP: Wynne Dust  Subjective:   Chief Complaint  Patient presents with  . Follow-up  . Hypothyroidism  . pt states she has more headaches than not  . Reflux    "better"  . left shoulder pain    "better"    HPI Presents for evaluation of hypothyroidism. She feels good on the current dose. She is accompanied by her significant other.  TSH was performed in 10/2015 and was normal.  Saw Dr. Leta Baptist yesterday. Increased Inderal from 60 to 80 mg, for better control of migraine headaches..  Resetting her work station has helped with the shoulder pain. Sometimes still sits in poor posture, and is working to retrain herself.  Reflux is also better. Takes the ranitidine regularly, and tries to avoid foods that exacerbate the symptoms.   Review of Systems  Constitutional: Negative for activity change, appetite change, fatigue and unexpected weight change.  HENT: Negative for congestion, dental problem, ear pain, hearing loss, mouth sores, postnasal drip, rhinorrhea, sneezing, sore throat, tinnitus and trouble swallowing.   Eyes: Negative for photophobia, pain, redness and visual disturbance.  Respiratory: Negative for cough, chest tightness and shortness of breath.   Cardiovascular: Negative for chest pain, palpitations and leg swelling.  Gastrointestinal: Negative for nausea, vomiting, abdominal pain, diarrhea, constipation and blood in stool.  Endocrine: Negative.   Genitourinary: Negative for dysuria, urgency, frequency and hematuria.  Musculoskeletal: Negative for myalgias, arthralgias, gait problem and neck stiffness.  Skin: Negative for rash.  Neurological: Negative for dizziness, speech difficulty, weakness, light-headedness, numbness and headaches.  Hematological: Negative for adenopathy.  Psychiatric/Behavioral: Negative for confusion and sleep disturbance. The patient is not  nervous/anxious.        Patient Active Problem List   Diagnosis Date Noted  . Migraine with aura and without status migrainosus, not intractable 02/09/2016  . 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 GERD (gastroesophageal reflux disease) 03/18/2015  . Vitamin D deficiency 02/04/2015  . Seasonal allergies 02/04/2015  . Multiple food allergies 02/04/2015  . Bunion   . Migraine with aura 01/22/2014  . Hypothyroidism 09/27/2013  . Mild persistent asthma in adult without complication 123456  . 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 POLYCYSTIC OVARIAN DISEASE 12/23/2006     Prior to Admission medications   Medication Sig Start Date End Date Taking? Authorizing Provider  albuterol (PROVENTIL HFA;VENTOLIN HFA) 108 (90 BASE) MCG/ACT inhaler Inhale 2 puffs into the lungs every 6 (six) hours as needed. 02/04/15  Yes Royce Sciara, PA-C  beclomethasone (QVAR) 80 MCG/ACT inhaler Inhale 1 puff into the lungs daily as needed. 02/04/15  Yes Theus Espin, PA-C  Cholecalciferol (VITAMIN D) 2000 UNITS tablet Take 2,000 Units by mouth daily.   Yes Historical Provider, MD  fluticasone (FLONASE) 50 MCG/ACT nasal spray Place 2 sprays into both nostrils daily. 01/06/16  Yes Ivon Oelkers, PA-C  levothyroxine (SYNTHROID, LEVOTHROID) 25 MCG tablet TAKE 1 TABLET (25 MCG TOTAL) BY MOUTH DAILY. 03/19/15  Yes Jaynee Eagles, PA-C  metFORMIN (GLUCOPHAGE) 500 MG tablet Take 1 tablet (500 mg total) by mouth 2 (two) times daily. 02/04/15  Yes Lylia Karn, PA-C  propranolol ER (INDERAL LA) 80 MG 24 hr capsule Take 1 capsule (80 mg total) by mouth daily. 02/09/16  Yes Penni Bombard, MD  ranitidine (ZANTAC) 150 MG tablet Take 1 tablet (150 mg total) by mouth 2 (two) times daily. 10/14/15  Yes Elias Dennington, PA-C  rizatriptan (MAXALT-MLT)  10 MG disintegrating tablet Take 1 tablet (10 mg total) by mouth as needed for migraine. May repeat in 2 hours if needed 02/09/16  Yes Penni Bombard, MD     Allergies  Allergen Reactions  .  Topamax [Topiramate] Other (See Comments)    Flushing, paresthesias (tingling, burning).         Objective:  Physical Exam  Constitutional: She is oriented to person, place, and time. She appears well-developed and well-nourished. She is active and cooperative. No distress.  BP 124/100 mmHg  Pulse 80  Temp(Src) 99.2 F (37.3 C) (Oral)  Resp 16  Ht 5' 5.5" (1.664 m)  Wt 220 lb 12.8 oz (100.154 kg)  BMI 36.17 kg/m2  SpO2 98%  LMP 01/30/2016  HENT:  Head: Normocephalic and atraumatic.  Right Ear: Hearing normal.  Left Ear: Hearing normal.  Eyes: Conjunctivae are normal. No scleral icterus.  Neck: Normal range of motion. Neck supple. No thyromegaly present.  Cardiovascular: Normal rate, regular rhythm and normal heart sounds.   Pulses:      Radial pulses are 2+ on the right side, and 2+ on the left side.  Pulmonary/Chest: Effort normal and breath sounds normal.  Lymphadenopathy:       Head (right side): No tonsillar, no preauricular, no posterior auricular and no occipital adenopathy present.       Head (left side): No tonsillar, no preauricular, no posterior auricular and no occipital adenopathy present.    She has no cervical adenopathy.       Right: No supraclavicular adenopathy present.       Left: No supraclavicular adenopathy present.  Neurological: She is alert and oriented to person, place, and time. No sensory deficit.  Skin: Skin is warm, dry and intact. No rash noted. No cyanosis or erythema. Nails show no clubbing.  Psychiatric: She has a normal mood and affect. Her speech is normal and behavior is normal.           Assessment & Plan:   1. Gastroesophageal reflux disease without esophagitis Controlled. Continue lifestyle changes and ranitidine.  2. Migraine with aura and without status migrainosus, not intractable Continue current treatment per Dr. Leta Baptist.  3. POLYCYSTIC OVARIAN DISEASE Update labs.  - Comprehensive metabolic panel   Fara Chute, PA-C Physician Assistant-Certified [facility]

## 2016-02-11 LAB — COMPREHENSIVE METABOLIC PANEL
ALK PHOS: 72 U/L (ref 33–115)
ALT: 11 U/L (ref 6–29)
AST: 11 U/L (ref 10–30)
Albumin: 3.9 g/dL (ref 3.6–5.1)
BILIRUBIN TOTAL: 0.3 mg/dL (ref 0.2–1.2)
BUN: 14 mg/dL (ref 7–25)
CALCIUM: 9.5 mg/dL (ref 8.6–10.2)
CO2: 21 mmol/L (ref 20–31)
CREATININE: 0.67 mg/dL (ref 0.50–1.10)
Chloride: 103 mmol/L (ref 98–110)
GLUCOSE: 80 mg/dL (ref 65–99)
Potassium: 4 mmol/L (ref 3.5–5.3)
SODIUM: 137 mmol/L (ref 135–146)
Total Protein: 7 g/dL (ref 6.1–8.1)

## 2016-03-16 ENCOUNTER — Other Ambulatory Visit: Payer: Self-pay | Admitting: Urgent Care

## 2016-03-22 ENCOUNTER — Other Ambulatory Visit: Payer: Self-pay

## 2016-03-22 DIAGNOSIS — E282 Polycystic ovarian syndrome: Secondary | ICD-10-CM

## 2016-03-22 DIAGNOSIS — J302 Other seasonal allergic rhinitis: Secondary | ICD-10-CM

## 2016-03-22 DIAGNOSIS — K219 Gastro-esophageal reflux disease without esophagitis: Secondary | ICD-10-CM

## 2016-03-22 MED ORDER — METFORMIN HCL 500 MG PO TABS: 500.0000 mg | ORAL_TABLET | Freq: Two times a day (BID) | ORAL | Status: DC

## 2016-03-22 MED ORDER — RANITIDINE HCL 150 MG PO TABS: 150.0000 mg | ORAL_TABLET | Freq: Two times a day (BID) | ORAL | Status: DC

## 2016-03-22 MED ORDER — BECLOMETHASONE DIPROPIONATE 80 MCG/ACT IN AERS: 1.0000 | INHALATION_SPRAY | Freq: Every day | RESPIRATORY_TRACT | Status: DC | PRN

## 2016-03-22 NOTE — Telephone Encounter (Signed)
Refill sent to [facility]; QVAR,  RANITIDINE 150mg ,  METFORMIN HCL 500mg 

## 2016-08-17 ENCOUNTER — Encounter: Payer: Self-pay | Admitting: Physician Assistant

## 2016-08-17 ENCOUNTER — Ambulatory Visit (INDEPENDENT_AMBULATORY_CARE_PROVIDER_SITE_OTHER): Payer: Managed Care, Other (non HMO) | Admitting: Physician Assistant

## 2016-08-17 VITALS — BP 130/72 | HR 91 | Temp 98.8°F | Resp 16 | Ht 65.75 in | Wt 229.4 lb

## 2016-08-17 DIAGNOSIS — Z23 Encounter for immunization: Secondary | ICD-10-CM | POA: Diagnosis not present

## 2016-08-17 DIAGNOSIS — E559 Vitamin D deficiency, unspecified: Secondary | ICD-10-CM | POA: Diagnosis not present

## 2016-08-17 DIAGNOSIS — M25522 Pain in left elbow: Secondary | ICD-10-CM

## 2016-08-17 DIAGNOSIS — Z13220 Encounter for screening for lipoid disorders: Secondary | ICD-10-CM | POA: Diagnosis not present

## 2016-08-17 DIAGNOSIS — E039 Hypothyroidism, unspecified: Secondary | ICD-10-CM

## 2016-08-17 NOTE — Progress Notes (Signed)
Patient ID: Erin Warren, female    DOB: September 26, 1975, 40 y.o.   MRN: [id number]  PCP: Harrison Mons, PA-C  Chief Complaint  Patient presents with  . Hypothyroidism    Subjective:   Presents for evaluation of hypothyroidism.  She feels well in general, tolerating medications without difficulty. 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  Also complains of LEFT elbow pain. She is LEFT hand dominant. Had a radial head fracture about 20 years ago. Has been having pain now for about 2 months. Achy, enough to be annoying, but not to prevent her from doing the things she really wants to. Worse with writing, leaning on it, full flexion. A brace helps. Ibuprofen helps some. No specific recalled injury, though she describes herself as clumsy. Is learning [place], and is doing a lot more writing (at least 60 minutes/day).   Review of Systems As above. No chest pain, SOB, HA, dizziness, vision change, N/V, diarrhea, constipation, dysuria, urinary urgency or frequency, myalgias, or rash.     Patient Active Problem List   Diagnosis Date Noted  . Migraine with aura and without status migrainosus, not intractable 02/09/2016  . 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 GERD (gastroesophageal reflux disease) 03/18/2015  . Vitamin D deficiency 02/04/2015  . Seasonal allergies 02/04/2015  . Multiple food allergies 02/04/2015  . Bunion   . Migraine with aura 01/22/2014  . Hypothyroidism 09/27/2013  . Mild persistent asthma in adult without complication 123456  . 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 POLYCYSTIC OVARIAN DISEASE 12/23/2006     Prior to Admission medications   Medication Sig Start Date End Date Taking? Authorizing Provider  albuterol (PROVENTIL HFA;VENTOLIN HFA) 108 (90 BASE) MCG/ACT inhaler Inhale 2 puffs into the lungs every 6 (six) hours as needed. 02/04/15   Juergen Hardenbrook, PA-C  beclomethasone (QVAR) 80 MCG/ACT inhaler Inhale 1 puff into the lungs daily as needed.  03/22/16   Blanca Carreon, PA-C  Cholecalciferol (VITAMIN D) 2000 UNITS tablet Take 2,000 Units by mouth daily.    Historical Provider, MD  fluticasone (FLONASE) 50 MCG/ACT nasal spray Place 2 sprays into both nostrils daily. 01/06/16   Orien Mayhall, PA-C  levothyroxine (SYNTHROID, LEVOTHROID) 25 MCG tablet TAKE 1 TABLET (25 MCG TOTAL) BY MOUTH DAILY. 03/18/16   Jeri Rawlins, PA-C  metFORMIN (GLUCOPHAGE) 500 MG tablet Take 1 tablet (500 mg total) by mouth 2 (two) times daily. 03/22/16   Celie Desrochers, PA-C  propranolol ER (INDERAL LA) 80 MG 24 hr capsule Take 1 capsule (80 mg total) by mouth daily. 02/09/16   Penni Bombard, MD  ranitidine (ZANTAC) 150 MG tablet Take 1 tablet (150 mg total) by mouth 2 (two) times daily. 03/22/16   Ryan Palermo, PA-C  rizatriptan (MAXALT-MLT) 10 MG disintegrating tablet Take 1 tablet (10 mg total) by mouth as needed for migraine. May repeat in 2 hours if needed 02/09/16   Penni Bombard, MD     Allergies  Allergen Reactions  . Topamax [Topiramate] Other (See Comments)    Flushing, paresthesias (tingling, burning).         Objective:  Physical Exam  Constitutional: She is oriented to person, place, and time. She appears well-developed and well-nourished. She is active and cooperative. No distress.  BP (!) 130/102 (BP Location: Left Arm, Cuff Size: Large)   Pulse 91   Temp 98.8 F (37.1 C) (Oral)   Resp 16   Ht 5' 5.75" (1.67 m)   Wt 229 lb 6.4 oz (104.1 kg)  LMP 08/05/2016   SpO2 94%   BMI 37.31 kg/m  Waist circumference 40 inches  HENT:  Head: Normocephalic and atraumatic.  Right Ear: Hearing normal.  Left Ear: Hearing normal.  Eyes: Conjunctivae are normal. No scleral icterus.  Neck: Normal range of motion, full passive range of motion without pain and phonation normal. Neck supple. No thyroid mass and no thyromegaly present.  Cardiovascular: Normal rate, regular rhythm and normal heart sounds.   Pulses:      Radial pulses are 2+ on the  right side, and 2+ on the left side.  Pulmonary/Chest: Effort normal and breath sounds normal.  Musculoskeletal:       Left elbow: She exhibits normal range of motion, no swelling, no effusion, no deformity and no laceration. Tenderness found. Olecranon process tenderness noted. No radial head, no medial epicondyle and no lateral epicondyle (mild, "feels weird") tenderness noted.  Lymphadenopathy:       Head (right side): No tonsillar, no preauricular, no posterior auricular and no occipital adenopathy present.       Head (left side): No tonsillar, no preauricular, no posterior auricular and no occipital adenopathy present.    She has no cervical adenopathy.       Right: No supraclavicular adenopathy present.       Left: No supraclavicular adenopathy present.  Neurological: She is alert and oriented to person, place, and time. She has normal strength. No cranial nerve deficit or sensory deficit.  Skin: Skin is warm, dry and intact. No rash noted. No cyanosis or erythema. Nails show no clubbing.  Psychiatric: She has a normal mood and affect. Her speech is normal and behavior is normal.           Assessment & Plan:   1. Hypothyroidism, unspecified type Await labs. Adjust regimen as indicated by results. - TSH - Comprehensive metabolic panel  2. Vitamin D deficiency Await labs. Adjust regimen as indicated by results. - VITAMIN D 25 Hydroxy (Vit-D Deficiency, Fractures)  3. Need for influenza vaccination - Flu Vaccine QUAD 36+ mos PF IM (Fluarix & Fluzone Quad PF)  4. Screening for hyperlipidemia Normal 2016. Await lab results. - Lipid panel  6. Elbow pain, left She will continue the brace and ibuprofen. Reduce the writing. If symptoms persist, RTC for re-evaluation with radiographs.    Return in about 6 months (around 02/15/2017), or if symptoms worsen or fail to improve.    Fara Chute, PA-C Physician Assistant-Certified [facility]

## 2016-08-17 NOTE — Patient Instructions (Addendum)
Keep using the new, better fitting brace. Continue the ibuprofen, and take a break from the writing (or at least reduce it a lot). If the pain persists, return for re-evaluation and probably xrays.    IF you received an x-ray today, you will receive an invoice from [facility]. Please contact [facility] at [phone] with questions or concerns regarding your invoice.   IF you received labwork today, you will receive an invoice from [employer]. Please contact [facility] at [phone] with questions or concerns regarding your invoice.   Our billing staff will not be able to assist you with questions regarding bills from these companies.  You will be contacted with the lab results as soon as they are available. The fastest way to get your results is to activate your My Chart account. Instructions are located on the last page of this paperwork. If you have not heard from [name] regarding the results in 2 weeks, please contact this office.    Influenza (Flu) Vaccine (Inactivated or Recombinant): What You Need to Know 1. Why get vaccinated? Influenza ("flu") is a contagious disease that spreads around the [place] every year, usually between October and May. Flu is caused by influenza viruses, and is spread mainly by coughing, sneezing, and close contact. Anyone can get flu. Flu strikes suddenly and can last several days. Symptoms vary by age, but can include:  fever/chills  sore throat  muscle aches  fatigue  cough  headache  runny or stuffy nose Flu can also lead to pneumonia and blood infections, and cause diarrhea and seizures in children. If you have a medical condition, such as heart or lung disease, flu can make it worse. Flu is more dangerous for some people. Infants and young children, people 30 years of age and older, pregnant women, and people with certain health conditions or a weakened immune system are at greatest risk. Each  year thousands of people in the [place] die from flu, and many more are hospitalized. Flu vaccine can:  keep you from getting flu,  make flu less severe if you do get it, and  keep you from spreading flu to your family and other people. 2. Inactivated and recombinant flu vaccines A dose of flu vaccine is recommended every flu season. Children 6 months through 70 years of age may need two doses during the same flu season. Everyone else needs only one dose each flu season. Some inactivated flu vaccines contain a very small amount of a mercury-based preservative called thimerosal. Studies have not shown thimerosal in vaccines to be harmful, but flu vaccines that do not contain thimerosal are available. There is no live flu virus in flu shots. They cannot cause the flu. There are many flu viruses, and they are always changing. Each year a new flu vaccine is made to protect against three or four viruses that are likely to cause disease in the upcoming flu season. But even when the vaccine doesn't exactly match these viruses, it may still provide some protection. Flu vaccine cannot prevent:  flu that is caused by a virus not covered by the vaccine, or  illnesses that look like flu but are not. It takes about 2 weeks for protection to develop after vaccination, and protection lasts through the flu season. 3. Some people should not get this vaccine Tell the person who is giving you the vaccine:  If you have any severe, life-threatening allergies. If you ever had a life-threatening allergic reaction after a  dose of flu vaccine, or have a severe allergy to any part of this vaccine, you may be advised not to get vaccinated. Most, but not all, types of flu vaccine contain a small amount of egg protein.  If you ever had Guillain-Barr Syndrome (also called GBS). Some people with a history of GBS should not get this vaccine. This should be discussed with your doctor.  If you are not feeling well. It  is usually okay to get flu vaccine when you have a mild illness, but you might be asked to come back when you feel better. 4. Risks of a vaccine reaction With any medicine, including vaccines, there is a chance of reactions. These are usually mild and go away on their own, but serious reactions are also possible. Most people who get a flu shot do not have any problems with it. Minor problems following a flu shot include:  soreness, redness, or swelling where the shot was given  hoarseness  sore, red or itchy eyes  cough  fever  aches  headache  itching  fatigue If these problems occur, they usually begin soon after the shot and last 1 or 2 days. More serious problems following a flu shot can include the following:  There may be a small increased risk of Guillain-Barre Syndrome (GBS) after inactivated flu vaccine. This risk has been estimated at 1 or 2 additional cases per million people vaccinated. This is much lower than the risk of severe complications from flu, which can be prevented by flu vaccine.  Young children who get the flu shot along with pneumococcal vaccine (PCV13) and/or DTaP vaccine at the same time might be slightly more likely to have a seizure caused by fever. Ask your doctor for more information. Tell your doctor if a child who is getting flu vaccine has ever had a seizure. Problems that could happen after any injected vaccine:  People sometimes faint after a medical procedure, including vaccination. Sitting or lying down for about 15 minutes can help prevent fainting, and injuries caused by a fall. Tell your doctor if you feel dizzy, or have vision changes or ringing in the ears.  Some people get severe pain in the shoulder and have difficulty moving the arm where a shot was given. This happens very rarely.  Any medication can cause a severe allergic reaction. Such reactions from a vaccine are very rare, estimated at about 1 in a million doses, and would happen  within a few minutes to a few hours after the vaccination. As with any medicine, there is a very remote chance of a vaccine causing a serious injury or death. The safety of vaccines is always being monitored. For more information, visit: http://www.aguilar.org/ 5. What if there is a serious reaction? What should I look for? Look for anything that concerns you, such as signs of a severe allergic reaction, very high fever, or unusual behavior. Signs of a severe allergic reaction can include hives, swelling of the face and throat, difficulty breathing, a fast heartbeat, dizziness, and weakness. These would start a few minutes to a few hours after the vaccination. What should I do?  If you think it is a severe allergic reaction or other emergency that can't wait, call 9-1-1 and get the person to the nearest hospital. Otherwise, call your doctor.  Reactions should be reported to the Vaccine Adverse Event Reporting System (VAERS). Your doctor should file this report, or you can do it yourself through the VAERS web site at www.vaers.SamedayNews.es,  or by calling 980-096-1207.  VAERS does not give medical advice. 6. The National Vaccine Injury Compensation Program The [employer] Vaccine Injury Compensation Program (VICP) is a federal program that was created to compensate people who may have been injured by certain vaccines. Persons who believe they may have been injured by a vaccine can learn about the program and about filing a claim by calling 240-538-1247 or visiting the [place] website at GoldCloset.com.ee. There is a time limit to file a claim for compensation. 7. How can I learn more?  Ask your healthcare provider. He or she can give you the vaccine package insert or suggest other sources of information.  Call your local or state health department.  Contact the Centers for Disease Control and Prevention (CDC):  Call 628-778-9124 (1-800-CDC-INFO) or  Visit CDC's website at  https://gibson.com/ Vaccine Information Statement, Inactivated Influenza Vaccine (04/12/2014) This information is not intended to replace advice given to you by your health care provider. Make sure you discuss any questions you have with your health care provider. Document Released: 06/17/2006 Document Revised: 05/13/2016 Document Reviewed: 05/13/2016 Elsevier Interactive Patient Education  2017 [employer].

## 2016-08-18 LAB — LIPID PANEL
CHOLESTEROL TOTAL: 159 mg/dL (ref 100–199)
Chol/HDL Ratio: 3.2 ratio units (ref 0.0–4.4)
HDL: 49 mg/dL (ref >39)
LDL Calculated: 49 mg/dL (ref 0–99)
Triglycerides: 305 mg/dL — ABNORMAL HIGH (ref 0–149)
VLDL CHOLESTEROL CAL: 61 mg/dL — AB (ref 5–40)

## 2016-08-20 LAB — COMPREHENSIVE METABOLIC PANEL
ALK PHOS: 97 IU/L (ref 39–117)
ALT: 17 IU/L (ref 0–32)
AST: 12 IU/L (ref 0–40)
Albumin/Globulin Ratio: 1.5 (ref 1.2–2.2)
Albumin: 4.1 g/dL (ref 3.5–5.5)
BUN/Creatinine Ratio: 20 (ref 9–23)
BUN: 13 mg/dL (ref 6–24)
CHLORIDE: 98 mmol/L (ref 96–106)
CO2: 27 mmol/L (ref 18–29)
Calcium: 10.5 mg/dL — ABNORMAL HIGH (ref 8.7–10.2)
Creatinine, Ser: 0.66 mg/dL (ref 0.57–1.00)
GFR calc Af Amer: 126 mL/min/{1.73_m2} (ref >59)
GFR calc non Af Amer: 109 mL/min/{1.73_m2} (ref >59)
GLUCOSE: 87 mg/dL (ref 65–99)
Globulin, Total: 2.8 g/dL (ref 1.5–4.5)
POTASSIUM: 4.4 mmol/L (ref 3.5–5.2)
Sodium: 141 mmol/L (ref 134–144)
Total Protein: 6.9 g/dL (ref 6.0–8.5)

## 2016-08-20 LAB — TSH: TSH: 3.01 u[IU]/mL (ref 0.450–4.500)

## 2016-08-20 LAB — VITAMIN D 25 HYDROXY (VIT D DEFICIENCY, FRACTURES): VIT D 25 HYDROXY: 40.5 ng/mL (ref 30.0–100.0)

## 2016-08-24 ENCOUNTER — Ambulatory Visit (INDEPENDENT_AMBULATORY_CARE_PROVIDER_SITE_OTHER): Payer: Managed Care, Other (non HMO) | Admitting: Diagnostic Neuroimaging

## 2016-08-24 ENCOUNTER — Encounter: Payer: Self-pay | Admitting: Diagnostic Neuroimaging

## 2016-08-24 VITALS — BP 136/89 | HR 80 | Wt 228.0 lb

## 2016-08-24 DIAGNOSIS — G44209 Tension-type headache, unspecified, not intractable: Secondary | ICD-10-CM

## 2016-08-24 DIAGNOSIS — G43109 Migraine with aura, not intractable, without status migrainosus: Secondary | ICD-10-CM

## 2016-08-24 DIAGNOSIS — G43809 Other migraine, not intractable, without status migrainosus: Secondary | ICD-10-CM | POA: Diagnosis not present

## 2016-08-24 MED ORDER — PROPRANOLOL HCL ER 80 MG PO CP24: 80.0000 mg | ORAL_CAPSULE | Freq: Every day | ORAL | 12 refills | Status: DC

## 2016-08-24 MED ORDER — RIZATRIPTAN BENZOATE 10 MG PO TBDP: 10.0000 mg | ORAL_TABLET | ORAL | 11 refills | Status: DC | PRN

## 2016-08-24 NOTE — Progress Notes (Signed)
[facility]  PATIENT: Erin Warren DOB: November 07, 1975  REFERRING CLINICIAN:  HISTORY FROM: patient and spouse REASON FOR VISIT: follow up   HISTORICAL  CHIEF COMPLAINT:  Chief Complaint  Patient presents with  . Migraine    rm 7, sig other- [place], "going better, migraine less freq; have taken maxalt a few times-helpful, don't like side effects- nausea, sleepiness"  . Follow-up    6 month    HISTORY OF PRESENT ILLNESS:   UPDATE 08/24/16 (VRP): Since last visit doing well. 58 attack days over 6 months; improved since last visit esp after increasing propranolol. Rizatriptan helps.   UPDATE 02/09/16 (VRP): Since last visit, now avg 15-20 days per month of mild HA (since Nov / Dec 2016), esp with weather changes. Severe migraine HA are only 1 per month or 1 every 2-3 months. Now having some diff with work responsibilities due to headaches, sometimes missing work or leaving early. May need FMLA paperwork (1-4 days per month, intermittent leave).  UPDATE 02/06/15 (VRP): Doing well. Avg 1-4 days /month of HA; worse with overcast weather. Tolerating propranolol. Using OTC aleve and tylenol for breakthrough HA.   UPDATE 01/22/14 (LL): She could not tolerate SE of Topamax and was changed to Propranolol with benefit. Headaches are reduced to 2-3 per week instead of almost every day. Headaches have not been as incapacitating; she is able to function better now. 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 This is likely an incidental finding.   PRIOR HPI (10/17/13, VRP): 40 year old right-handed female with history polycystic ovarian disease, teratoma, asthma, hypertension, here for evaluation of headaches. Patient has had intermittent headaches since age 46 years old. Typically they are pressure, frontal and bitemporal headaches, one per month lasting one hour at a time. Sometimes she has nausea, fatigue,  sees sparkles of light with the bad headaches. Vision tends to be photosensitive generally speaking. No phonophobia. Sometimes she has shoulder and neck pain, sometimes sinus congestion with these headaches. Over past 6-7 months patient has had change in her headaches. Now they are more severe and more frequent. Now she's having headaches every 2-3 days, lasting hours or one day at a time. She's been using naproxen 2-3 times per week to help with headaches. Triggering factors include menstrual cycle, change in weather, stress. Family history positive for migraine in patient's maternal aunt. Patient's mother has similar headaches as well but not officially diagnosed with migraine.    REVIEW OF SYSTEMS: Full 14 system review of systems performed and negative except for headache food all env all.   ALLERGIES: Allergies  Allergen Reactions  . Topamax [Topiramate] Other (See Comments)    Flushing, paresthesias (tingling, burning).      HOME MEDICATIONS: Outpatient Medications Prior to Visit  Medication Sig Dispense Refill  . albuterol (PROVENTIL HFA;VENTOLIN HFA) 108 (90 BASE) MCG/ACT inhaler Inhale 2 puffs into the lungs every 6 (six) hours as needed. 1 Inhaler 3  . beclomethasone (QVAR) 80 MCG/ACT inhaler Inhale 1 puff into the lungs daily as needed. 3 Inhaler 3  . Cholecalciferol (VITAMIN D) 2000 UNITS tablet Take 2,000 Units by mouth daily.    . fluticasone (FLONASE) 50 MCG/ACT nasal spray Place 2 sprays into both nostrils daily. 48 g 3  . levothyroxine (SYNTHROID, LEVOTHROID) 25 MCG tablet TAKE 1 TABLET (25 MCG TOTAL) BY MOUTH DAILY. 90 tablet 1  . metFORMIN (GLUCOPHAGE) 500 MG tablet Take 1 tablet (500 mg total) by  mouth 2 (two) times daily. 180 tablet 3  . propranolol ER (INDERAL LA) 80 MG 24 hr capsule Take 1 capsule (80 mg total) by mouth daily. 30 capsule 12  . ranitidine (ZANTAC) 150 MG tablet Take 1 tablet (150 mg total) by mouth 2 (two) times daily. 60 tablet 5  . rizatriptan  (MAXALT-MLT) 10 MG disintegrating tablet Take 1 tablet (10 mg total) by mouth as needed for migraine. May repeat in 2 hours if needed 9 tablet 11   No facility-administered medications prior to visit.     PAST MEDICAL HISTORY: Past Medical History:  Diagnosis Date  . Allergy   . Asthma   . Bunion   . Heel spur    Both heels, one has fractured off  . Hypertension   . Polycystic ovarian syndrome   . Thyroid disease     PAST SURGICAL HISTORY: Past Surgical History:  Procedure Laterality Date  . PLANTAR FASCIECTOMY Bilateral 08/2014  . TERATOMA EXCISION Left   . TUBAL LIGATION  2008    FAMILY HISTORY: Family History  Problem Relation Age of Onset  . Thyroid disease Mother   . Allergies Mother   . Hypertension Father   . Heart disease Father   . Cancer Father     skin  . Alcohol abuse Maternal Grandfather   . Hypertension Paternal Grandmother   . Hypertension Paternal Grandfather   . Diabetes Paternal Grandfather   . Hypertension Brother   . Heart Problems Brother     SOCIAL HISTORY:  Social History   Social History  . Marital status: Significant Other    Spouse name: Charlett Lango  . Number of children: 0  . Years of education: College gr   Occupational History  . 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 Smoking status: Never Smoker  . Smokeless tobacco: Never Used  . Alcohol use 0.6 oz/week    1 Glasses of wine per week     Comment: 1-4/month  . Drug use: No  . Sexual activity: Yes    Partners: Male    Birth control/ protection: Surgical   Other Topics Concern  . Not on file   Social History Narrative   Patient lives at home with domestic partner.   Caffeine Use: 1-2 cups of coffee a day     PHYSICAL EXAM  Vitals:   08/24/16 1133  BP: 136/89  Pulse: 80  Weight: 228 lb (103.4 kg)   Body mass index is 37.08 kg/m.  Wt Readings from Last 3 Encounters:  08/24/16 228 lb (103.4 kg)    08/17/16 229 lb 6.4 oz (104.1 kg)  02/10/16 220 lb 12.8 oz (100.2 kg)   No exam data present  No flowsheet data found.  GENERAL EXAM: Patient is in no distress; well developed, nourished and groomed; neck is supple  CARDIOVASCULAR: Regular rate and rhythm, no murmurs, no carotid bruits  NEUROLOGIC: MENTAL STATUS: awake, alert, language fluent, comprehension intact, naming intact, fund of knowledge appropriate CRANIAL NERVE: no papilledema on fundoscopic exam, pupils equal and reactive to light, visual fields full to confrontation, extraocular muscles intact, no nystagmus, facial sensation and strength symmetric, hearing intact, palate elevates symmetrically, uvula midline, shoulder shrug symmetric, tongue midline. MOTOR: normal bulk and tone, full strength in the BUE, BLE SENSORY: normal and symmetric to light touch, temperature, vibration  COORDINATION: finger-nose-finger, fine finger movements normal REFLEXES: deep tendon reflexes present and symmetric GAIT/STATION: narrow based  gait; able to walk tandem; romberg is negative    DIAGNOSTIC DATA (LABS, IMAGING, TESTING) - I reviewed patient records, labs, notes, testing and imaging myself where available.  Lab Results  Component Value Date   WBC 9.0 02/04/2015   HGB 13.9 02/04/2015   HCT 40.2 02/04/2015   MCV 90.5 02/04/2015   PLT 400 02/04/2015      Component Value Date/Time   NA 141 08/17/2016 1650   K 4.4 08/17/2016 1650   CL 98 08/17/2016 1650   CO2 27 08/17/2016 1650   GLUCOSE 87 08/17/2016 1650   GLUCOSE 80 02/10/2016 1735   BUN 13 08/17/2016 1650   CREATININE 0.66 08/17/2016 1650   CREATININE 0.67 02/10/2016 1735   CALCIUM 10.5 (H) 08/17/2016 1650   PROT 6.9 08/17/2016 1650   ALBUMIN 4.1 08/17/2016 1650   AST 12 08/17/2016 1650   ALT 17 08/17/2016 1650   ALKPHOS 97 08/17/2016 1650   BILITOT <0.2 08/17/2016 1650   GFRNONAA 109 08/17/2016 1650   GFRAA 126 08/17/2016 1650   Lab Results  Component Value  Date   CHOL 159 08/17/2016   HDL 49 08/17/2016   LDLCALC 49 08/17/2016   TRIG 305 (H) 08/17/2016   CHOLHDL 3.2 08/17/2016   Lab Results  Component Value Date   HGBA1C 4.7 09/27/2013   No results found for: VITAMINB12 Lab Results  Component Value Date   TSH 3.010 08/17/2016    2/10/11/13 MRI brain (with and without) demonstrating: 1. There is T1 and T2 hyperintense lesion (measuring 1.2x1.3x1.0cm) in the right posterior sphenoid sinus region. This abuts the adjacent pituitary gland. On sagittal views, this may be contiguous with the normal pituitary tissue, but on axial views if appears distinct. No abnormal enhancement. Considerations include sphenoid sinus inflammatory disease or mucocele. Less likely represents an intrasellar/pituitary mass with intrasphenoidal extension. 2. Remainder of brain parenchyma is unremarkable.  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 Considerations would include atypical hemangioma, or focal fibrous dysplasia. No evidence for expansile lesion encroaching on the surrounding neural or vascular structures. The lesion does not appear to represent a aggressive neoplasm such as chordoma.     ASSESSMENT AND PLAN  40 y.o. year old female here with history of headaches since age 47 years old, with worsening severity and frequency of headaches since 2015. Headaches have some migraine features (nausea, long duration, photosensitivity, seeing sparkling lights). Some worsening since Nov/Dec 2016.   Dx:  Migraine with aura and without status migrainosus, not intractable  Tension headache    PLAN: - continue propranolol ER to 80mg  daily - continue rizatriptan as needed for migraine rescue - continue OTC aleve and tylenol as needed - Sometimes missing work or leaving early. May need FMLA paperwork (avg 1-4 days per month, intermittent leave).  Meds ordered this encounter  Medications  . propranolol  ER (INDERAL LA) 80 MG 24 hr capsule    Sig: Take 1 capsule (80 mg total) by mouth daily.    Dispense:  30 capsule    Refill:  12  . rizatriptan (MAXALT-MLT) 10 MG disintegrating tablet    Sig: Take 1 tablet (10 mg total) by mouth as needed for migraine. May repeat in 2 hours if needed    Dispense:  9 tablet    Refill:  11   Return in about 13 months (around 09/24/2017).    Penni Bombard, MD [id number], [id number] PM Certified in Neurology, Neurophysiology and Neuroimaging  [facility]  [place], [address] [place], [place] [address] [phone]

## 2016-09-13 ENCOUNTER — Other Ambulatory Visit: Payer: Self-pay | Admitting: Physician Assistant

## 2016-09-13 DIAGNOSIS — K219 Gastro-esophageal reflux disease without esophagitis: Secondary | ICD-10-CM

## 2016-12-11 ENCOUNTER — Other Ambulatory Visit: Payer: Self-pay

## 2016-12-11 MED ORDER — BECLOMETHASONE DIPROP HFA 80 MCG/ACT IN AERB: 1.0000 | INHALATION_SPRAY | Freq: Every day | RESPIRATORY_TRACT | 1 refills | Status: DC

## 2016-12-15 ENCOUNTER — Other Ambulatory Visit: Payer: Self-pay | Admitting: Diagnostic Neuroimaging

## 2016-12-15 ENCOUNTER — Telehealth: Payer: Self-pay | Admitting: Physician Assistant

## 2016-12-15 DIAGNOSIS — G43809 Other migraine, not intractable, without status migrainosus: Secondary | ICD-10-CM

## 2016-12-15 NOTE — Telephone Encounter (Signed)
Is there a sub for qvar?

## 2016-12-15 NOTE — Telephone Encounter (Signed)
[facility] is calling to state that the inhaler that was called in is not covered by insurance and is needing a different inhaler called in   Best number for [facility] [phone]

## 2016-12-16 MED ORDER — FLUTICASONE PROPIONATE HFA 44 MCG/ACT IN AERO: 2.0000 | INHALATION_SPRAY | Freq: Two times a day (BID) | RESPIRATORY_TRACT | 1 refills | Status: DC

## 2016-12-16 NOTE — Telephone Encounter (Signed)
flovent covered and sent to pharmacy

## 2016-12-16 NOTE — Telephone Encounter (Signed)
Flovent? AeroBid? Asmanex? Pulmicort?

## 2016-12-23 ENCOUNTER — Other Ambulatory Visit: Payer: Self-pay | Admitting: *Deleted

## 2016-12-23 DIAGNOSIS — G43809 Other migraine, not intractable, without status migrainosus: Secondary | ICD-10-CM

## 2016-12-23 MED ORDER — PROPRANOLOL HCL ER 80 MG PO CP24: 80.0000 mg | ORAL_CAPSULE | Freq: Every day | ORAL | 12 refills | Status: DC

## 2017-02-14 ENCOUNTER — Other Ambulatory Visit: Payer: Self-pay | Admitting: Physician Assistant

## 2017-03-08 ENCOUNTER — Other Ambulatory Visit: Payer: Self-pay | Admitting: Physician Assistant

## 2017-03-08 DIAGNOSIS — K219 Gastro-esophageal reflux disease without esophagitis: Secondary | ICD-10-CM

## 2017-03-08 DIAGNOSIS — E282 Polycystic ovarian syndrome: Secondary | ICD-10-CM

## 2017-07-12 ENCOUNTER — Other Ambulatory Visit: Payer: Self-pay | Admitting: Diagnostic Neuroimaging

## 2017-09-08 ENCOUNTER — Ambulatory Visit: Payer: Managed Care, Other (non HMO) | Admitting: Physician Assistant

## 2017-09-08 VITALS — BP 140/92 | HR 93 | Temp 98.8°F | Resp 16 | Ht 65.0 in | Wt 239.0 lb

## 2017-09-08 DIAGNOSIS — J22 Unspecified acute lower respiratory infection: Secondary | ICD-10-CM | POA: Diagnosis not present

## 2017-09-08 MED ORDER — AZITHROMYCIN 250 MG PO TABS: ORAL_TABLET | ORAL | 0 refills | Status: DC

## 2017-09-08 MED ORDER — PREDNISONE 20 MG PO TABS: ORAL_TABLET | ORAL | 0 refills | Status: DC

## 2017-09-08 NOTE — Progress Notes (Signed)
PRIMARY CARE AT [facility] [address] [place] [address] [phone]  Date:  09/08/2017   Name:  Erin Warren   DOB:  Aug 22, 1976   MRN:  [id number]  PCP:  Harrison Mons, PA-C    History of Present Illness:  Erin Warren is a 42 y.o. female patient who presents to PCP with  Chief Complaint  Patient presents with  . Cough    chronic cough/ x 6 months. productive green mucus  . Fatigue    x 1wk     Cough persistent for the last 3 months, worsened over the last week.  She has fatigue.  Productive cough more in the morning, and more with talking a lot. She has runny-nose and nasal congestion.  She has headaches with the coughing.  Sore throat but has resolved.  No sob or dyspnea.  She is currently taking symbicort with her allergist for the last 2 months.  She has taken mucinex, and mucinex-D.    Patient Active Problem List   Diagnosis Date Noted  . Elbow pain, left 08/17/2016  . Migraine with aura and without status migrainosus, not intractable 02/09/2016  . 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 GERD (gastroesophageal reflux disease) 03/18/2015  . Vitamin D deficiency 02/04/2015  . Seasonal allergies 02/04/2015  . Multiple food allergies 02/04/2015  . Bunion   . Migraine with aura 01/22/2014  . Hypothyroidism 09/27/2013  . Mild persistent asthma in adult without complication 17/61/6073  . 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 POLYCYSTIC OVARIAN DISEASE 12/23/2006    Past Medical History:  Diagnosis Date  . Allergy   . Asthma   . Bunion   . Heel spur    Both heels, one has fractured off  . Hypertension   . Polycystic ovarian syndrome   . Thyroid disease     Past Surgical History:  Procedure Laterality Date  . PLANTAR FASCIECTOMY Bilateral 08/2014  . TERATOMA EXCISION Left   . TUBAL LIGATION  2008    Social History   Tobacco Use  . Smoking status: Never Smoker  . Smokeless tobacco: Never Used  Substance Use Topics  . Alcohol use: Yes    Alcohol/week: 0.6 oz   Types: 1 Glasses of wine per week    Comment: 1-4/month  . Drug use: No    Family History  Problem Relation Age of Onset  . Thyroid disease Mother   . Allergies Mother   . Hypertension Father   . Heart disease Father   . Cancer Father        skin  . Alcohol abuse Maternal Grandfather   . Hypertension Paternal Grandmother   . Hypertension Paternal Grandfather   . Diabetes Paternal Grandfather   . Hypertension Brother   . Heart Problems Brother     Allergies  Allergen Reactions  . Topamax [Topiramate] Other (See Comments)    Flushing, paresthesias (tingling, burning).      Medication list has been reviewed and updated.  Current Outpatient Medications on File Prior to Visit  Medication Sig Dispense Refill  . albuterol (PROVENTIL HFA;VENTOLIN HFA) 108 (90 BASE) MCG/ACT inhaler Inhale 2 puffs into the lungs every 6 (six) hours as needed. 1 Inhaler 3  . Beclomethasone Diprop HFA (QVAR REDIHALER) 80 MCG/ACT AERB Inhale 1 puff into the lungs daily. 1 Inhaler 1  . Cholecalciferol (VITAMIN D) 2000 UNITS tablet Take 2,000 Units by mouth daily.    [name] EPIPEN 2-PAK 0.3 MG/0.3ML SOAJ injection as needed.    . fluticasone (FLONASE) 50  MCG/ACT nasal spray PLACE 2 SPRAYS INTO BOTH NOSTRILS DAILY. 48 g 2  . fluticasone (FLOVENT HFA) 44 MCG/ACT inhaler Inhale 2 puffs into the lungs 2 (two) times daily. 31.2 Inhaler 3  . levothyroxine (SYNTHROID, LEVOTHROID) 25 MCG tablet TAKE 1 TABLET BY MOUTH EVERY DAY 90 tablet 1  . metFORMIN (GLUCOPHAGE) 500 MG tablet TAKE 1 TABLET BY MOUTH TWICE A DAY 180 tablet 3  . propranolol ER (INDERAL LA) 80 MG 24 hr capsule Take 1 capsule (80 mg total) by mouth daily. 30 capsule 12  . ranitidine (ZANTAC) 150 MG tablet TAKE 1 TABLET BY MOUTH TWICE A DAY 180 tablet 3  . rizatriptan (MAXALT-MLT) 10 MG disintegrating tablet TAKE 1 TABLET (10 MG TOTAL) BY MOUTH AS NEEDED FOR MIGRAINE. MAY REPEAT IN 2 HOURS IF NEEDED 9 tablet 4   No current facility-administered  medications on file prior to visit.     ROS ROS otherwise unremarkable unless listed above.  Physical Examination: BP (!) 148/100   Pulse 93   Temp 98.8 F (37.1 C) (Oral)   Resp 16   Ht 5\' 5"  (1.651 m)   Wt 239 lb (108.4 kg)   LMP 08/25/2017   SpO2 96%   BMI 39.77 kg/m  Ideal Body Weight: Weight in (lb) to have BMI = 25: 149.9  Physical Exam  Constitutional: She is oriented to person, place, and time. She appears well-developed and well-nourished. No distress.  HENT:  Head: Normocephalic and atraumatic.  Right Ear: Tympanic membrane, external ear and ear canal normal.  Left Ear: Tympanic membrane, external ear and ear canal normal.  Nose: Mucosal edema and rhinorrhea present. Right sinus exhibits no maxillary sinus tenderness and no frontal sinus tenderness. Left sinus exhibits no maxillary sinus tenderness and no frontal sinus tenderness.  Mouth/Throat: No uvula swelling. No oropharyngeal exudate, posterior oropharyngeal edema or posterior oropharyngeal erythema.  Eyes: Conjunctivae and EOM are normal. Pupils are equal, round, and reactive to light.  Cardiovascular: Normal rate and regular rhythm. Exam reveals no gallop, no distant heart sounds and no friction rub.  No murmur heard. Pulmonary/Chest: Effort normal. No respiratory distress. She has no decreased breath sounds. She has wheezes (mild wheezing). She has no rhonchi.  Lymphadenopathy:       Head (right side): No submandibular, no tonsillar, no preauricular and no posterior auricular adenopathy present.       Head (left side): No submandibular, no tonsillar, no preauricular and no posterior auricular adenopathy present.  Neurological: She is alert and oriented to person, place, and time.  Skin: She is not diaphoretic.  Psychiatric: She has a normal mood and affect. Her behavior is normal.     Assessment and Plan: Erin Warren is a 42 y.o. female who is here today for cc of  Chief Complaint  Patient presents with   . Cough    chronic cough/ x 6 months. productive green mucus  . Fatigue    x 1wk   Lower respiratory infection (e.g., bronchitis, pneumonia, pneumonitis, pulmonitis) - Plan: azithromycin (ZITHROMAX) 250 MG tablet, predniSONE (DELTASONE) 20 MG tablet  Ivar Drape, PA-C [facility] 1/15/20198:45 AM

## 2017-09-08 NOTE — Patient Instructions (Addendum)
Please hydrate well with at least 64 oz of water per day. You can continue the mucinex.   Acute Bronchitis, Adult Acute bronchitis is when air tubes (bronchi) in the lungs suddenly get swollen. The condition can make it hard to breathe. It can also cause these symptoms:  A cough.  Coughing up clear, yellow, or green mucus.  Wheezing.  Chest congestion.  Shortness of breath.  A fever.  Body aches.  Chills.  A sore throat.  Follow these instructions at home: Medicines  Take over-the-counter and prescription medicines only as told by your doctor.  If you were prescribed an antibiotic medicine, take it as told by your doctor. Do not stop taking the antibiotic even if you start to feel better. General instructions  Rest.  Drink enough fluids to keep your pee (urine) clear or pale yellow.  Avoid smoking and secondhand smoke. If you smoke and you need help quitting, ask your doctor. Quitting will help your lungs heal faster.  Use an inhaler, cool mist vaporizer, or humidifier as told by your doctor.  Keep all follow-up visits as told by your doctor. This is important. How is this prevented? To lower your risk of getting this condition again:  Wash your hands often with soap and water. If you cannot use soap and water, use hand sanitizer.  Avoid contact with people who have cold symptoms.  Try not to touch your hands to your mouth, nose, or eyes.  Make sure to get the flu shot every year.  Contact a doctor if:  Your symptoms do not get better in 2 weeks. Get help right away if:  You cough up blood.  You have chest pain.  You have very bad shortness of breath.  You become dehydrated.  You faint (pass out) or keep feeling like you are going to pass out.  You keep throwing up (vomiting).  You have a very bad headache.  Your fever or chills gets worse. This information is not intended to replace advice given to you by your health care provider. Make sure  you discuss any questions you have with your health care provider. Document Released: 02/09/2008 Document Revised: 03/31/2016 Document Reviewed: 02/11/2016 Elsevier Interactive Patient Education  2018 [employer].     IF you received an x-ray today, you will receive an invoice from [facility]. Please contact [facility] at [phone] with questions or concerns regarding your invoice.   IF you received labwork today, you will receive an invoice from [facility]. Please contact [facility] at [phone] with questions or concerns regarding your invoice.   Our billing staff will not be able to assist you with questions regarding bills from these companies.  You will be contacted with the lab results as soon as they are available. The fastest way to get your results is to activate your My Chart account. Instructions are located on the last page of this paperwork. If you have not heard from [name] regarding the results in 2 weeks, please contact this office.

## 2017-09-20 ENCOUNTER — Encounter: Payer: Self-pay | Admitting: Physician Assistant

## 2017-09-26 ENCOUNTER — Ambulatory Visit: Payer: 59 | Admitting: Diagnostic Neuroimaging

## 2017-09-26 ENCOUNTER — Other Ambulatory Visit: Payer: Self-pay | Admitting: Physician Assistant

## 2017-09-26 ENCOUNTER — Encounter: Payer: Self-pay | Admitting: Diagnostic Neuroimaging

## 2017-09-26 VITALS — BP 147/89 | HR 80 | Ht 65.0 in | Wt 240.2 lb

## 2017-09-26 DIAGNOSIS — G43809 Other migraine, not intractable, without status migrainosus: Secondary | ICD-10-CM

## 2017-09-26 DIAGNOSIS — G43109 Migraine with aura, not intractable, without status migrainosus: Secondary | ICD-10-CM | POA: Diagnosis not present

## 2017-09-26 MED ORDER — PROPRANOLOL HCL ER 80 MG PO CP24: 80.0000 mg | ORAL_CAPSULE | Freq: Every day | ORAL | 4 refills | Status: DC

## 2017-09-26 MED ORDER — RIZATRIPTAN BENZOATE 10 MG PO TBDP: 10.0000 mg | ORAL_TABLET | ORAL | 12 refills | Status: DC | PRN

## 2017-09-26 NOTE — Patient Instructions (Signed)
-  continue current medications

## 2017-09-26 NOTE — Progress Notes (Signed)
[facility]  PATIENT: Erin Warren DOB: September 16, 1975  REFERRING CLINICIAN:  HISTORY FROM: patient and spouse REASON FOR VISIT: follow up   HISTORICAL  CHIEF COMPLAINT:  Chief Complaint  Patient presents with  . Follow-up  . Migraine    doing better, had one last week, Weather change.  (she feels likethey are under control     HISTORY OF PRESENT ILLNESS:   UPDATE (09/26/17, VRP): Since last visit, doing well. Tolerating meds. No alleviating or aggravating factors. Avg 1-2 days HA per month. Rizatriptan helps.   UPDATE 08/24/16 (VRP): Since last visit doing well. 44 attack days over 6 months; improved since last visit esp after increasing propranolol. Rizatriptan helps.   UPDATE 02/09/16 (VRP): Since last visit, now avg 15-20 days per month of mild HA (since Nov / Dec 2016), esp with weather changes. Severe migraine HA are only 1 per month or 1 every 2-3 months. Now having some diff with work responsibilities due to headaches, sometimes missing work or leaving early. May need FMLA paperwork (1-4 days per month, intermittent leave).  UPDATE 02/06/15 (VRP): Doing well. Avg 1-4 days /month of HA; worse with overcast weather. Tolerating propranolol. Using OTC aleve and tylenol for breakthrough HA.   UPDATE 01/22/14 (LL): She could not tolerate SE of Topamax and was changed to Propranolol with benefit. Headaches are reduced to 2-3 per week instead of almost every day. Headaches have not been as incapacitating; she is able to function better now. 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 This is likely an incidental finding.   PRIOR HPI (10/17/13, VRP): 42 year old right-handed female with history polycystic ovarian disease, teratoma, asthma, hypertension, here for evaluation of headaches. Patient has had intermittent headaches since age 13 years old. Typically they are pressure, frontal and bitemporal  headaches, one per month lasting one hour at a time. Sometimes she has nausea, fatigue, sees sparkles of light with the bad headaches. Vision tends to be photosensitive generally speaking. No phonophobia. Sometimes she has shoulder and neck pain, sometimes sinus congestion with these headaches. Over past 6-7 months patient has had change in her headaches. Now they are more severe and more frequent. Now she's having headaches every 2-3 days, lasting hours or one day at a time. She's been using naproxen 2-3 times per week to help with headaches. Triggering factors include menstrual cycle, change in weather, stress. Family history positive for migraine in patient's maternal aunt. Patient's mother has similar headaches as well but not officially diagnosed with migraine.    REVIEW OF SYSTEMS: Full 14 system review of systems performed and negative except: headache fatigue cough.   ALLERGIES: Allergies  Allergen Reactions  . Topamax [Topiramate] Other (See Comments)    Flushing, paresthesias (tingling, burning).      HOME MEDICATIONS: Outpatient Medications Prior to Visit  Medication Sig Dispense Refill  . albuterol (PROVENTIL HFA;VENTOLIN HFA) 108 (90 BASE) MCG/ACT inhaler Inhale 2 puffs into the lungs every 6 (six) hours as needed. 1 Inhaler 3  . Cholecalciferol (VITAMIN D) 2000 UNITS tablet Take 2,000 Units by mouth daily.    [name] EPIPEN 2-PAK 0.3 MG/0.3ML SOAJ injection as needed.    . fluticasone (FLONASE) 50 MCG/ACT nasal spray PLACE 2 SPRAYS INTO BOTH NOSTRILS DAILY. 48 g 2  . fluticasone (FLOVENT HFA) 44 MCG/ACT inhaler Inhale 2 puffs into the lungs 2 (two) times daily. 31.2 Inhaler 3  . levothyroxine (SYNTHROID, LEVOTHROID) 25 MCG tablet Take  1 tablet (25 mcg total) by mouth daily. OFFICE VISIT NEEDED 30 tablet 0  . metFORMIN (GLUCOPHAGE) 500 MG tablet TAKE 1 TABLET BY MOUTH TWICE A DAY 180 tablet 3  . mometasone (ASMANEX) 220 MCG/INH inhaler Inhale 2 puffs into the lungs daily as needed.      . propranolol ER (INDERAL LA) 80 MG 24 hr capsule Take 1 capsule (80 mg total) by mouth daily. 30 capsule 12  . ranitidine (ZANTAC) 150 MG tablet TAKE 1 TABLET BY MOUTH TWICE A DAY 180 tablet 3  . rizatriptan (MAXALT-MLT) 10 MG disintegrating tablet TAKE 1 TABLET (10 MG TOTAL) BY MOUTH AS NEEDED FOR MIGRAINE. MAY REPEAT IN 2 HOURS IF NEEDED 9 tablet 4  . azithromycin (ZITHROMAX) 250 MG tablet Take 2 tabs PO x 1 dose, then 1 tab PO QD x 4 days 6 tablet 0  . Beclomethasone Diprop HFA (QVAR REDIHALER) 80 MCG/ACT AERB Inhale 1 puff into the lungs daily. 1 Inhaler 1  . predniSONE (DELTASONE) 20 MG tablet Take 3 PO QAM x2days, 2 PO QAM x2days, 1 PO QAM x2days 12 tablet 0   No facility-administered medications prior to visit.     PAST MEDICAL HISTORY: Past Medical History:  Diagnosis Date  . Allergy   . Asthma   . Bunion   . Heel spur    Both heels, one has fractured off  . Hypertension   . Polycystic ovarian syndrome   . Thyroid disease     PAST SURGICAL HISTORY: Past Surgical History:  Procedure Laterality Date  . PLANTAR FASCIECTOMY Bilateral 08/2014  . TERATOMA EXCISION Left   . TUBAL LIGATION  2008    FAMILY HISTORY: Family History  Problem Relation Age of Onset  . Thyroid disease Mother   . Allergies Mother   . Hypertension Father   . Heart disease Father   . Cancer Father        skin  . Alcohol abuse Maternal Grandfather   . Hypertension Paternal Grandmother   . Hypertension Paternal Grandfather   . Diabetes Paternal Grandfather   . Hypertension Brother   . Heart Problems Brother     SOCIAL HISTORY:  Social History   Socioeconomic History  . Marital status: Significant Other    Spouse name: Charlett Lango  . Number of children: 0  . Years of education: College gr  . Highest education level: Not on file  Social Needs  . Financial resource strain: Not on file  . Food insecurity - worry: Not on file  . Food insecurity - inability: Not on file  .  Transportation needs - medical: Not on file  . Transportation needs - non-medical: Not on file  Occupational History  . 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 Smoking status: Never Smoker  . Smokeless tobacco: Never Used  Substance and Sexual Activity  . Alcohol use: Yes    Alcohol/week: 0.6 oz    Types: 1 Glasses of wine per week    Comment: 1-4/month  . Drug use: No  . Sexual activity: Yes    Partners: Male    Birth control/protection: Surgical  Other Topics Concern  . Not on file  Social History Narrative   Patient lives at home with domestic partner.   Caffeine Use: 1-2 cups of coffee a day     PHYSICAL EXAM  Vitals:   09/26/17 1526  BP: (!) 147/89  Pulse: 80  Weight: 240 lb 3.2 oz (109 kg)  Height: 5\' 5"  (1.651 m)   Body mass index is 39.97 kg/m.  Wt Readings from Last 3 Encounters:  09/26/17 240 lb 3.2 oz (109 kg)  09/08/17 239 lb (108.4 kg)  08/24/16 228 lb (103.4 kg)   No exam data present  No flowsheet data found.  GENERAL EXAM: Patient is in no distress; well developed, nourished and groomed; neck is supple  CARDIOVASCULAR: Regular rate and rhythm, no murmurs, no carotid bruits  NEUROLOGIC: MENTAL STATUS: awake, alert, language fluent, comprehension intact, naming intact, fund of knowledge appropriate CRANIAL NERVE: no papilledema on fundoscopic exam, pupils equal and reactive to light, visual fields full to confrontation, extraocular muscles intact, no nystagmus, facial sensation and strength symmetric, hearing intact, palate elevates symmetrically, uvula midline, shoulder shrug symmetric, tongue midline. MOTOR: normal bulk and tone, full strength in the BUE, BLE SENSORY: normal and symmetric to light touch, temperature, vibration  COORDINATION: finger-nose-finger, fine finger movements normal REFLEXES: deep tendon reflexes present and symmetric GAIT/STATION: narrow based  gait     DIAGNOSTIC DATA (LABS, IMAGING, TESTING) - I reviewed patient records, labs, notes, testing and imaging myself where available.  Lab Results  Component Value Date   WBC 9.0 02/04/2015   HGB 13.9 02/04/2015   HCT 40.2 02/04/2015   MCV 90.5 02/04/2015   PLT 400 02/04/2015      Component Value Date/Time   NA 141 08/17/2016 1650   K 4.4 08/17/2016 1650   CL 98 08/17/2016 1650   CO2 27 08/17/2016 1650   GLUCOSE 87 08/17/2016 1650   GLUCOSE 80 02/10/2016 1735   BUN 13 08/17/2016 1650   CREATININE 0.66 08/17/2016 1650   CREATININE 0.67 02/10/2016 1735   CALCIUM 10.5 (H) 08/17/2016 1650   PROT 6.9 08/17/2016 1650   ALBUMIN 4.1 08/17/2016 1650   AST 12 08/17/2016 1650   ALT 17 08/17/2016 1650   ALKPHOS 97 08/17/2016 1650   BILITOT <0.2 08/17/2016 1650   GFRNONAA 109 08/17/2016 1650   GFRAA 126 08/17/2016 1650   Lab Results  Component Value Date   CHOL 159 08/17/2016   HDL 49 08/17/2016   LDLCALC 49 08/17/2016   TRIG 305 (H) 08/17/2016   CHOLHDL 3.2 08/17/2016   Lab Results  Component Value Date   HGBA1C 4.7 09/27/2013   No results found for: VITAMINB12 Lab Results  Component Value Date   TSH 3.010 08/17/2016    2/10/11/13 MRI brain (with and without) demonstrating: 1. There is T1 and T2 hyperintense lesion (measuring 1.2x1.3x1.0cm) in the right posterior sphenoid sinus region. This abuts the adjacent pituitary gland. On sagittal views, this may be contiguous with the normal pituitary tissue, but on axial views if appears distinct. No abnormal enhancement. Considerations include sphenoid sinus inflammatory disease or mucocele. Less likely represents an intrasellar/pituitary mass with intrasphenoidal extension. 2. Remainder of brain parenchyma is unremarkable.  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 Considerations would include atypical hemangioma, or focal fibrous dysplasia. No evidence for  expansile lesion encroaching on the surrounding neural or vascular structures. The lesion does not appear to represent a aggressive neoplasm such as chordoma.     ASSESSMENT AND PLAN  42 y.o. year old female here with history of headaches since age 48 years old, with worsening severity and frequency of headaches since 2015. Headaches have some migraine features (nausea, long duration, photosensitivity, seeing sparkling lights). Some worsening since Nov/Dec 2016.   Dx:  Migraine with aura and without status migrainosus, not intractable  Other migraine without status migrainosus, not intractable - Plan: propranolol ER (INDERAL LA) 80 MG 24 hr capsule    PLAN:  MIGRAINE WITH AURA - continue propranolol ER to 80mg  daily - continue rizatriptan as needed for migraine rescue - continue OTC aleve and tylenol as needed - Sometimes missing work or leaving early. May need FMLA paperwork (avg 1-4 days per month, intermittent leave).  HYPERTENSION - follow up with PCP  Meds ordered this encounter  Medications  . rizatriptan (MAXALT-MLT) 10 MG disintegrating tablet    Sig: Take 1 tablet (10 mg total) by mouth as needed for migraine. May repeat in 2 hours if needed    Dispense:  9 tablet    Refill:  12  . propranolol ER (INDERAL LA) 80 MG 24 hr capsule    Sig: Take 1 capsule (80 mg total) by mouth daily.    Dispense:  90 capsule    Refill:  4   Return in about 1 year (around 09/26/2018) for with NP/PA or Willma Obando.    Penni Bombard, MD 9/45/8592, 9:24 PM Certified in Neurology, Neurophysiology and Neuroimaging  [facility] [address], [place], [place] [address] [phone]

## 2017-10-18 ENCOUNTER — Ambulatory Visit (INDEPENDENT_AMBULATORY_CARE_PROVIDER_SITE_OTHER): Payer: 59 | Admitting: Physician Assistant

## 2017-10-18 ENCOUNTER — Other Ambulatory Visit: Payer: Self-pay

## 2017-10-18 ENCOUNTER — Encounter: Payer: Self-pay | Admitting: Physician Assistant

## 2017-10-18 VITALS — BP 138/88 | HR 89 | Temp 98.0°F | Resp 16 | Ht 66.14 in | Wt 241.0 lb

## 2017-10-18 DIAGNOSIS — Z130 Encounter for screening for diseases of the blood and blood-forming organs and certain disorders involving the immune mechanism: Secondary | ICD-10-CM

## 2017-10-18 DIAGNOSIS — Z0000 Encounter for general adult medical examination without abnormal findings: Secondary | ICD-10-CM

## 2017-10-18 DIAGNOSIS — Z1389 Encounter for screening for other disorder: Secondary | ICD-10-CM

## 2017-10-18 DIAGNOSIS — E282 Polycystic ovarian syndrome: Secondary | ICD-10-CM

## 2017-10-18 DIAGNOSIS — E559 Vitamin D deficiency, unspecified: Secondary | ICD-10-CM

## 2017-10-18 DIAGNOSIS — Z13228 Encounter for screening for other metabolic disorders: Secondary | ICD-10-CM | POA: Diagnosis not present

## 2017-10-18 DIAGNOSIS — Z13220 Encounter for screening for lipoid disorders: Secondary | ICD-10-CM | POA: Diagnosis not present

## 2017-10-18 DIAGNOSIS — E039 Hypothyroidism, unspecified: Secondary | ICD-10-CM

## 2017-10-18 DIAGNOSIS — Z23 Encounter for immunization: Secondary | ICD-10-CM

## 2017-10-18 MED ORDER — LEVOTHYROXINE SODIUM 25 MCG PO TABS: 25.0000 ug | ORAL_TABLET | Freq: Every day | ORAL | 3 refills | Status: AC

## 2017-10-18 NOTE — Patient Instructions (Addendum)
   IF you received an x-ray today, you will receive an invoice from [facility]. Please contact [facility] at [phone] with questions or concerns regarding your invoice.   IF you received labwork today, you will receive an invoice from [facility]. Please contact [facility] at [phone] with questions or concerns regarding your invoice.   Our billing staff will not be able to assist you with questions regarding bills from these companies.  You will be contacted with the lab results as soon as they are available. The fastest way to get your results is to activate your My Chart account. Instructions are located on the last page of this paperwork. If you have not heard from us regarding the results in 2 weeks, please contact this office.      Preventive Care 40-64 Years, Female Preventive care refers to lifestyle choices and visits with your health care provider that can promote health and wellness. What does preventive care include?  A yearly physical exam. This is also called an annual well check.  Dental exams once or twice a year.  Routine eye exams. Ask your health care provider how often you should have your eyes checked.  Personal lifestyle choices, including: ? Daily care of your teeth and gums. ? Regular physical activity. ? Eating a healthy diet. ? Avoiding tobacco and drug use. ? Limiting alcohol use. ? Practicing safe sex. ? Taking low-dose aspirin daily starting at age 50. ? Taking vitamin and mineral supplements as recommended by your health care provider. What happens during an annual well check? The services and screenings done by your health care provider during your annual well check will depend on your age, overall health, lifestyle risk factors, and family history of disease. Counseling Your health care provider may ask you questions about your:  Alcohol use.  Tobacco use.  Drug use.  Emotional well-being.  Home and relationship  well-being.  Sexual activity.  Eating habits.  Work and work environment.  Method of birth control.  Menstrual cycle.  Pregnancy history.  Screening You may have the following tests or measurements:  Height, weight, and BMI.  Blood pressure.  Lipid and cholesterol levels. These may be checked every 5 years, or more frequently if you are over 50 years old.  Skin check.  Lung cancer screening. You may have this screening every year starting at age 55 if you have a 30-pack-year history of smoking and currently smoke or have quit within the past 15 years.  Fecal occult blood test (FOBT) of the stool. You may have this test every year starting at age 50.  Flexible sigmoidoscopy or colonoscopy. You may have a sigmoidoscopy every 5 years or a colonoscopy every 10 years starting at age 50.  Hepatitis C blood test.  Hepatitis B blood test.  Sexually transmitted disease (STD) testing.  Diabetes screening. This is done by checking your blood sugar (glucose) after you have not eaten for a while (fasting). You may have this done every 1-3 years.  Mammogram. This may be done every 1-2 years. Talk to your health care provider about when you should start having regular mammograms. This may depend on whether you have a family history of breast cancer.  BRCA-related cancer screening. This may be done if you have a family history of breast, ovarian, tubal, or peritoneal cancers.  Pelvic exam and Pap test. This may be done every 3 years starting at age 21. Starting at age 30, this may be done every 5 years if   you have a Pap test in combination with an HPV test.  Bone density scan. This is done to screen for osteoporosis. You may have this scan if you are at high risk for osteoporosis.  Discuss your test results, treatment options, and if necessary, the need for more tests with your health care provider. Vaccines Your health care provider may recommend certain vaccines, such  as:  Influenza vaccine. This is recommended every year.  Tetanus, diphtheria, and acellular pertussis (Tdap, Td) vaccine. You may need a Td booster every 10 years.  Varicella vaccine. You may need this if you have not been vaccinated.  Zoster vaccine. You may need this after age 60.  Measles, mumps, and rubella (MMR) vaccine. You may need at least one dose of MMR if you were born in 1957 or later. You may also need a second dose.  Pneumococcal 13-valent conjugate (PCV13) vaccine. You may need this if you have certain conditions and were not previously vaccinated.  Pneumococcal polysaccharide (PPSV23) vaccine. You may need one or two doses if you smoke cigarettes or if you have certain conditions.  Meningococcal vaccine. You may need this if you have certain conditions.  Hepatitis A vaccine. You may need this if you have certain conditions or if you travel or work in places where you may be exposed to hepatitis A.  Hepatitis B vaccine. You may need this if you have certain conditions or if you travel or work in places where you may be exposed to hepatitis B.  Haemophilus influenzae type b (Hib) vaccine. You may need this if you have certain conditions.  Talk to your health care provider about which screenings and vaccines you need and how often you need them. This information is not intended to replace advice given to you by your health care provider. Make sure you discuss any questions you have with your health care provider. Document Released: 09/19/2015 Document Revised: 05/12/2016 Document Reviewed: 06/24/2015 Elsevier Interactive Patient Education  2018 Elsevier Inc.  

## 2017-10-18 NOTE — Progress Notes (Signed)
Patient ID: Erin Warren, female    DOB: [birth year], 42 y.o.   MRN: [id number]  PCP: Harrison Mons, PA-C  Chief Complaint  Patient presents with  . Annual Exam    Subjective:   Presents for annual exam.   Resolving URI.  Cervical Cancer Screening: last pap test was 09/27/2013, cytology and HPV negative. Breast Cancer Screening: Counseled regarding screening annually beginning at 34 or every other year beginning at 28. Colorectal Cancer Screening: not yet a candidate Bone Density Testing: not yet a candidate HIV Screening: completed 02/04/2015, very low risk STI Screening: declined. Very low risk. Seasonal Influenza Vaccination: due Td/Tdap Vaccination: 05/28/2009. Pneumococcal Vaccination: Not yet a candidate. Zoster Vaccination: Not yet a candidate. Frequency of Dental evaluation: several years ago Frequency of Eye evaluation: last visit 2017 at [facility]  Constitutional: Negative.   HENT: Positive for congestion, postnasal drip, rhinorrhea and sinus pressure. Negative for dental problem, drooling, ear discharge, ear pain, facial swelling, hearing loss, mouth sores, nosebleeds, sinus pain, sneezing, sore throat, tinnitus, trouble swallowing and voice change.   Eyes: Negative.  Eye discharge: resolved.  Respiratory: Negative.   Cardiovascular: Negative.   Gastrointestinal: Negative.   Endocrine: Negative.   Genitourinary: Negative.   Musculoskeletal: Negative.   Skin: Positive for color change (salmon-colored lesion under the LEFT eyebrow). Negative for pallor, rash and wound.  Allergic/Immunologic: Positive for environmental allergies. Negative for food allergies and immunocompromised state.  Neurological: Positive for headaches.  Hematological: Negative.   Psychiatric/Behavioral: Negative.        Patient Active Problem List   Diagnosis Date Noted  . Elbow pain, left 08/17/2016  . Migraine with aura and without status migrainosus, not  intractable 02/09/2016  . 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 GERD (gastroesophageal reflux disease) 03/18/2015  . Vitamin D deficiency 02/04/2015  . Seasonal allergies 02/04/2015  . Multiple food allergies 02/04/2015  . Bunion   . Migraine with aura 01/22/2014  . Hypothyroidism 09/27/2013  . Mild persistent asthma in adult without complication 63/09/6008  . 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 POLYCYSTIC OVARIAN DISEASE 12/23/2006     Prior to Admission medications   Medication Sig Start Date End Date Taking? Authorizing Provider  albuterol (PROVENTIL HFA;VENTOLIN HFA) 108 (90 BASE) MCG/ACT inhaler Inhale 2 puffs into the lungs every 6 (six) hours as needed. 02/04/15  Yes Ashante Yellin, PA-C  Cholecalciferol (VITAMIN D) 2000 UNITS tablet Take 2,000 Units by mouth daily.   Yes [provider]  EPIPEN 2-PAK 0.3 MG/0.3ML SOAJ injection as needed. 06/21/16  Yes [provider]  levothyroxine (SYNTHROID, LEVOTHROID) 25 MCG tablet Take 1 tablet (25 mcg total) by mouth daily. OFFICE VISIT NEEDED 09/26/17  Yes Darien Mignogna, PA-C  metFORMIN (GLUCOPHAGE) 500 MG tablet TAKE 1 TABLET BY MOUTH TWICE A DAY 03/08/17  Yes Adrain Butrick, PA-C  mometasone (ASMANEX) 220 MCG/INH inhaler Inhale 2 puffs into the lungs daily as needed.   Yes [provider]  propranolol ER (INDERAL LA) 80 MG 24 hr capsule Take 1 capsule (80 mg total) by mouth daily. 09/26/17  Yes Penumalli, Earlean Polka, MD  ranitidine (ZANTAC) 150 MG tablet TAKE 1 TABLET BY MOUTH TWICE A DAY 03/08/17  Yes Ladona Rosten, PA-C  rizatriptan (MAXALT-MLT) 10 MG disintegrating tablet Take 1 tablet (10 mg total) by mouth as needed for migraine. May repeat in 2 hours if needed 09/26/17  Yes Penumalli, Earlean Polka, MD     Allergies  Allergen Reactions  .  Topamax [Topiramate] Other (See Comments)    Flushing, paresthesias (tingling, burning).         Objective:  Physical Exam  Constitutional: She is oriented to person, place, and  time. Vital signs are normal. She appears well-developed and well-nourished. She is active and cooperative. No distress.  BP 138/88   Pulse 89   Temp 98 F (36.7 C) (Oral)   Resp 16   Ht 5' 6.14" (1.68 m)   Wt 241 lb (109.3 kg)   LMP 10/18/2017 (Approximate)   SpO2 95%   BMI 38.73 kg/m    HENT:  Head: Normocephalic and atraumatic.  Right Ear: Hearing, tympanic membrane, external ear and ear canal normal. No foreign bodies.  Left Ear: Hearing, tympanic membrane, external ear and ear canal normal. No foreign bodies.  Nose: Nose normal.  Mouth/Throat: Uvula is midline, oropharynx is clear and moist and mucous membranes are normal. No oral lesions. Normal dentition. No dental abscesses or uvula swelling. No oropharyngeal exudate.  Eyes: Conjunctivae, EOM and lids are normal. Pupils are equal, round, and reactive to light. Right eye exhibits no discharge. Left eye exhibits no discharge. No scleral icterus.  Fundoscopic exam:      The right eye shows no arteriolar narrowing, no AV nicking, no exudate, no hemorrhage and no papilledema. The right eye shows red reflex.       The left eye shows no arteriolar narrowing, no AV nicking, no exudate, no hemorrhage and no papilledema. The left eye shows red reflex.  Neck: Trachea normal, normal range of motion and full passive range of motion without pain. Neck supple. No spinous process tenderness and no muscular tenderness present. No thyroid mass and no thyromegaly present.  Cardiovascular: Normal rate, regular rhythm, normal heart sounds, intact distal pulses and normal pulses.  Pulmonary/Chest: Effort normal and breath sounds normal. Right breast exhibits no inverted nipple, no mass, no nipple discharge, no skin change and no tenderness. Left breast exhibits no inverted nipple, no mass, no nipple discharge, no skin change and no tenderness. Breasts are symmetrical.  Very dense breasts  Musculoskeletal: She exhibits no edema or tenderness.        Cervical back: Normal.       Thoracic back: Normal.       Lumbar back: Normal.  Lymphadenopathy:       Head (right side): No tonsillar, no preauricular, no posterior auricular and no occipital adenopathy present.       Head (left side): No tonsillar, no preauricular, no posterior auricular and no occipital adenopathy present.    She has no cervical adenopathy.       Right: No supraclavicular adenopathy present.       Left: No supraclavicular adenopathy present.  Neurological: She is alert and oriented to person, place, and time. She has normal strength and normal reflexes. No cranial nerve deficit. She exhibits normal muscle tone. Coordination and gait normal.  Skin: Skin is warm, dry and intact. No rash noted. She is not diaphoretic. No cyanosis or erythema. Nails show no clubbing.  Psychiatric: She has a normal mood and affect. Her speech is normal and behavior is normal. Judgment and thought content normal.    Wt Readings from Last 3 Encounters:  10/18/17 241 lb (109.3 kg)  09/26/17 240 lb 3.2 oz (109 kg)  09/08/17 239 lb (108.4 kg)       Assessment & Plan:   Problem List Items Addressed This Visit    Vitamin D deficiency (Chronic)   Relevant  Orders   VITAMIN D 25 Hydroxy (Vit-D Deficiency, Fractures) (Completed)   POLYCYSTIC OVARIAN DISEASE   Hypothyroidism   Relevant Medications   levothyroxine (SYNTHROID, LEVOTHROID) 25 MCG tablet   Other Relevant Orders   TSH (Completed)    Other Visit Diagnoses    Annual physical exam    -  Primary   Age appropriate health guidance provided.   Need for influenza vaccination       Relevant Orders   Flu Vaccine QUAD 36+ mos IM (Completed)   Screening for deficiency anemia       Relevant Orders   CBC with Differential/Platelet (Completed)   Screening for hyperlipidemia       Relevant Orders   Lipid panel (Completed)   Screening for metabolic disorder       Relevant Orders   Comprehensive metabolic panel (Completed)   Screening  for blood or protein in urine       Unable to provide specimen.       Return in about 6 months (around 04/17/2018) for re-evaluation of thyroid.   Fara Chute, PA-C [facility]

## 2017-10-18 NOTE — Progress Notes (Signed)
Subjective:    Patient ID: Erin Warren, female    DOB: 1975/11/25, 42 y.o.   MRN: [id number]    HPI  Patient Active Problem List   Diagnosis Date Noted  . Elbow pain, left 08/17/2016  . Migraine with aura and without status migrainosus, not intractable 02/09/2016  . 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 GERD (gastroesophageal reflux disease) 03/18/2015  . Vitamin D deficiency 02/04/2015  . Seasonal allergies 02/04/2015  . Multiple food allergies 02/04/2015  . Bunion   . Migraine with aura 01/22/2014  . Hypothyroidism 09/27/2013  . Mild persistent asthma in adult without complication 56/21/3086  . 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 POLYCYSTIC OVARIAN DISEASE 12/23/2006   Allergies  Allergen Reactions  . Garlic   . Onion   . Topamax [Topiramate] Other (See Comments)    Flushing, paresthesias (tingling, burning).     Prior to Admission medications   Medication Sig Start Date End Date Taking? Authorizing Provider  albuterol (PROVENTIL HFA;VENTOLIN HFA) 108 (90 BASE) MCG/ACT inhaler Inhale 2 puffs into the lungs every 6 (six) hours as needed. 02/04/15  Yes Jeffery, Chelle, PA-C  Cholecalciferol (VITAMIN D) 2000 UNITS tablet Take 2,000 Units by mouth daily.   Yes [provider]  EPIPEN 2-PAK 0.3 MG/0.3ML SOAJ injection as needed. 06/21/16  Yes [provider]  levothyroxine (SYNTHROID, LEVOTHROID) 25 MCG tablet Take 1 tablet (25 mcg total) by mouth daily. 10/18/17  Yes Jeffery, Chelle, PA-C  loratadine (CLARITIN) 10 MG tablet Take 10 mg by mouth daily.   Yes [provider]  metFORMIN (GLUCOPHAGE) 500 MG tablet TAKE 1 TABLET BY MOUTH TWICE A DAY 03/08/17  Yes Jeffery, Chelle, PA-C  mometasone (ASMANEX) 220 MCG/INH inhaler Inhale 2 puffs into the lungs daily as needed.   Yes [provider]  propranolol ER (INDERAL LA) 80 MG 24 hr capsule Take 1 capsule (80 mg total) by mouth daily. 09/26/17  Yes Penumalli, Earlean Polka, MD  ranitidine (ZANTAC) 150 MG tablet TAKE  1 TABLET BY MOUTH TWICE A DAY 03/08/17  Yes Jeffery, Chelle, PA-C  rizatriptan (MAXALT-MLT) 10 MG disintegrating tablet Take 1 tablet (10 mg total) by mouth as needed for migraine. May repeat in 2 hours if needed 09/26/17  Yes Penumalli, Earlean Polka, MD    Past Medical History:  Diagnosis Date  . Allergy   . Asthma   . Bunion   . GERD (gastroesophageal reflux disease)   . Heel spur    Both heels, one has fractured off  . Hypertension   . Polycystic ovarian syndrome   . Thyroid disease    Social History   Socioeconomic History  . Marital status: Significant Other    Spouse name: Charlett Lango  . Number of children: 0  . Years of education: College gr  . Highest education level: Not on file  Social Needs  . Financial resource strain: Not on file  . Food insecurity - worry: Not on file  . Food insecurity - inability: Not on file  . Transportation needs - medical: Not on file  . Transportation needs - non-medical: Not on file  Occupational History  . 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 Smoking status: Never Smoker  . Smokeless tobacco: Never Used  Substance and Sexual Activity  . Alcohol use: Yes    Alcohol/week: 0.6 oz    Types: 1 Glasses of wine per week    Comment: 1-4/month  .  Drug use: No  . Sexual activity: Yes    Partners: Male    Birth control/protection: Surgical  Other Topics Concern  . Not on file  Social History Narrative   Patient lives at home with domestic partner.   Caffeine Use: 1-2 cups of coffee a day   Family History  Problem Relation Age of Onset  . Thyroid disease Mother   . Allergies Mother   . Hypertension Father   . Heart disease Father   . Cancer Father        skin  . Alcohol abuse Maternal Grandfather   . Hypertension Paternal Grandmother   . Hypertension Paternal Grandfather   . Diabetes Paternal Grandfather   . Hypertension Brother   . Heart Problems  Brother    Past Surgical History:  Procedure Laterality Date  . OOPHORECTOMY Left   . PLANTAR FASCIECTOMY Bilateral 08/2014  . TERATOMA EXCISION Left   . TUBAL LIGATION  2008    Review of Systems  Constitutional: Negative.   HENT: Positive for congestion, postnasal drip, rhinorrhea and sinus pressure.   Eyes: Positive for discharge (Present on Friday. Have since resolved.).  Respiratory: Negative.   Cardiovascular: Negative.   Gastrointestinal: Negative.   Endocrine: Negative.   Genitourinary: Negative.   Musculoskeletal: Negative.   Skin: Positive for color change (Salmon-colored patch in hair of left eyebrow.).  Allergic/Immunologic: Positive for environmental allergies and food allergies.  Neurological: Negative.   Hematological: Negative.   Psychiatric/Behavioral: Negative.        Objective:   Physical Exam  Constitutional: She is oriented to person, place, and time. She appears well-developed and well-nourished.  HENT:  Head: Normocephalic.  Right Ear: External ear normal.  Left Ear: External ear normal.  Nose: Nose normal.  Mouth/Throat: Oropharynx is clear and moist.  Eyes: Conjunctivae and EOM are normal. Pupils are equal, round, and reactive to light. Right eye exhibits no discharge. Left eye exhibits no discharge. No scleral icterus.  Neck: Normal range of motion. Neck supple.  Cardiovascular: Normal rate, regular rhythm, normal heart sounds and intact distal pulses.  Pulmonary/Chest: Effort normal and breath sounds normal.  Abdominal: Soft. Bowel sounds are normal.  Musculoskeletal: Normal range of motion.  Neurological: She is alert and oriented to person, place, and time. She has normal reflexes.  Skin: Skin is warm and dry.  Psychiatric: She has a normal mood and affect.          Assessment & Plan:  Patient's chronic conditions are well managed at this current juncture. She is due for a BMP, lipid panel, and TSH check, which will be done at this  visit. She is due for a flu vaccination which will be administered at this visit. Patient is scheduled up for follow up in a year, or earlier if she has questions or concerns.

## 2017-10-19 ENCOUNTER — Telehealth: Payer: Self-pay | Admitting: Physician Assistant

## 2017-10-19 LAB — COMPREHENSIVE METABOLIC PANEL
A/G RATIO: 1.2 (ref 1.2–2.2)
ALT: 23 IU/L (ref 0–32)
AST: 21 IU/L (ref 0–40)
Albumin: 4.1 g/dL (ref 3.5–5.5)
Alkaline Phosphatase: 110 IU/L (ref 39–117)
BUN/Creatinine Ratio: 12 (ref 9–23)
BUN: 9 mg/dL (ref 6–24)
Bilirubin Total: <0.2 mg/dL (ref 0.0–1.2)
CALCIUM: 10 mg/dL (ref 8.7–10.2)
CO2: 23 mmol/L (ref 20–29)
Chloride: 101 mmol/L (ref 96–106)
Creatinine, Ser: 0.74 mg/dL (ref 0.57–1.00)
GFR calc Af Amer: 115 mL/min/{1.73_m2} (ref >59)
GFR, EST NON AFRICAN AMERICAN: 100 mL/min/{1.73_m2} (ref >59)
Globulin, Total: 3.3 g/dL (ref 1.5–4.5)
Glucose: 82 mg/dL (ref 65–99)
POTASSIUM: 4.4 mmol/L (ref 3.5–5.2)
Sodium: 139 mmol/L (ref 134–144)
Total Protein: 7.4 g/dL (ref 6.0–8.5)

## 2017-10-19 LAB — CBC WITH DIFFERENTIAL/PLATELET
BASOS ABS: 0 10*3/uL (ref 0.0–0.2)
Basos: 0 %
EOS (ABSOLUTE): 0.4 10*3/uL (ref 0.0–0.4)
Eos: 4 %
Hematocrit: 42.6 % (ref 34.0–46.6)
Hemoglobin: 14.7 g/dL (ref 11.1–15.9)
Immature Grans (Abs): 0 10*3/uL (ref 0.0–0.1)
Immature Granulocytes: 0 %
Lymphocytes Absolute: 1.9 10*3/uL (ref 0.7–3.1)
Lymphs: 22 %
MCH: 30.9 pg (ref 26.6–33.0)
MCHC: 34.5 g/dL (ref 31.5–35.7)
MCV: 90 fL (ref 79–97)
MONOS ABS: 0.8 10*3/uL (ref 0.1–0.9)
Monocytes: 9 %
NEUTROS PCT: 65 %
Neutrophils Absolute: 5.8 10*3/uL (ref 1.4–7.0)
PLATELETS: 435 10*3/uL — AB (ref 150–379)
RBC: 4.76 x10E6/uL (ref 3.77–5.28)
RDW: 14.8 % (ref 12.3–15.4)
WBC: 8.9 10*3/uL (ref 3.4–10.8)

## 2017-10-19 LAB — LIPID PANEL
CHOL/HDL RATIO: 3.1 ratio (ref 0.0–4.4)
Cholesterol, Total: 168 mg/dL (ref 100–199)
HDL: 54 mg/dL (ref >39)
LDL Calculated: 88 mg/dL (ref 0–99)
TRIGLYCERIDES: 131 mg/dL (ref 0–149)
VLDL Cholesterol Cal: 26 mg/dL (ref 5–40)

## 2017-10-19 LAB — VITAMIN D 25 HYDROXY (VIT D DEFICIENCY, FRACTURES): Vit D, 25-Hydroxy: 44.3 ng/mL (ref 30.0–100.0)

## 2017-10-19 LAB — TSH: TSH: 2.93 u[IU]/mL (ref 0.450–4.500)

## 2017-10-19 NOTE — Telephone Encounter (Signed)
Lab results sent to patient in My Chart.  Form completed. Please copy and scan, and advise patient she can pick up the original at her convenience.  Form Placed in [employer] box at 104.

## 2017-10-28 NOTE — Telephone Encounter (Signed)
Message sent to [facility], [place] assistant.

## 2017-11-06 NOTE — Telephone Encounter (Signed)
What is the status on this?

## 2017-11-06 NOTE — Telephone Encounter (Signed)
No notes on the disposition of this.  Sent to [place] 9 days ago.

## 2017-11-08 NOTE — Telephone Encounter (Signed)
Phone call to patient. Left detailed message per signed release following up on paperwork - has she picked this up? If so, no need to call back. If she hasn't already, please call office. Will also send MyChart message.

## 2017-11-08 NOTE — Telephone Encounter (Signed)
So,  1. Please make sure that the patient has the lab results. 2. Please either find the form, or ask for a new one.  Once this is resolved, please sign and close the encounter, or route back to me when the new form is in my box to re-sign.

## 2017-12-08 ENCOUNTER — Encounter: Payer: Self-pay | Admitting: Physician Assistant

## 2017-12-14 ENCOUNTER — Encounter: Payer: Self-pay | Admitting: Physician Assistant

## 2017-12-21 ENCOUNTER — Ambulatory Visit: Payer: 59 | Admitting: Physician Assistant

## 2017-12-21 ENCOUNTER — Other Ambulatory Visit: Payer: Self-pay

## 2017-12-21 ENCOUNTER — Encounter: Payer: Self-pay | Admitting: Physician Assistant

## 2017-12-21 VITALS — BP 116/88 | HR 96 | Temp 98.8°F | Resp 16 | Ht 66.14 in | Wt 242.8 lb

## 2017-12-21 DIAGNOSIS — J302 Other seasonal allergic rhinitis: Secondary | ICD-10-CM | POA: Diagnosis not present

## 2017-12-21 MED ORDER — PREDNISONE 20 MG PO TABS
ORAL_TABLET | ORAL | 0 refills | Status: DC
Start: 2017-12-21 — End: 2018-02-03

## 2017-12-21 NOTE — Progress Notes (Signed)
Patient ID: Erin Warren, female    DOB: July 30, 1976, 42 y.o.   MRN: [id number]  PCP: Harrison Mons, PA-C  Chief Complaint  Patient presents with  . URI    chest congestion and cough, started last year and hasn't went away, white phlegm     Subjective:   Presents for evaluation of upper respiratory illness.  At her visit with me for [facility] in 10/2017, she noted increased upper respiratory symptoms, thought most likely viral vs allergic. Unfortunately, her symptoms have not resolved as expected. Cough persists, a little worse/more persistent, productive of white sputum. Nasal congestion, sinus pressure, also associated with migraine, nasal drainage is white. No dizziness. No fever, chills. No vomiting. Some nausea and diarrhea, associated with migraine HA.  Using daily loratadine, Flonase and Qvar.   Review of Systems As above    Patient Active Problem List   Diagnosis Date Noted  . Elbow pain, left 08/17/2016  . Migraine with aura and without status migrainosus, not intractable 02/09/2016  . 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 GERD (gastroesophageal reflux disease) 03/18/2015  . Vitamin D deficiency 02/04/2015  . Seasonal allergies 02/04/2015  . Multiple food allergies 02/04/2015  . Bunion   . Migraine with aura 01/22/2014  . Hypothyroidism 09/27/2013  . Mild persistent asthma in adult without complication 40/04/6760  . 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 POLYCYSTIC OVARIAN DISEASE 12/23/2006     Prior to Admission medications   Medication Sig Start Date End Date Taking? Authorizing Provider  albuterol (PROVENTIL HFA;VENTOLIN HFA) 108 (90 BASE) MCG/ACT inhaler Inhale 2 puffs into the lungs every 6 (six) hours as needed. 02/04/15  Yes Samanth Mirkin, PA-C  Cholecalciferol (VITAMIN D) 2000 UNITS tablet Take 2,000 Units by mouth daily.   Yes [provider]  EPIPEN 2-PAK 0.3 MG/0.3ML SOAJ injection as needed. 06/21/16  Yes [provider]    levothyroxine (SYNTHROID, LEVOTHROID) 25 MCG tablet Take 1 tablet (25 mcg total) by mouth daily. 10/18/17  Yes Satin Boal, PA-C  loratadine (CLARITIN) 10 MG tablet Take 10 mg by mouth daily.   Yes [provider]  metFORMIN (GLUCOPHAGE) 500 MG tablet TAKE 1 TABLET BY MOUTH TWICE A DAY 03/08/17  Yes Shalandria Elsbernd, PA-C  mometasone (ASMANEX) 220 MCG/INH inhaler Inhale 2 puffs into the lungs daily as needed.   Yes [provider]  propranolol ER (INDERAL LA) 80 MG 24 hr capsule Take 1 capsule (80 mg total) by mouth daily. 09/26/17  Yes Penumalli, Earlean Polka, MD  ranitidine (ZANTAC) 150 MG tablet TAKE 1 TABLET BY MOUTH TWICE A DAY 03/08/17  Yes Tacie Mccuistion, PA-C  rizatriptan (MAXALT-MLT) 10 MG disintegrating tablet Take 1 tablet (10 mg total) by mouth as needed for migraine. May repeat in 2 hours if needed 09/26/17  Yes Penumalli, Earlean Polka, MD  fluticasone (FLONASE) 50 MCG/ACT nasal spray SPRAY 2 SPRAYS INTO EACH NOSTRIL EVERY DAY 12/10/17   [provider]  QVAR REDIHALER 80 MCG/ACT inhaler TAKE 1 PUFF BY MOUTH TWICE A DAY 12/16/17   [provider]     Allergies  Allergen Reactions  . Garlic   . Onion   . Topamax [Topiramate] Other (See Comments)    Flushing, paresthesias (tingling, burning).         Objective:  Physical Exam  Constitutional: She is oriented to person, place, and time. She appears well-developed and well-nourished. She is active and cooperative. No distress.  BP 116/88   Pulse 96   Temp 98.8 F (37.1  C)   Resp 16   Ht 5' 6.14" (1.68 m)   Wt 242 lb 12.8 oz (110.1 kg)   SpO2 97%   BMI 39.02 kg/m   HENT:  Head: Normocephalic and atraumatic.    Right Ear: Hearing normal.  Left Ear: Hearing normal.  Eyes: Conjunctivae are normal. No scleral icterus.  Neck: Normal range of motion. Neck supple. No thyromegaly present.  Cardiovascular: Normal rate, regular rhythm and normal heart sounds.  Pulses:      Radial pulses are 2+ on the  right side, and 2+ on the left side.  Pulmonary/Chest: Effort normal and breath sounds normal.  Lymphadenopathy:       Head (right side): No tonsillar, no preauricular, no posterior auricular and no occipital adenopathy present.       Head (left side): No tonsillar, no preauricular, no posterior auricular and no occipital adenopathy present.    She has no cervical adenopathy.       Right: No supraclavicular adenopathy present.       Left: No supraclavicular adenopathy present.  Neurological: She is alert and oriented to person, place, and time. No sensory deficit.  Skin: Skin is warm, dry and intact. No rash noted. No cyanosis or erythema. Nails show no clubbing.  Psychiatric: She has a normal mood and affect. Her speech is normal and behavior is normal.           Assessment & Plan:   Problem List Items Addressed This Visit    Seasonal allergies - Primary (Chronic)    Exacerbated by recent increased pollen. Has triggered migraine HA. Low suspicion for bacterial sinusitis at this point. Continue routine treatment. ADD oral prednisone.      Relevant Medications   predniSONE (DELTASONE) 20 MG tablet       Return if symptoms worsen or fail to improve.   Fara Chute, PA-C [facility] [place]

## 2017-12-21 NOTE — Patient Instructions (Signed)
     IF you received an x-ray today, you will receive an invoice from [facility]. Please contact [facility] at [phone] with questions or concerns regarding your invoice.   IF you received labwork today, you will receive an invoice from [facility]. Please contact [facility] at [phone] with questions or concerns regarding your invoice.   Our billing staff will not be able to assist you with questions regarding bills from these companies.  You will be contacted with the lab results as soon as they are available. The fastest way to get your results is to activate your My Chart account. Instructions are located on the last page of this paperwork. If you have not heard from us regarding the results in 2 weeks, please contact this office.     

## 2017-12-22 NOTE — Assessment & Plan Note (Signed)
Exacerbated by recent increased pollen. Has triggered migraine HA. Low suspicion for bacterial sinusitis at this point. Continue routine treatment. ADD oral prednisone.

## 2017-12-31 ENCOUNTER — Ambulatory Visit (INDEPENDENT_AMBULATORY_CARE_PROVIDER_SITE_OTHER): Payer: 59

## 2017-12-31 ENCOUNTER — Other Ambulatory Visit: Payer: Self-pay

## 2017-12-31 ENCOUNTER — Ambulatory Visit: Payer: 59 | Admitting: Emergency Medicine

## 2017-12-31 ENCOUNTER — Encounter: Payer: Self-pay | Admitting: Emergency Medicine

## 2017-12-31 VITALS — BP 130/102 | HR 104 | Temp 98.2°F | Resp 20 | Ht 65.32 in | Wt 246.4 lb

## 2017-12-31 DIAGNOSIS — J302 Other seasonal allergic rhinitis: Secondary | ICD-10-CM | POA: Diagnosis not present

## 2017-12-31 DIAGNOSIS — R05 Cough: Secondary | ICD-10-CM

## 2017-12-31 DIAGNOSIS — R059 Cough, unspecified: Secondary | ICD-10-CM

## 2017-12-31 DIAGNOSIS — J22 Unspecified acute lower respiratory infection: Secondary | ICD-10-CM

## 2017-12-31 MED ORDER — BENZONATATE 200 MG PO CAPS: 200.0000 mg | ORAL_CAPSULE | Freq: Two times a day (BID) | ORAL | 0 refills | Status: DC | PRN

## 2017-12-31 MED ORDER — AZITHROMYCIN 250 MG PO TABS: ORAL_TABLET | ORAL | 0 refills | Status: DC

## 2017-12-31 NOTE — Progress Notes (Signed)
Erin Warren 42 y.o.   Chief Complaint  Patient presents with  . Cough    x6 months. Productive, white/grey/yellow colored sputum. Has tried OTC medicines (mucinex, decongestants), and recently took prednisone.    HISTORY OF PRESENT ILLNESS: This is a 42 y.o. female complaining of cough on and off for the past 6 months but worse the past several days.  Seen here last 12/21/2017 for the same and started on prednisone with some help but cough now turning productive of gray-yellowish phlegm.  Has a history of seasonal allergies and perhaps GERD as well.  Has a history of asthma, on 2 inhalers.  Has never seen a pulmonary doctor.  Also has a history of migraines, medications, history of thyroid disorder. Lives in an apartment where ventilation is not very good.  Exposed to neighbor's smoking.  Denies increased difficulty breathing or wheezing.  Denies fever or chills. HPI   Prior to Admission medications   Medication Sig Start Date End Date Taking? Authorizing Provider  albuterol (PROVENTIL HFA;VENTOLIN HFA) 108 (90 BASE) MCG/ACT inhaler Inhale 2 puffs into the lungs every 6 (six) hours as needed. 02/04/15  Yes Jeffery, Chelle, PA-C  Cholecalciferol (VITAMIN D) 2000 UNITS tablet Take 2,000 Units by mouth daily.   Yes [provider]  EPIPEN 2-PAK 0.3 MG/0.3ML SOAJ injection as needed. 06/21/16  Yes [provider]  fluticasone (FLONASE) 50 MCG/ACT nasal spray SPRAY 2 SPRAYS INTO EACH NOSTRIL EVERY DAY 12/10/17  Yes [provider]  levothyroxine (SYNTHROID, LEVOTHROID) 25 MCG tablet Take 1 tablet (25 mcg total) by mouth daily. 10/18/17  Yes Jeffery, Chelle, PA-C  loratadine (CLARITIN) 10 MG tablet Take 10 mg by mouth daily.   Yes [provider]  metFORMIN (GLUCOPHAGE) 500 MG tablet TAKE 1 TABLET BY MOUTH TWICE A DAY 03/08/17  Yes Jeffery, Chelle, PA-C  predniSONE (DELTASONE) 20 MG tablet Take 3 PO QAM x3days, 2 PO QAM x3days, 1 PO QAM x3days 12/21/17  Yes Jeffery,  Chelle, PA-C  propranolol ER (INDERAL LA) 80 MG 24 hr capsule Take 1 capsule (80 mg total) by mouth daily. 09/26/17  Yes Penumalli, Earlean Polka, MD  QVAR REDIHALER 80 MCG/ACT inhaler TAKE 1 PUFF BY MOUTH TWICE A DAY 12/16/17  Yes [provider]  ranitidine (ZANTAC) 150 MG tablet TAKE 1 TABLET BY MOUTH TWICE A DAY 03/08/17  Yes Jeffery, Chelle, PA-C  rizatriptan (MAXALT-MLT) 10 MG disintegrating tablet Take 1 tablet (10 mg total) by mouth as needed for migraine. May repeat in 2 hours if needed 09/26/17  Yes Penumalli, Earlean Polka, MD  mometasone [facility]) 220 MCG/INH inhaler Inhale 2 puffs into the lungs daily as needed.    [provider]    Allergies  Allergen Reactions  . Food Hives, Nausea Only and Swelling    WHEAT  . Garlic   . Onion   . Topamax [Topiramate] Other (See Comments)    Flushing, paresthesias (tingling, burning).      Patient Active Problem List   Diagnosis Date Noted  . Elbow pain, left 08/17/2016  . Migraine with aura and without status migrainosus, not intractable 02/09/2016  . 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 GERD (gastroesophageal reflux disease) 03/18/2015  . Vitamin D deficiency 02/04/2015  . Seasonal allergies 02/04/2015  . Multiple food allergies 02/04/2015  . Bunion   . Migraine with aura 01/22/2014  . Hypothyroidism 09/27/2013  . Mild persistent asthma in adult without complication 48/18/5631  . 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 POLYCYSTIC OVARIAN DISEASE 12/23/2006  Past Medical History:  Diagnosis Date  . Allergy   . Asthma   . Bunion   . GERD (gastroesophageal reflux disease)   . Heel spur    Both heels, one has fractured off  . Hypertension   . Polycystic ovarian syndrome   . Thyroid disease     Past Surgical History:  Procedure Laterality Date  . OOPHORECTOMY Left   . PLANTAR FASCIECTOMY Bilateral 08/2014  . TERATOMA EXCISION Left   . TUBAL LIGATION  2008    Social History   Socioeconomic History  . Marital status:  Significant Other    Spouse name: Charlett Lango  . Number of children: 0  . Years of education: College gr  . Highest education level: Not on file  Occupational History  . 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 Financial resource strain: Not on file  . Food insecurity:    Worry: Not on file    Inability: Not on file  . Transportation needs:    Medical: Not on file    Non-medical: Not on file  Tobacco Use  . Smoking status: Never Smoker  . Smokeless tobacco: Never Used  Substance and Sexual Activity  . Alcohol use: Yes    Alcohol/week: 0.6 oz    Types: 1 Glasses of wine per week    Comment: 1-4/month  . Drug use: No  . Sexual activity: Yes    Partners: Male    Birth control/protection: Surgical  Lifestyle  . Physical activity:    Days per week: Not on file    Minutes per session: Not on file  . Stress: Not on file  Relationships  . Social connections:    Talks on phone: Not on file    Gets together: Not on file    Attends religious service: Not on file    Active member of club or organization: Not on file    Attends meetings of clubs or organizations: Not on file    Relationship status: Not on file  . Intimate partner violence:    Fear of current or ex partner: Not on file    Emotionally abused: Not on file    Physically abused: Not on file    Forced sexual activity: Not on file  Other Topics Concern  . Not on file  Social History Narrative   Patient lives at home with domestic partner.   Caffeine Use: 1-2 cups of coffee a day    Family History  Problem Relation Age of Onset  . Thyroid disease Mother   . Allergies Mother   . Hypertension Father   . Heart disease Father   . Cancer Father        skin  . Alcohol abuse Maternal Grandfather   . Hypertension Paternal Grandmother   . Hypertension Paternal Grandfather   . Diabetes Paternal Grandfather   . Hypertension Brother   .  Heart Problems Brother      Review of Systems  Constitutional: Negative.  Negative for chills and fever.  HENT: Negative.  Negative for sinus pain and sore throat.   Eyes: Negative.  Negative for blurred vision and double vision.  Respiratory: Positive for cough and sputum production. Negative for hemoptysis, shortness of breath and wheezing.   Cardiovascular: Negative.  Negative for chest pain and palpitations.  Gastrointestinal: Negative.  Negative for abdominal pain, diarrhea, nausea and vomiting.  Genitourinary: Negative.  Negative for  hematuria.  Musculoskeletal: Negative.  Negative for myalgias and neck pain.  Skin: Negative.  Negative for rash.  Neurological: Negative.  Negative for dizziness and headaches.  Endo/Heme/Allergies: Negative.   All other systems reviewed and are negative.    Vitals:   12/31/17 1448  BP: (!) 130/102  Pulse: (!) 104  Resp: 20  Temp: 98.2 F (36.8 C)  SpO2: 96%     Physical Exam  Constitutional: She is oriented to person, place, and time. She appears well-developed and well-nourished.  Morbidly obese  HENT:  Head: Normocephalic and atraumatic.  Nose: Nose normal.  Mouth/Throat: Oropharynx is clear and moist.  Eyes: Pupils are equal, round, and reactive to light. Conjunctivae and EOM are normal.  Neck: Normal range of motion. Neck supple.  Cardiovascular: Normal rate, regular rhythm and normal heart sounds.  Pulmonary/Chest: Effort normal and breath sounds normal. She has no wheezes. She has no rales.  Abdominal: Soft. There is no tenderness.  Musculoskeletal: Normal range of motion.  Neurological: She is alert and oriented to person, place, and time. No sensory deficit. She exhibits normal muscle tone.  Skin: Skin is warm and dry. Capillary refill takes less than 2 seconds. No rash noted.  Psychiatric: She has a normal mood and affect. Her behavior is normal.    Dg Chest 2 View  Result Date: 12/31/2017 CLINICAL DATA:  Persistent  cough for 6 months EXAM: CHEST - 2 VIEW COMPARISON:  None. FINDINGS: Normal cardiac and mediastinal contours. No consolidative pulmonary opacities. No pleural effusion or pneumothorax. Thoracic spine degenerative changes. IMPRESSION: No acute cardiopulmonary process. Electronically Signed   By: Lovey Newcomer M.D.   On: 12/31/2017 15:58    ASSESSMENT & PLAN:  Erin Warren was seen today for cough.  Diagnoses and all orders for this visit:  Cough -     benzonatate (TESSALON) 200 MG capsule; Take 1 capsule (200 mg total) by mouth 2 (two) times daily as needed for cough. -     Ambulatory referral to Pulmonology -     DG Chest 2 View; Future  Seasonal allergies  Lower respiratory infection -     azithromycin (ZITHROMAX) 250 MG tablet; Sig as indicated -     DG Chest 2 View; Future    Patient Instructions       IF you received an x-ray today, you will receive an invoice from [facility]. Please contact [facility] at [phone] with questions or concerns regarding your invoice.   IF you received labwork today, you will receive an invoice from [place]. Please contact [facility] at [phone] with questions or concerns regarding your invoice.   Our billing staff will not be able to assist you with questions regarding bills from these companies.  You will be contacted with the lab results as soon as they are available. The fastest way to get your results is to activate your My Chart account. Instructions are located on the last page of this paperwork. If you have not heard from [name] regarding the results in 2 weeks, please contact this office.     Cough, Adult A cough helps to clear your throat and lungs. A cough may last only 2-3 weeks (acute), or it may last longer than 8 weeks (chronic). Many different things can cause a cough. A cough may be a sign of an illness or another medical condition. Follow these instructions at home:  Pay attention to any changes in your  cough.  Take medicines only as told by your doctor. ?  If you were prescribed an antibiotic medicine, take it as told by your doctor. Do not stop taking it even if you start to feel better. ? Talk with your doctor before you try using a cough medicine.  Drink enough fluid to keep your pee (urine) clear or pale yellow.  If the air is dry, use a cold steam vaporizer or humidifier in your home.  Stay away from things that make you cough at work or at home.  If your cough is worse at night, try using extra pillows to raise your head up higher while you sleep.  Do not smoke, and try not to be around smoke. If you need help quitting, ask your doctor.  Do not have caffeine.  Do not drink alcohol.  Rest as needed. Contact a doctor if:  You have new problems (symptoms).  You cough up yellow fluid (pus).  Your cough does not get better after 2-3 weeks, or your cough gets worse.  Medicine does not help your cough and you are not sleeping well.  You have pain that gets worse or pain that is not helped with medicine.  You have a fever.  You are losing weight and you do not know why.  You have night sweats. Get help right away if:  You cough up blood.  You have trouble breathing.  Your heartbeat is very fast. This information is not intended to replace advice given to you by your health care provider. Make sure you discuss any questions you have with your health care provider. Document Released: 05/06/2011 Document Revised: 01/29/2016 Document Reviewed: 10/30/2014 Elsevier Interactive Patient Education  2018 Elsevier Inc.     Agustina Caroli, MD [facility]

## 2017-12-31 NOTE — Patient Instructions (Addendum)
     IF you received an x-ray today, you will receive an invoice from [facility]. Please contact [facility] at [phone] with questions or concerns regarding your invoice.   IF you received labwork today, you will receive an invoice from [facility]. Please contact [facility] at [phone] with questions or concerns regarding your invoice.   Our billing staff will not be able to assist you with questions regarding bills from these companies.  You will be contacted with the lab results as soon as they are available. The fastest way to get your results is to activate your My Chart account. Instructions are located on the last page of this paperwork. If you have not heard from us regarding the results in 2 weeks, please contact this office.     Cough, Adult A cough helps to clear your throat and lungs. A cough may last only 2-3 weeks (acute), or it may last longer than 8 weeks (chronic). Many different things can cause a cough. A cough may be a sign of an illness or another medical condition. Follow these instructions at home:  Pay attention to any changes in your cough.  Take medicines only as told by your doctor. ? If you were prescribed an antibiotic medicine, take it as told by your doctor. Do not stop taking it even if you start to feel better. ? Talk with your doctor before you try using a cough medicine.  Drink enough fluid to keep your pee (urine) clear or pale yellow.  If the air is dry, use a cold steam vaporizer or humidifier in your home.  Stay away from things that make you cough at work or at home.  If your cough is worse at night, try using extra pillows to raise your head up higher while you sleep.  Do not smoke, and try not to be around smoke. If you need help quitting, ask your doctor.  Do not have caffeine.  Do not drink alcohol.  Rest as needed. Contact a doctor if:  You have new problems (symptoms).  You cough up yellow fluid  (pus).  Your cough does not get better after 2-3 weeks, or your cough gets worse.  Medicine does not help your cough and you are not sleeping well.  You have pain that gets worse or pain that is not helped with medicine.  You have a fever.  You are losing weight and you do not know why.  You have night sweats. Get help right away if:  You cough up blood.  You have trouble breathing.  Your heartbeat is very fast. This information is not intended to replace advice given to you by your health care provider. Make sure you discuss any questions you have with your health care provider. Document Released: 05/06/2011 Document Revised: 01/29/2016 Document Reviewed: 10/30/2014 Elsevier Interactive Patient Education  2018 Elsevier Inc.  

## 2018-02-03 ENCOUNTER — Other Ambulatory Visit (INDEPENDENT_AMBULATORY_CARE_PROVIDER_SITE_OTHER): Payer: 59

## 2018-02-03 ENCOUNTER — Ambulatory Visit (INDEPENDENT_AMBULATORY_CARE_PROVIDER_SITE_OTHER): Payer: 59 | Admitting: Pulmonary Disease

## 2018-02-03 ENCOUNTER — Encounter: Payer: Self-pay | Admitting: Pulmonary Disease

## 2018-02-03 VITALS — BP 142/86 | HR 95 | Ht 66.0 in | Wt 246.0 lb

## 2018-02-03 DIAGNOSIS — J45909 Unspecified asthma, uncomplicated: Secondary | ICD-10-CM

## 2018-02-03 LAB — CBC WITH DIFFERENTIAL/PLATELET
BASOS PCT: 0.6 % (ref 0.0–3.0)
Basophils Absolute: 0.1 10*3/uL (ref 0.0–0.1)
Eosinophils Absolute: 0.2 10*3/uL (ref 0.0–0.7)
Eosinophils Relative: 1.9 % (ref 0.0–5.0)
HCT: 42.9 % (ref 36.0–46.0)
HEMOGLOBIN: 14.6 g/dL (ref 12.0–15.0)
Lymphocytes Relative: 19.8 % (ref 12.0–46.0)
Lymphs Abs: 2.3 10*3/uL (ref 0.7–4.0)
MCHC: 34.1 g/dL (ref 30.0–36.0)
MCV: 90.8 fl (ref 78.0–100.0)
MONO ABS: 0.7 10*3/uL (ref 0.1–1.0)
Monocytes Relative: 6.2 % (ref 3.0–12.0)
Neutro Abs: 8.3 10*3/uL — ABNORMAL HIGH (ref 1.4–7.7)
Neutrophils Relative %: 71.5 % (ref 43.0–77.0)
PLATELETS: 375 10*3/uL (ref 150.0–400.0)
RBC: 4.72 Mil/uL (ref 3.87–5.11)
RDW: 14.4 % (ref 11.5–15.5)
WBC: 11.6 10*3/uL — AB (ref 4.0–10.5)

## 2018-02-03 LAB — NITRIC OXIDE: Nitric Oxide: 9

## 2018-02-03 NOTE — Patient Instructions (Signed)
We will get pulmonary function test, CBC differential and blood allergy profile Schedule full pulmonary function test Continue using the Qvar and albuterol We will determine if you need any other inhaler for review of these tests. Follow-up after PFTs

## 2018-02-03 NOTE — Progress Notes (Signed)
Erin Warren    [id number]    Feb 04, 1976  Primary Care Physician:Jeffery, Domingo Mend PA-C  Referring Physician: Horald Pollen, MD [place],  [id number]  Chief complaint: Consult for asthma  HPI: 42 year old with history of asthma.  Diagnosed in 2012.  Maintained on Qvar and albuterol Follows up with [facility] for seasonal allergies, food allergies Seen at her primary care in April for bronchitis and given Z-Pak.  Chest x-ray at that time did not show acute cardiopulmonary normality. She returns to clinic today with baseline symptoms of dyspnea with activity, cough with white mucus, occasional wheezing.  She is using albuterol about once a week. She has seasonal allergies.  No acid reflux.  Pets: Cats, no birds, farm animals Occupation: Works in a desk job at a bank Exposures: No mold, dampness, hot tub, Jacuzzi.  She has carpets at home, clean features Lives in an apartment with poor ventilation.  Exposed to secondhand smoke from neighbors. Smoking history: Non-smoker Travel history: No significant travel history Relevant family history: No significant family history of lung issues  Outpatient Encounter Medications as of 02/03/2018  Medication Sig  . albuterol (PROVENTIL HFA;VENTOLIN HFA) 108 (90 BASE) MCG/ACT inhaler Inhale 2 puffs into the lungs every 6 (six) hours as needed.  . benzonatate (TESSALON) 200 MG capsule Take 1 capsule (200 mg total) by mouth 2 (two) times daily as needed for cough.  . Cholecalciferol (VITAMIN D) 2000 UNITS tablet Take 2,000 Units by mouth daily.  [name] EPIPEN 2-PAK 0.3 MG/0.3ML SOAJ injection as needed.  . fluticasone (FLONASE) 50 MCG/ACT nasal spray SPRAY 2 SPRAYS INTO EACH NOSTRIL EVERY DAY  . levothyroxine (SYNTHROID, LEVOTHROID) 25 MCG tablet Take 1 tablet (25 mcg total) by mouth daily.  [name] loratadine (CLARITIN) 10 MG tablet Take 10 mg by mouth daily.  . metFORMIN (GLUCOPHAGE) 500 MG tablet TAKE 1 TABLET BY MOUTH  TWICE A DAY  . propranolol ER (INDERAL LA) 80 MG 24 hr capsule Take 1 capsule (80 mg total) by mouth daily.  [name] QVAR REDIHALER 80 MCG/ACT inhaler TAKE 1 PUFF BY MOUTH TWICE A DAY  . ranitidine (ZANTAC) 150 MG tablet TAKE 1 TABLET BY MOUTH TWICE A DAY  . rizatriptan (MAXALT-MLT) 10 MG disintegrating tablet Take 1 tablet (10 mg total) by mouth as needed for migraine. May repeat in 2 hours if needed  . [DISCONTINUED] azithromycin (ZITHROMAX) 250 MG tablet Sig as indicated  . [DISCONTINUED] mometasone (ASMANEX) 220 MCG/INH inhaler Inhale 2 puffs into the lungs daily as needed.  . [DISCONTINUED] predniSONE (DELTASONE) 20 MG tablet Take 3 PO QAM x3days, 2 PO QAM x3days, 1 PO QAM x3days   No facility-administered encounter medications on file as of 02/03/2018.     Allergies as of 02/03/2018 - Review Complete 12/31/2017  Allergen Reaction Noted  . Food Hives, Nausea Only, and Swelling 12/21/2017  . Garlic  29/92/4268  . Onion  10/18/2017  . Topamax [topiramate] Other (See Comments) 10/29/2013    Past Medical History:  Diagnosis Date  . Allergy   . Asthma   . Bunion   . GERD (gastroesophageal reflux disease)   . Heel spur    Both heels, one has fractured off  . Hypertension   . Polycystic ovarian syndrome   . Thyroid disease     Past Surgical History:  Procedure Laterality Date  . OOPHORECTOMY Left   . PLANTAR FASCIECTOMY Bilateral 08/2014  . TERATOMA EXCISION Left   .  TUBAL LIGATION  2008    Family History  Problem Relation Age of Onset  . Thyroid disease Mother   . Allergies Mother   . Hypertension Father   . Heart disease Father   . Cancer Father        skin  . Alcohol abuse Maternal Grandfather   . Hypertension Paternal Grandmother   . Hypertension Paternal Grandfather   . Diabetes Paternal Grandfather   . Hypertension Brother   . Heart Problems Brother     Social History   Socioeconomic History  . Marital status: Significant Other    Spouse name: Charlett Lango  . Number of children: 0  . Years of education: College gr  . Highest education level: Not on file  Occupational History  . 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 Financial resource strain: Not on file  . Food insecurity:    Worry: Not on file    Inability: Not on file  . Transportation needs:    Medical: Not on file    Non-medical: Not on file  Tobacco Use  . Smoking status: Never Smoker  . Smokeless tobacco: Never Used  Substance and Sexual Activity  . Alcohol use: Yes    Alcohol/week: 0.6 oz    Types: 1 Glasses of wine per week    Comment: 1-4/month  . Drug use: No  . Sexual activity: Yes    Partners: Male    Birth control/protection: Surgical  Lifestyle  . Physical activity:    Days per week: Not on file    Minutes per session: Not on file  . Stress: Not on file  Relationships  . Social connections:    Talks on phone: Not on file    Gets together: Not on file    Attends religious service: Not on file    Active member of club or organization: Not on file    Attends meetings of clubs or organizations: Not on file    Relationship status: Not on file  . Intimate partner violence:    Fear of current or ex partner: Not on file    Emotionally abused: Not on file    Physically abused: Not on file    Forced sexual activity: Not on file  Other Topics Concern  . Not on file  Social History Narrative   Patient lives at home with domestic partner.   Caffeine Use: 1-2 cups of coffee a day    Review of systems: Review of Systems  Constitutional: Negative for fever and chills.  HENT: Negative.   Eyes: Negative for blurred vision.  Respiratory: as per HPI  Cardiovascular: Negative for chest pain and palpitations.  Gastrointestinal: Negative for vomiting, diarrhea, blood per rectum. Genitourinary: Negative for dysuria, urgency, frequency and hematuria.  Musculoskeletal: Negative for  myalgias, back pain and joint pain.  Skin: Negative for itching and rash.  Neurological: Negative for dizziness, tremors, focal weakness, seizures and loss of consciousness.  Endo/Heme/Allergies: Negative for environmental allergies.  Psychiatric/Behavioral: Negative for depression, suicidal ideas and hallucinations.  All other systems reviewed and are negative.  Physical Exam: Blood pressure (!) 142/86, pulse 95, height 5\' 6"  (1.676 m), weight 246 lb (111.6 kg), SpO2 97 %. Gen:      No acute distress HEENT:  EOMI, sclera anicteric Neck:     No masses; no thyromegaly Lungs:    Clear to auscultation bilaterally; normal respiratory effort CV:  Regular rate and rhythm; no murmurs Abd:      + bowel sounds; soft, non-tender; no palpable masses, no distension Ext:    No edema; adequate peripheral perfusion Skin:      Warm and dry; no rash Neuro: alert and oriented x 3 Psych: normal mood and affect  Data Reviewed: CT abdomen 10/04/2006- visualized lung bases are clear Chest x-ray 12/31/2017- no acute cardiopulmonary abnormalities  FENO 02/03/2018-9  CBC 10/18/2017-WBC 8.9, eos 0%  Assessment:  Mild persistent asthma Check CBC differential, blood allergy profile and PFTs of better evaluation Get records from allergist regarding her recent spirometry Continue Qvar and albuterol as needed for now.  Determine if she needs any additional inhaler based on review of the tests.  Plan/Recommendations: - PFTs, CBC differential, blood allergy profile - Continue Qvar, albuterol - Review records from allergy.  Marshell Garfinkel MD [facility] 02/03/2018, 3:51 PM  CC: Agustina Caroli Olney, *

## 2018-02-06 LAB — RESPIRATORY ALLERGY PROFILE REGION II ~~LOC~~
ALLERGEN, D PTERNOYSSINUS, D1: 0.21 kU/L — AB
Allergen, A. alternata, m6: <0.1 kU/L
Allergen, Cedar tree, t12: <0.1 kU/L
Allergen, Comm Silver Birch, t9: <0.1 kU/L
Allergen, Oak,t7: <0.1 kU/L
Allergen, P. notatum, m1: <0.1 kU/L
CLADOSPORIUM HERBARUM (M2) IGE: <0.1 kU/L
CLASS: 0
CLASS: 0
CLASS: 0
CLASS: 0
CLASS: 0
CLASS: 0
CLASS: 0
CLASS: 0
CLASS: 0
COCKROACH: 0.42 kU/L — AB
COMMON RAGWEED (SHORT) (W1) IGE: <0.1 kU/L
Cat Dander: <0.1 kU/L
Class: 0
Class: 0
Class: 0
Class: 0
Class: 0
Class: 0
Class: 0
Class: 0
Class: 0
Class: 0
Class: 0
Class: 0
Class: 0
Class: 0
Class: 1
D. farinae: 0.2 kU/L — ABNORMAL HIGH
IgE (Immunoglobulin E), Serum: 227 kU/L — ABNORMAL HIGH (ref <=114)
Pecan/Hickory Tree IgE: <0.1 kU/L

## 2018-02-06 LAB — INTERPRETATION:

## 2018-02-11 ENCOUNTER — Other Ambulatory Visit: Payer: Self-pay | Admitting: Physician Assistant

## 2018-03-01 NOTE — Progress Notes (Signed)
Called and left a detailed msg ok per [facility]

## 2018-03-07 ENCOUNTER — Other Ambulatory Visit: Payer: Self-pay | Admitting: Physician Assistant

## 2018-03-07 DIAGNOSIS — K219 Gastro-esophageal reflux disease without esophagitis: Secondary | ICD-10-CM

## 2018-03-07 DIAGNOSIS — E282 Polycystic ovarian syndrome: Secondary | ICD-10-CM

## 2018-03-07 NOTE — Telephone Encounter (Signed)
ranitidine refill Last Refill:03/08/17 # 180 3 RF Last OV: 02/10/16 PCP: Grant Fontana MD Pharmacy:[facility]  Metformin  refill Last Refill:03/08/17 # 180 3 RF Last OV: 02/04/15 Last HGB A1C: 09/27/13

## 2018-03-14 ENCOUNTER — Ambulatory Visit: Payer: 59 | Admitting: Pulmonary Disease

## 2018-03-14 ENCOUNTER — Ambulatory Visit (INDEPENDENT_AMBULATORY_CARE_PROVIDER_SITE_OTHER): Payer: 59 | Admitting: Pulmonary Disease

## 2018-03-14 ENCOUNTER — Encounter: Payer: Self-pay | Admitting: Pulmonary Disease

## 2018-03-14 VITALS — BP 140/78 | HR 90 | Ht 66.0 in | Wt 246.4 lb

## 2018-03-14 DIAGNOSIS — J45909 Unspecified asthma, uncomplicated: Secondary | ICD-10-CM | POA: Diagnosis not present

## 2018-03-14 LAB — PULMONARY FUNCTION TEST
DL/VA % pred: 129 %
DL/VA: 6.54 ml/min/mmHg/L
DLCO COR % PRED: 113 %
DLCO COR: 30.48 ml/min/mmHg
DLCO UNC: 31.55 ml/min/mmHg
DLCO unc % pred: 117 %
FEF 25-75 POST: 3.1 L/s
FEF 25-75 PRE: 4.04 L/s
FEF2575-%CHANGE-POST: -23 %
FEF2575-%PRED-PRE: 130 %
FEF2575-%Pred-Post: 99 %
FEV1-%CHANGE-POST: -6 %
FEV1-%PRED-POST: 88 %
FEV1-%PRED-PRE: 94 %
FEV1-Post: 2.76 L
FEV1-Pre: 2.95 L
FEV1FVC-%Change-Post: -3 %
FEV1FVC-%Pred-Pre: 109 %
FEV6-%Change-Post: -3 %
FEV6-%PRED-POST: 84 %
FEV6-%Pred-Pre: 87 %
FEV6-Post: 3.21 L
FEV6-Pre: 3.33 L
FEV6FVC-%Pred-Post: 102 %
FEV6FVC-%Pred-Pre: 102 %
FVC-%Change-Post: -3 %
FVC-%Pred-Post: 82 %
FVC-%Pred-Pre: 85 %
FVC-Post: 3.21 L
FVC-Pre: 3.33 L
PRE FEV6/FVC RATIO: 100 %
Post FEV1/FVC ratio: 86 %
Post FEV6/FVC ratio: 100 %
Pre FEV1/FVC ratio: 89 %
RV % pred: 62 %
RV: 1.1 L
TLC % pred: 92 %
TLC: 4.94 L

## 2018-03-14 LAB — NITRIC OXIDE: Nitric Oxide: 21

## 2018-03-14 NOTE — Progress Notes (Signed)
Erin Warren    [id number]    April 11, 1976  Primary Care Physician:Jeffery, Domingo Mend PA-C  Referring Physician: Harrison Mons, PA-C No address on file  Chief complaint: Follow-up for asthma  HPI: 42 year old with history of asthma.  Diagnosed in 2012.  Maintained on Qvar and albuterol Follows up with [facility] for seasonal allergies, food allergies Seen at her primary care in April for bronchitis and given Z-Pak.  Chest x-ray at that time did not show acute cardiopulmonary normality. She returns to clinic today with baseline symptoms of dyspnea with activity, cough with white mucus, occasional wheezing.  She is using albuterol about once a week. She has seasonal allergies.  No acid reflux.  Pets: Cats, no birds, farm animals Occupation: Works in a desk job at a bank Exposures: No mold, dampness, hot tub, Jacuzzi.  She has carpets at home, clean features Lives in an apartment with poor ventilation.  Exposed to secondhand smoke from neighbors. Smoking history: Non-smoker Travel history: No significant travel history Relevant family history: No significant family history of lung issues  Interim history: She is here for review of PFTs.  States that overall her breathing is doing better than last visit.  Cough has improved Denies any dyspnea, wheezing, mucus production  Reviewed notes from [facility] dated 07/14/2017 Being followed for asthma, allergies.  Not on immunotherapy Maintained on Qvar, albuterol as needed Allergic rhinitis treated with Loratadine, Flonase  Outpatient Encounter Medications as of 03/14/2018  Medication Sig  . albuterol (PROVENTIL HFA;VENTOLIN HFA) 108 (90 BASE) MCG/ACT inhaler Inhale 2 puffs into the lungs every 6 (six) hours as needed.  . benzonatate (TESSALON) 200 MG capsule Take 1 capsule (200 mg total) by mouth 2 (two) times daily as needed for cough.  . Cholecalciferol (VITAMIN D) 2000 UNITS tablet Take 2,000 Units by mouth daily.  [name]  EPIPEN 2-PAK 0.3 MG/0.3ML SOAJ injection as needed.  . fluticasone (FLONASE) 50 MCG/ACT nasal spray SPRAY 2 SPRAYS INTO EACH NOSTRIL EVERY DAY  . fluticasone (FLONASE) 50 MCG/ACT nasal spray SPRAY 2 SPRAYS INTO EACH NOSTRIL EVERY DAY  . levothyroxine (SYNTHROID, LEVOTHROID) 25 MCG tablet Take 1 tablet (25 mcg total) by mouth daily.  [name] loratadine (CLARITIN) 10 MG tablet Take 10 mg by mouth daily.  . metFORMIN (GLUCOPHAGE) 500 MG tablet TAKE 1 TABLET BY MOUTH TWICE A DAY  . propranolol ER (INDERAL LA) 80 MG 24 hr capsule Take 1 capsule (80 mg total) by mouth daily.  [name] QVAR REDIHALER 80 MCG/ACT inhaler TAKE 1 PUFF BY MOUTH TWICE A DAY  . ranitidine (ZANTAC) 150 MG tablet Take 1 tablet (150 mg total) by mouth 2 (two) times daily. No more refills without office visit  . rizatriptan (MAXALT-MLT) 10 MG disintegrating tablet Take 1 tablet (10 mg total) by mouth as needed for migraine. May repeat in 2 hours if needed   No facility-administered encounter medications on file as of 03/14/2018.     Allergies as of 03/14/2018 - Review Complete 03/14/2018  Allergen Reaction Noted  . Food Hives, Nausea Only, and Swelling 12/21/2017  . Garlic  88/50/2774  . Onion  10/18/2017  . Topamax [topiramate] Other (See Comments) 10/29/2013    Past Medical History:  Diagnosis Date  . Allergy   . Asthma   . Bunion   . GERD (gastroesophageal reflux disease)   . Heel spur    Both heels, one has fractured off  . Hypertension   . Polycystic ovarian syndrome   .  Thyroid disease     Past Surgical History:  Procedure Laterality Date  . OOPHORECTOMY Left   . PLANTAR FASCIECTOMY Bilateral 08/2014  . TERATOMA EXCISION Left   . TUBAL LIGATION  2008    Family History  Problem Relation Age of Onset  . Thyroid disease Mother   . Allergies Mother   . Hypertension Father   . Heart disease Father   . Cancer Father        skin  . Alcohol abuse Maternal Grandfather   . Hypertension Paternal Grandmother   .  Hypertension Paternal Grandfather   . Diabetes Paternal Grandfather   . Hypertension Brother   . Heart Problems Brother     Social History   Socioeconomic History  . Marital status: Significant Other    Spouse name: Charlett Lango  . Number of children: 0  . Years of education: College gr  . Highest education level: Not on file  Occupational History  . 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 Financial resource strain: Not on file  . Food insecurity:    Worry: Not on file    Inability: Not on file  . Transportation needs:    Medical: Not on file    Non-medical: Not on file  Tobacco Use  . Smoking status: Never Smoker  . Smokeless tobacco: Never Used  Substance and Sexual Activity  . Alcohol use: Yes    Alcohol/week: 0.6 oz    Types: 1 Glasses of wine per week    Comment: 1-4/month  . Drug use: No  . Sexual activity: Yes    Partners: Male    Birth control/protection: Surgical  Lifestyle  . Physical activity:    Days per week: Not on file    Minutes per session: Not on file  . Stress: Not on file  Relationships  . Social connections:    Talks on phone: Not on file    Gets together: Not on file    Attends religious service: Not on file    Active member of club or organization: Not on file    Attends meetings of clubs or organizations: Not on file    Relationship status: Not on file  . Intimate partner violence:    Fear of current or ex partner: Not on file    Emotionally abused: Not on file    Physically abused: Not on file    Forced sexual activity: Not on file  Other Topics Concern  . Not on file  Social History Narrative   Patient lives at home with domestic partner.   Caffeine Use: 1-2 cups of coffee a day    Review of systems: Review of Systems  Constitutional: Negative for fever and chills.  HENT: Negative.   Eyes: Negative for blurred vision.  Respiratory: as per HPI    Cardiovascular: Negative for chest pain and palpitations.  Gastrointestinal: Negative for vomiting, diarrhea, blood per rectum. Genitourinary: Negative for dysuria, urgency, frequency and hematuria.  Musculoskeletal: Negative for myalgias, back pain and joint pain.  Skin: Negative for itching and rash.  Neurological: Negative for dizziness, tremors, focal weakness, seizures and loss of consciousness.  Endo/Heme/Allergies: Negative for environmental allergies.  Psychiatric/Behavioral: Negative for depression, suicidal ideas and hallucinations.  All other systems reviewed and are negative.  Physical Exam: Blood pressure 140/78, pulse 90, height 5\' 6"  (1.676 m), weight 246 lb 6.4 oz (111.8 kg), SpO2 96 %. Gen:  No acute distress HEENT:  EOMI, sclera anicteric Neck:     No masses; no thyromegaly Lungs:    Clear to auscultation bilaterally; normal respiratory effort CV:         Regular rate and rhythm; no murmurs Abd:      + bowel sounds; soft, non-tender; no palpable masses, no distension Ext:    No edema; adequate peripheral perfusion Skin:      Warm and dry; no rash Neuro: alert and oriented x 3 Psych: normal mood and affect  Data Reviewed: CT abdomen 10/04/2006- visualized lung bases are clear Chest x-ray 12/31/2017- no acute cardiopulmonary abnormalities I reviewed the images personally.  PFTs 03/14/2018 FVC 3.21 [82%], FEV1 2.76 [88%], FVC 3.83 [98%] F/F 86, TLC 92%, DLCO/VA 129% No obstruction, air trapping and increased diffusion capacity.  FENO 02/03/2018-9 FENO 03/14/2018-21  Labs CBC 10/18/2017-WBC 8.9, eos 0% CBC 01/24/2018-WBC 11.6, eos 1.9%, absolute eosinophil count 200  Blood allergy profile 02/03/2018-IgE to 27, RAST panel shows mild sensitivity to cockroach, dust mite  Assessment:  Mild persistent asthma Cough is improving after her recent episode of bronchitis Continues on Qvar, albuterol.  PFTs reviewed with no overt obstruction.  There is however mild air  trapping and increased diffusion capacity consistent with asthma  Plan/Recommendations: - Continue Qvar, albuterol.  Marshell Garfinkel MD [facility] 03/14/2018, 4:36 PM  CC: Harrison Mons, PA-C

## 2018-03-14 NOTE — Progress Notes (Signed)
PFT completed 03/14/18

## 2018-03-14 NOTE — Patient Instructions (Signed)
Glad that your breathing is better Your lung function tests look normal.  There are very minimal changes suggestive of asthma Your blood tests show allergies to dust mite and cockroach.  Please make sure that there is no exposure to these at home Continue the Qvar and albuterol as needed Follow-up in 6 months.

## 2018-06-04 ENCOUNTER — Other Ambulatory Visit: Payer: Self-pay | Admitting: Urgent Care

## 2018-06-04 DIAGNOSIS — E282 Polycystic ovarian syndrome: Secondary | ICD-10-CM

## 2018-06-04 DIAGNOSIS — K219 Gastro-esophageal reflux disease without esophagitis: Secondary | ICD-10-CM

## 2018-06-06 ENCOUNTER — Other Ambulatory Visit: Payer: Self-pay | Admitting: Urgent Care

## 2018-06-06 DIAGNOSIS — E282 Polycystic ovarian syndrome: Secondary | ICD-10-CM

## 2018-06-15 ENCOUNTER — Encounter (INDEPENDENT_AMBULATORY_CARE_PROVIDER_SITE_OTHER): Payer: 59

## 2018-06-20 ENCOUNTER — Encounter (INDEPENDENT_AMBULATORY_CARE_PROVIDER_SITE_OTHER): Payer: 59

## 2018-06-28 ENCOUNTER — Encounter (INDEPENDENT_AMBULATORY_CARE_PROVIDER_SITE_OTHER): Payer: Self-pay | Admitting: Bariatrics

## 2018-06-28 ENCOUNTER — Ambulatory Visit (INDEPENDENT_AMBULATORY_CARE_PROVIDER_SITE_OTHER): Payer: 59 | Admitting: Bariatrics

## 2018-06-28 ENCOUNTER — Ambulatory Visit (INDEPENDENT_AMBULATORY_CARE_PROVIDER_SITE_OTHER): Payer: 59 | Admitting: Family Medicine

## 2018-06-28 VITALS — BP 111/80 | HR 87 | Temp 97.7°F | Ht 65.0 in | Wt 240.0 lb

## 2018-06-28 DIAGNOSIS — R0602 Shortness of breath: Secondary | ICD-10-CM

## 2018-06-28 DIAGNOSIS — E038 Other specified hypothyroidism: Secondary | ICD-10-CM

## 2018-06-28 DIAGNOSIS — Z9189 Other specified personal risk factors, not elsewhere classified: Secondary | ICD-10-CM

## 2018-06-28 DIAGNOSIS — Z1331 Encounter for screening for depression: Secondary | ICD-10-CM

## 2018-06-28 DIAGNOSIS — Z8659 Personal history of other mental and behavioral disorders: Secondary | ICD-10-CM

## 2018-06-28 DIAGNOSIS — Z0289 Encounter for other administrative examinations: Secondary | ICD-10-CM

## 2018-06-28 DIAGNOSIS — Z6839 Body mass index (BMI) 39.0-39.9, adult: Secondary | ICD-10-CM

## 2018-06-28 DIAGNOSIS — E282 Polycystic ovarian syndrome: Secondary | ICD-10-CM

## 2018-06-28 DIAGNOSIS — R5383 Other fatigue: Secondary | ICD-10-CM

## 2018-06-29 LAB — COMPREHENSIVE METABOLIC PANEL
ALBUMIN: 4.4 g/dL (ref 3.5–5.5)
ALT: 28 IU/L (ref 0–32)
AST: 16 IU/L (ref 0–40)
Albumin/Globulin Ratio: 1.5 (ref 1.2–2.2)
Alkaline Phosphatase: 100 IU/L (ref 39–117)
BILIRUBIN TOTAL: 0.2 mg/dL (ref 0.0–1.2)
BUN/Creatinine Ratio: 23 (ref 9–23)
BUN: 18 mg/dL (ref 6–24)
CO2: 22 mmol/L (ref 20–29)
CREATININE: 0.78 mg/dL (ref 0.57–1.00)
Calcium: 9.9 mg/dL (ref 8.7–10.2)
Chloride: 100 mmol/L (ref 96–106)
GFR, EST AFRICAN AMERICAN: 107 mL/min/{1.73_m2} (ref >59)
GFR, EST NON AFRICAN AMERICAN: 93 mL/min/{1.73_m2} (ref >59)
GLUCOSE: 86 mg/dL (ref 65–99)
Globulin, Total: 3 g/dL (ref 1.5–4.5)
Potassium: 4.6 mmol/L (ref 3.5–5.2)
Sodium: 134 mmol/L (ref 134–144)
TOTAL PROTEIN: 7.4 g/dL (ref 6.0–8.5)

## 2018-06-29 LAB — CBC WITH DIFFERENTIAL
BASOS ABS: 0.1 10*3/uL (ref 0.0–0.2)
Basos: 1 %
EOS (ABSOLUTE): 0.3 10*3/uL (ref 0.0–0.4)
Eos: 3 %
HEMOGLOBIN: 14.8 g/dL (ref 11.1–15.9)
Hematocrit: 43.8 % (ref 34.0–46.6)
IMMATURE GRANS (ABS): 0 10*3/uL (ref 0.0–0.1)
IMMATURE GRANULOCYTES: 0 %
LYMPHS: 16 %
Lymphocytes Absolute: 1.9 10*3/uL (ref 0.7–3.1)
MCH: 30.5 pg (ref 26.6–33.0)
MCHC: 33.8 g/dL (ref 31.5–35.7)
MCV: 90 fL (ref 79–97)
MONOCYTES: 6 %
Monocytes Absolute: 0.7 10*3/uL (ref 0.1–0.9)
NEUTROS ABS: 8.7 10*3/uL — AB (ref 1.4–7.0)
Neutrophils: 74 %
RBC: 4.85 x10E6/uL (ref 3.77–5.28)
RDW: 13.2 % (ref 12.3–15.4)
WBC: 11.6 10*3/uL — AB (ref 3.4–10.8)

## 2018-06-29 LAB — TSH: TSH: 1.71 u[IU]/mL (ref 0.450–4.500)

## 2018-06-29 LAB — INSULIN, RANDOM: INSULIN: 14.2 u[IU]/mL (ref 2.6–24.9)

## 2018-06-29 LAB — T3: T3, Total: 128 ng/dL (ref 71–180)

## 2018-06-29 LAB — FOLATE: Folate: 11.3 ng/mL (ref >3.0)

## 2018-06-29 LAB — HEMOGLOBIN A1C
ESTIMATED AVERAGE GLUCOSE: 103 mg/dL
HEMOGLOBIN A1C: 5.2 % (ref 4.8–5.6)

## 2018-06-29 LAB — LIPID PANEL WITH LDL/HDL RATIO
CHOLESTEROL TOTAL: 181 mg/dL (ref 100–199)
HDL: 54 mg/dL (ref >39)
LDL Calculated: 96 mg/dL (ref 0–99)
LDl/HDL Ratio: 1.8 ratio (ref 0.0–3.2)
TRIGLYCERIDES: 154 mg/dL — AB (ref 0–149)
VLDL Cholesterol Cal: 31 mg/dL (ref 5–40)

## 2018-06-29 LAB — VITAMIN B12: VITAMIN B 12: 723 pg/mL (ref 232–1245)

## 2018-06-29 LAB — VITAMIN D 25 HYDROXY (VIT D DEFICIENCY, FRACTURES): VIT D 25 HYDROXY: 47.5 ng/mL (ref 30.0–100.0)

## 2018-06-29 LAB — T4, FREE: Free T4: 1.67 ng/dL (ref 0.82–1.77)

## 2018-07-04 DIAGNOSIS — E66812 Obesity, class 2: Secondary | ICD-10-CM | POA: Insufficient documentation

## 2018-07-04 DIAGNOSIS — Z6839 Body mass index (BMI) 39.0-39.9, adult: Secondary | ICD-10-CM

## 2018-07-04 NOTE — Progress Notes (Signed)
.  Office: [phone]  /  Fax: [phone]   HPI:   Chief Complaint: OBESITY  Erin Warren (MR# [id number]) is a 42 y.o. female who presents on 07/04/2018 for obesity evaluation and treatment. Current BMI is Body mass index is 39.94 kg/m.Erin Warren has struggled with obesity for years and has been unsuccessful in either losing weight or maintaining long term weight loss. Erin Warren craves salty and spicy foods and her partner craves junk food which has the potential for sabotage. Erin Warren has multiple food dislikes and she has food allergies to wheat, alias eggplant and raw celery. Erin Warren attended our information session and states she is currently in the action stage of change and ready to dedicate time achieving and maintaining a healthier weight.  Erin Warren states her family eats meals together she thinks her family will eat healthier with  her she struggles with family and or coworkers weight loss sabotage her desired weight loss is 94 lbs she has been heavy most of  her life her heaviest weight ever was 265 lbs. she is a picky eater and doesn't like to eat healthier foods  she has significant food cravings issues she snacks frequently in the evenings she frequently makes poor food choices she has problems with excessive hunger  she has binge eating behaviors she struggles with emotional eating    Fatigue Erin Warren feels her energy is lower than it should be. This has worsened with weight gain and has not worsened recently. Erin Warren admits to daytime somnolence and admits to waking up still tired. Patient had a sleep study years ago. Patent has a history of symptoms of daytime fatigue and morning fatigue. Patient generally gets 6 or 7 hours of sleep per night, and states she has problems staying asleep. Snoring is not present. Apneic episodes are not present. Epworth Sleepiness Score is 3  Dyspnea on exertion Erin Warren notes increasing shortness of breath with exercising and seems to be worsening over time  with weight gain. She notes getting out of breath sooner with activity than she used to. This has not gotten worse recently. Erin Warren denies orthopnea.  PCOS (polycystic ovarian syndrome) Erin Warren was diagnosed with polycystic ovarian syndrome in 2004 approximately where an ultrasound was done. She has been taking metformin for ten years.  Hypothyroidism Erin Warren has a diagnosis of hypothyroidism. [name] is on levothyroxine and she has been well controlled. She denies temperature instability, but does admit to decreased energy.  History of Anorexia (during high school) Erin Warren has a history of possible anorexia. She was never dramatically under weight, but she has been obsessive in the past.  Depression Screen Erin Warren's Food and Mood (modified PHQ-9) score was  Depression screen PHQ 2/9 06/28/2018  Decreased Interest 1  Down, Depressed, Hopeless 1  PHQ - 2 Score 2  Altered sleeping 0  Tired, decreased energy 1  Change in appetite 0  Feeling bad or failure about yourself  1  Trouble concentrating 0  Suicidal thoughts 0  PHQ-9 Score 4  Difficult doing work/chores Not difficult at all    ALLERGIES: Allergies  Allergen Reactions  . Food Hives, Nausea Only and Swelling    WHEAT  . Garlic   . Onion   . Topamax [Topiramate] Other (See Comments)    Flushing, paresthesias (tingling, burning).      MEDICATIONS: Current Outpatient Medications on File Prior to Visit  Medication Sig Dispense Refill  . albuterol (PROVENTIL HFA;VENTOLIN HFA) 108 (90 BASE) MCG/ACT inhaler Inhale 2 puffs into the lungs every 6 (  six) hours as needed. 1 Inhaler 3  . benzonatate (TESSALON) 200 MG capsule Take 1 capsule (200 mg total) by mouth 2 (two) times daily as needed for cough. 20 capsule 0  . Cholecalciferol (VITAMIN D) 2000 UNITS tablet Take 2,000 Units by mouth daily.    Erin Kitchen EPIPEN 2-PAK 0.3 MG/0.3ML SOAJ injection as needed.    . fluticasone (FLONASE) 50 MCG/ACT nasal spray SPRAY 2 SPRAYS INTO EACH NOSTRIL EVERY  DAY  2  . fluticasone (FLONASE) 50 MCG/ACT nasal spray SPRAY 2 SPRAYS INTO EACH NOSTRIL EVERY DAY 16 g 2  . levothyroxine (SYNTHROID, LEVOTHROID) 25 MCG tablet Take 1 tablet (25 mcg total) by mouth daily. 90 tablet 3  . loratadine (CLARITIN) 10 MG tablet Take 10 mg by mouth daily.    . metFORMIN (GLUCOPHAGE) 500 MG tablet TAKE 1 TABLET BY MOUTH TWICE A DAY 60 tablet 0  . propranolol ER (INDERAL LA) 80 MG 24 hr capsule Take 1 capsule (80 mg total) by mouth daily. 90 capsule 4  . QVAR REDIHALER 80 MCG/ACT inhaler TAKE 1 PUFF BY MOUTH TWICE A DAY  5  . ranitidine (ZANTAC) 150 MG tablet Take 1 tablet (150 mg total) by mouth 2 (two) times daily. No more refills without office visit 180 tablet 0  . rizatriptan (MAXALT-MLT) 10 MG disintegrating tablet Take 1 tablet (10 mg total) by mouth as needed for migraine. May repeat in 2 hours if needed 9 tablet 12   No current facility-administered medications on file prior to visit.     PAST MEDICAL HISTORY: Past Medical History:  Diagnosis Date  . AC (acromioclavicular) joint bone spurs   . Allergy   . Asthma   . Bunion   . Edema, lower extremity   . GERD (gastroesophageal reflux disease)   . Heel spur    Both heels, one has fractured off  . Hypertension   . Hypothyroidism   . Migraines   . Multiple food allergies   . Polycystic ovarian syndrome   . Seasonal allergies   . Thyroid disease   . Ulnar nerve abnormality     PAST SURGICAL HISTORY: Past Surgical History:  Procedure Laterality Date  . OOPHORECTOMY Left   . PLANTAR FASCIECTOMY Bilateral 08/2014  . TERATOMA EXCISION Left   . TUBAL LIGATION  2008    SOCIAL HISTORY: Social History   Tobacco Use  . Smoking status: Never Smoker  . Smokeless tobacco: Never Used  Substance Use Topics  . Alcohol use: Yes    Alcohol/week: 1.0 standard drinks    Types: 1 Glasses of wine per week    Comment: 1-4/month  . Drug use: No    FAMILY HISTORY: Family History  Problem Relation Age of  Onset  . Thyroid disease Mother   . Allergies Mother   . Hypertension Father   . Heart disease Father   . Cancer Father        skin  . Sleep apnea Father   . Obesity Father   . Alcohol abuse Maternal Grandfather   . Hypertension Paternal Grandmother   . Hypertension Paternal Grandfather   . Diabetes Paternal Grandfather   . Hypertension Brother   . Heart Problems Brother     ROS: Review of Systems  Constitutional: Positive for malaise/fatigue.  HENT: Positive for congestion (nasal stuffiness).        + Nasal discharge + Hay Fever + Dry Mouth  Eyes:       + Wear Glasses or Contacts + Floaters  Respiratory: Positive for shortness of breath (with activity).   Cardiovascular: Negative for orthopnea.  Gastrointestinal: Positive for heartburn.  Endo/Heme/Allergies:       Negative for heat or cold intolerance  Psychiatric/Behavioral: The patient has insomnia.        + Stress    PHYSICAL EXAM: Blood pressure 111/80, pulse 87, temperature 97.7 F (36.5 C), temperature source Other (Comment), height 5\' 5"  (1.651 m), weight 240 lb (108.9 kg), last menstrual period 06/22/2018, SpO2 96 %. Body mass index is 39.94 kg/m. Physical Exam  Constitutional: She is oriented to person, place, and time. She appears well-developed and well-nourished.  HENT:  Head: Normocephalic and atraumatic.  Nose: Nose normal.  Mallanpati = 3  Eyes: EOM are normal. No scleral icterus.  Neck: Normal range of motion. Neck supple. No thyromegaly present.  Cardiovascular: Normal rate and regular rhythm.  Pulmonary/Chest: Effort normal. No respiratory distress.  Abdominal: Soft. There is no tenderness.  + Obesity  Musculoskeletal: Normal range of motion. She exhibits edema (trace edema noted in hand).  Range of Motion normal in all 4 extremities  Neurological: She is alert and oriented to person, place, and time. Coordination normal.  Skin: Skin is warm and dry.  Psychiatric: She has a normal mood and  affect.  Vitals reviewed.   RECENT LABS AND TESTS: BMET    Component Value Date/Time   NA 134 06/28/2018 1037   K 4.6 06/28/2018 1037   CL 100 06/28/2018 1037   CO2 22 06/28/2018 1037   GLUCOSE 86 06/28/2018 1037   GLUCOSE 80 02/10/2016 1735   BUN 18 06/28/2018 1037   CREATININE 0.78 06/28/2018 1037   CREATININE 0.67 02/10/2016 1735   CALCIUM 9.9 06/28/2018 1037   GFRNONAA 93 06/28/2018 1037   GFRAA 107 06/28/2018 1037   Lab Results  Component Value Date   HGBA1C 5.2 06/28/2018   Lab Results  Component Value Date   INSULIN 14.2 06/28/2018   CBC    Component Value Date/Time   WBC 11.6 (H) 06/28/2018 1037   WBC 11.6 (H) 02/03/2018 1620   RBC 4.85 06/28/2018 1037   RBC 4.72 02/03/2018 1620   HGB 14.8 06/28/2018 1037   HCT 43.8 06/28/2018 1037   PLT 375.0 02/03/2018 1620   PLT 435 (H) 10/18/2017 1640   MCV 90 06/28/2018 1037   MCH 30.5 06/28/2018 1037   MCH 31.3 02/04/2015 1109   MCHC 33.8 06/28/2018 1037   MCHC 34.1 02/03/2018 1620   RDW 13.2 06/28/2018 1037   LYMPHSABS 1.9 06/28/2018 1037   MONOABS 0.7 02/03/2018 1620   EOSABS 0.3 06/28/2018 1037   BASOSABS 0.1 06/28/2018 1037   Iron/TIBC/Ferritin/ %Sat No results found for: IRON, TIBC, FERRITIN, IRONPCTSAT Lipid Panel     Component Value Date/Time   CHOL 181 06/28/2018 1037   TRIG 154 (H) 06/28/2018 1037   HDL 54 06/28/2018 1037   CHOLHDL 3.1 10/18/2017 1640   CHOLHDL 2.7 02/04/2015 1109   VLDL 23 02/04/2015 1109   LDLCALC 96 06/28/2018 1037   Hepatic Function Panel     Component Value Date/Time   PROT 7.4 06/28/2018 1037   ALBUMIN 4.4 06/28/2018 1037   AST 16 06/28/2018 1037   ALT 28 06/28/2018 1037   ALKPHOS 100 06/28/2018 1037   BILITOT 0.2 06/28/2018 1037      Component Value Date/Time   TSH 1.710 06/28/2018 1037   Vitamin D Results for BIANCE, MONCRIEF (MRN [id number]) as of 07/04/2018 07:26  Ref. Range 10/18/2017 16:40  Vitamin D, 25-Hydroxy Latest Ref Range: 30.0 - 100.0 ng/mL 44.3     ECG  shows NSR with a rate of 89 BPM INDIRECT CALORIMETER done today shows a VO2 of 222 and a REE of 1548. Her calculated basal metabolic rate is 9563 thus her basal metabolic rate is worse than expected.    ASSESSMENT AND PLAN: Other fatigue - Plan: EKG 12-Lead, Vitamin B12, CBC With Differential, Folate, VITAMIN D 25 Hydroxy (Vit-D Deficiency, Fractures)  Shortness of breath on exertion - Plan: Lipid Panel With LDL/HDL Ratio  PCOS (polycystic ovarian syndrome) - Plan: Comprehensive metabolic panel, Hemoglobin A1c, Insulin, random  Other specified hypothyroidism - Plan: T3, T4, free, TSH  History of anorexia nervosa  Depression screening  At risk for heart disease  Class 2 severe obesity with serious comorbidity and body mass index (BMI) of 39.0 to 39.9 in adult, unspecified obesity type (HCC)  PLAN:  Fatigue Ashiyah was informed that her fatigue may be related to obesity, depression or many other causes. Labs will be ordered, and in the meanwhile Armani has agreed to work on diet, exercise and weight loss to help with fatigue. Proper sleep hygiene was discussed including the need for 7-8 hours of quality sleep each night. We may consider sleep study in the future.   Dyspnea on exertion Nitika's shortness of breath appears to be obesity related and exercise induced. She has agreed to work on weight loss and slowly increase activity to treat her exercise induced shortness of breath. If Florence follows our instructions and loses weight without improvement of her shortness of breath, we will plan to refer to pulmonology. We will monitor this condition regularly. Dayonna agrees to this plan.  PCOS (polycystic ovarian syndrome) Theola will continue to take metformin and she will start to work on diet and weight loss. Jonie will follow up with our clinic in 2 weeks.  Hypothyroidism Eimi was informed of the importance of good thyroid control to help with weight loss efforts. She was also  informed that supertheraputic thyroid levels are dangerous and will not improve weight loss results. Adlean will continue levothyroxine and will follow up with her PCP.  History of Anorexia (during high school) We will watch for obsessive behaviors and will discuss as needed. We will refer to Dr. Mallie Mussel, our bariatric psychologist in the future if needed.  Depression Screen Lylee had a negative depression screening. Depression is commonly associated with obesity and often results in emotional eating behaviors. We will monitor this closely and work on CBT to help improve the non-hunger eating patterns.   Obesity Shavaughn is currently in the action stage of change and her goal is to continue with weight loss efforts She has agreed to follow the Category 2 plan modified for wheat allergy Mita has been instructed to work up to a goal of 150 minutes of combined cardio and strengthening exercise per week for weight loss and overall health benefits. We discussed the following Behavioral Modification Strategies today: work on decreasing cravings, increase H2O intake, increasing lean protein intake, decreasing simple carbohydrates , increasing vegetables and work on meal planning and easy cooking plans  Navina has agreed to follow up with our clinic in 2 weeks. She was informed of the importance of frequent follow up visits to maximize her success with intensive lifestyle modifications for her multiple health conditions. She was informed we would discuss her lab results at her next visit unless there is a critical issue that needs to be addressed sooner. Manuela Schwartz  agreed to keep her next visit at the agreed upon time to discuss these results.    OBESITY BEHAVIORAL INTERVENTION VISIT  Today's visit was # 1   Starting weight: 240 lbs Starting date: 06/28/18 Today's weight : 240 lbs  Today's date: 06/28/2018 Total lbs lost to date: 0   ASK: We discussed the diagnosis of obesity with [name] today  and Letasha agreed to give [name] permission to discuss obesity behavioral modification therapy today.  ASSESS: Jesslyn has the diagnosis of obesity and her BMI today is 39.94 Shakthi is in the action stage of change   ADVISE: Lovelyn was educated on the multiple health risks of obesity as well as the benefit of weight loss to improve her health. She was advised of the need for long term treatment and the importance of lifestyle modifications to improve her current health and to decrease her risk of future health problems.  AGREE: Multiple dietary modification options and treatment options were discussed and  Takerra agreed to follow the recommendations documented in the above note.  ARRANGE: Normalee was educated on the importance of frequent visits to treat obesity as outlined per CMS and USPSTF guidelines and agreed to schedule her next follow up appointment today.   Corey Skains, am acting as Location manager for [employer]. Owens Shark, DO  I have reviewed the above documentation for accuracy and completeness, and I agree with the above. -Jearld Lesch, DO

## 2018-07-12 ENCOUNTER — Encounter (INDEPENDENT_AMBULATORY_CARE_PROVIDER_SITE_OTHER): Payer: Self-pay | Admitting: Bariatrics

## 2018-07-12 ENCOUNTER — Ambulatory Visit (INDEPENDENT_AMBULATORY_CARE_PROVIDER_SITE_OTHER): Payer: 59 | Admitting: Bariatrics

## 2018-07-12 VITALS — BP 132/89 | HR 86 | Temp 97.5°F | Ht 65.0 in | Wt 241.0 lb

## 2018-07-12 DIAGNOSIS — Z6841 Body Mass Index (BMI) 40.0 and over, adult: Secondary | ICD-10-CM

## 2018-07-12 DIAGNOSIS — E8881 Metabolic syndrome: Secondary | ICD-10-CM

## 2018-07-13 NOTE — Progress Notes (Signed)
Office: [phone]  /  Fax: [phone]   HPI:   Chief Complaint: OBESITY Erin Warren is here to discuss her progress with her obesity treatment plan. She is on the Category 2 plan modified for wheat allergy and is following her eating plan approximately 80 % of the time. She states she is exercising 0 minutes 0 times per week. Erin Warren has been following the plan monthly. She has had problems with getting in enough meat.  Her weight is 241 lb (109.3 kg) today and has gained 1 pound since her last visit. She has lost 0 lbs since starting treatment with [name].  Insulin Resistance Erin Warren has a diagnosis of insulin resistance based on her elevated fasting insulin level >5. Last A1c was 5.2 and insulin was 14.2. Although Erin Warren's blood glucose readings are still under good control, insulin resistance puts her at greater risk of metabolic syndrome and diabetes. She has a history of polycystic ovarian syndrome. She is taking metformin currently, she notes polyphagia in the morning and continues to work on diet and exercise to decrease risk of diabetes.  ALLERGIES: Allergies  Allergen Reactions  . Food Hives, Nausea Only and Swelling    WHEAT  . Garlic   . Onion   . Topamax [Topiramate] Other (See Comments)    Flushing, paresthesias (tingling, burning).      MEDICATIONS: Current Outpatient Medications on File Prior to Visit  Medication Sig Dispense Refill  . albuterol (PROVENTIL HFA;VENTOLIN HFA) 108 (90 BASE) MCG/ACT inhaler Inhale 2 puffs into the lungs every 6 (six) hours as needed. 1 Inhaler 3  . benzonatate (TESSALON) 200 MG capsule Take 1 capsule (200 mg total) by mouth 2 (two) times daily as needed for cough. 20 capsule 0  . Cholecalciferol (VITAMIN D) 2000 UNITS tablet Take 2,000 Units by mouth daily.    [name] EPIPEN 2-PAK 0.3 MG/0.3ML SOAJ injection as needed.    . fluticasone (FLONASE) 50 MCG/ACT nasal spray SPRAY 2 SPRAYS INTO EACH NOSTRIL EVERY DAY  2  . fluticasone (FLONASE) 50 MCG/ACT  nasal spray SPRAY 2 SPRAYS INTO EACH NOSTRIL EVERY DAY 16 g 2  . levothyroxine (SYNTHROID, LEVOTHROID) 25 MCG tablet Take 1 tablet (25 mcg total) by mouth daily. 90 tablet 3  . loratadine (CLARITIN) 10 MG tablet Take 10 mg by mouth daily.    . metFORMIN (GLUCOPHAGE) 500 MG tablet TAKE 1 TABLET BY MOUTH TWICE A DAY 60 tablet 0  . propranolol ER (INDERAL LA) 80 MG 24 hr capsule Take 1 capsule (80 mg total) by mouth daily. 90 capsule 4  . QVAR REDIHALER 80 MCG/ACT inhaler TAKE 1 PUFF BY MOUTH TWICE A DAY  5  . ranitidine (ZANTAC) 150 MG tablet Take 1 tablet (150 mg total) by mouth 2 (two) times daily. No more refills without office visit 180 tablet 0  . rizatriptan (MAXALT-MLT) 10 MG disintegrating tablet Take 1 tablet (10 mg total) by mouth as needed for migraine. May repeat in 2 hours if needed 9 tablet 12   No current facility-administered medications on file prior to visit.     PAST MEDICAL HISTORY: Past Medical History:  Diagnosis Date  . AC (acromioclavicular) joint bone spurs   . Allergy   . Asthma   . Bunion   . Edema, lower extremity   . GERD (gastroesophageal reflux disease)   . Heel spur    Both heels, one has fractured off  . Hypertension   . Hypothyroidism   . Migraines   . Multiple food  allergies   . Polycystic ovarian syndrome   . Seasonal allergies   . Thyroid disease   . Ulnar nerve abnormality     PAST SURGICAL HISTORY: Past Surgical History:  Procedure Laterality Date  . OOPHORECTOMY Left   . PLANTAR FASCIECTOMY Bilateral 08/2014  . TERATOMA EXCISION Left   . TUBAL LIGATION  2008    SOCIAL HISTORY: Social History   Tobacco Use  . Smoking status: Never Smoker  . Smokeless tobacco: Never Used  Substance Use Topics  . Alcohol use: Yes    Alcohol/week: 1.0 standard drinks    Types: 1 Glasses of wine per week    Comment: 1-4/month  . Drug use: No    FAMILY HISTORY: Family History  Problem Relation Age of Onset  . Thyroid disease Mother   .  Allergies Mother   . Hypertension Father   . Heart disease Father   . Cancer Father        skin  . Sleep apnea Father   . Obesity Father   . Alcohol abuse Maternal Grandfather   . Hypertension Paternal Grandmother   . Hypertension Paternal Grandfather   . Diabetes Paternal Grandfather   . Hypertension Brother   . Heart Problems Brother     ROS: Review of Systems  Constitutional: Negative for weight loss.  Endo/Heme/Allergies:       Positive polyphagia    PHYSICAL EXAM: Pulse 86, temperature (!) 97.5 F (36.4 C), temperature source Oral, height 5\' 5"  (1.651 m), weight 241 lb (109.3 kg), last menstrual period 06/22/2018, SpO2 96 %. Body mass index is 40.1 kg/m. Physical Exam  Constitutional: She is oriented to person, place, and time. She appears well-developed and well-nourished.  Cardiovascular: Normal rate.  Pulmonary/Chest: Effort normal.  Musculoskeletal: Normal range of motion.  Neurological: She is oriented to person, place, and time.  Skin: Skin is warm and dry.  Psychiatric: She has a normal mood and affect. Her behavior is normal.  Vitals reviewed.   RECENT LABS AND TESTS: BMET    Component Value Date/Time   NA 134 06/28/2018 1037   K 4.6 06/28/2018 1037   CL 100 06/28/2018 1037   CO2 22 06/28/2018 1037   GLUCOSE 86 06/28/2018 1037   GLUCOSE 80 02/10/2016 1735   BUN 18 06/28/2018 1037   CREATININE 0.78 06/28/2018 1037   CREATININE 0.67 02/10/2016 1735   CALCIUM 9.9 06/28/2018 1037   GFRNONAA 93 06/28/2018 1037   GFRAA 107 06/28/2018 1037   Lab Results  Component Value Date   HGBA1C 5.2 06/28/2018   HGBA1C 4.7 09/27/2013   Lab Results  Component Value Date   INSULIN 14.2 06/28/2018   CBC    Component Value Date/Time   WBC 11.6 (H) 06/28/2018 1037   WBC 11.6 (H) 02/03/2018 1620   RBC 4.85 06/28/2018 1037   RBC 4.72 02/03/2018 1620   HGB 14.8 06/28/2018 1037   HCT 43.8 06/28/2018 1037   PLT 375.0 02/03/2018 1620   PLT 435 (H) 10/18/2017  1640   MCV 90 06/28/2018 1037   MCH 30.5 06/28/2018 1037   MCH 31.3 02/04/2015 1109   MCHC 33.8 06/28/2018 1037   MCHC 34.1 02/03/2018 1620   RDW 13.2 06/28/2018 1037   LYMPHSABS 1.9 06/28/2018 1037   MONOABS 0.7 02/03/2018 1620   EOSABS 0.3 06/28/2018 1037   BASOSABS 0.1 06/28/2018 1037   Iron/TIBC/Ferritin/ %Sat No results found for: IRON, TIBC, FERRITIN, IRONPCTSAT Lipid Panel     Component Value Date/Time  CHOL 181 06/28/2018 1037   TRIG 154 (H) 06/28/2018 1037   HDL 54 06/28/2018 1037   CHOLHDL 3.1 10/18/2017 1640   CHOLHDL 2.7 02/04/2015 1109   VLDL 23 02/04/2015 1109   LDLCALC 96 06/28/2018 1037   Hepatic Function Panel     Component Value Date/Time   PROT 7.4 06/28/2018 1037   ALBUMIN 4.4 06/28/2018 1037   AST 16 06/28/2018 1037   ALT 28 06/28/2018 1037   ALKPHOS 100 06/28/2018 1037   BILITOT 0.2 06/28/2018 1037      Component Value Date/Time   TSH 1.710 06/28/2018 1037   TSH 2.930 10/18/2017 1640   TSH 3.010 08/17/2016 1650    ASSESSMENT AND PLAN: Insulin resistance  Class 3 severe obesity with serious comorbidity and body mass index (BMI) of 40.0 to 44.9 in adult, unspecified obesity type (HCC)  PLAN:  Insulin Resistance Daleiza will continue to work on weight loss, exercise, and decreasing simple carbohydrates in her diet to help decrease the risk of diabetes. Myself and our registered dietitian dicussed insulin resistance, and metformin including benefits and risks. She was informed that eating too many simple carbohydrates or too many calories at one sitting increases the likelihood of GI side effects. Kiannah agrees to continue taking metformin and she agrees to follow up with our clinic in 2 weeks as directed to monitor her progress.  I spent > than 50% of the 15 minute visit on counseling as documented in the note.  Obesity Kyleigh is currently in the action stage of change. As such, her goal is to continue with weight loss efforts She has agreed to  follow the Category 2 plan modified for wheat allegery Emmalia has been instructed to work up to a goal of 150 minutes of combined cardio and strengthening exercise per week for weight loss and overall health benefits. We discussed the following Behavioral Modification Strategies today: increasing lean protein intake, decreasing simple carbohydrates, increasing vegetables, and planning for success Tamanna is to continue the plan, decrease fruit, and carbohydrates overall.  Loribeth has agreed to follow up with our clinic in 2 weeks. She was informed of the importance of frequent follow up visits to maximize her success with intensive lifestyle modifications for her multiple health conditions.   OBESITY BEHAVIORAL INTERVENTION VISIT  Today's visit was # 2   Starting weight: 240 lbs Starting date: 06/28/18 Today's weight : 241 lbs  Today's date: 07/12/2018 Total lbs lost to date: 0    ASK: We discussed the diagnosis of obesity with [name] today and Annastacia agreed to give [name] permission to discuss obesity behavioral modification therapy today.  ASSESS: Lucresha has the diagnosis of obesity and her BMI today is 40.1 Chitara is in the action stage of change   ADVISE: Rilyn was educated on the multiple health risks of obesity as well as the benefit of weight loss to improve her health. She was advised of the need for long term treatment and the importance of lifestyle modifications to improve her current health and to decrease her risk of future health problems.  AGREE: Multiple dietary modification options and treatment options were discussed and  Latrece agreed to follow the recommendations documented in the above note.  ARRANGE: Yetta was educated on the importance of frequent visits to treat obesity as outlined per CMS and USPSTF guidelines and agreed to schedule her next follow up appointment today.  Wilhemena Durie, am acting as transcriptionist for [employer], DO  I have reviewed the  above documentation for accuracy and completeness, and I agree with the above. -Jearld Lesch, DO

## 2018-07-17 DIAGNOSIS — E8881 Metabolic syndrome: Secondary | ICD-10-CM | POA: Insufficient documentation

## 2018-07-26 ENCOUNTER — Ambulatory Visit (INDEPENDENT_AMBULATORY_CARE_PROVIDER_SITE_OTHER): Payer: 59 | Admitting: Bariatrics

## 2018-07-26 ENCOUNTER — Encounter (INDEPENDENT_AMBULATORY_CARE_PROVIDER_SITE_OTHER): Payer: Self-pay | Admitting: Bariatrics

## 2018-07-26 VITALS — BP 130/80 | HR 94 | Temp 97.9°F | Ht 65.0 in | Wt 238.0 lb

## 2018-07-26 DIAGNOSIS — Z6839 Body mass index (BMI) 39.0-39.9, adult: Secondary | ICD-10-CM

## 2018-07-26 DIAGNOSIS — E8881 Metabolic syndrome: Secondary | ICD-10-CM | POA: Diagnosis not present
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
## 2018-08-10 ENCOUNTER — Telehealth: Payer: Self-pay

## 2018-08-10 NOTE — Telephone Encounter (Signed)
In error

## 2018-08-10 NOTE — Telephone Encounter (Signed)
Received notice that patient needed a1c and dm eye exam. Record shows that pt did move to new location of [facility]. A1c was 1 month ago. No current dm eye exam.

## 2018-08-15 ENCOUNTER — Ambulatory Visit (INDEPENDENT_AMBULATORY_CARE_PROVIDER_SITE_OTHER): Payer: 59 | Admitting: Bariatrics

## 2018-08-15 ENCOUNTER — Encounter (INDEPENDENT_AMBULATORY_CARE_PROVIDER_SITE_OTHER): Payer: Self-pay | Admitting: Bariatrics

## 2018-08-15 VITALS — BP 140/88 | HR 95 | Temp 97.7°F | Ht 65.0 in | Wt 237.0 lb

## 2018-08-15 DIAGNOSIS — K219 Gastro-esophageal reflux disease without esophagitis: Secondary | ICD-10-CM | POA: Diagnosis not present

## 2018-08-15 DIAGNOSIS — I10 Essential (primary) hypertension: Secondary | ICD-10-CM

## 2018-08-15 DIAGNOSIS — Z6839 Body mass index (BMI) 39.0-39.9, adult: Secondary | ICD-10-CM | POA: Diagnosis not present

## 2018-08-15 DIAGNOSIS — E8881 Metabolic syndrome: Secondary | ICD-10-CM | POA: Diagnosis not present

## 2018-08-15 DIAGNOSIS — Z9189 Other specified personal risk factors, not elsewhere classified: Secondary | ICD-10-CM

## 2018-08-16 DIAGNOSIS — Z9189 Other specified personal risk factors, not elsewhere classified: Secondary | ICD-10-CM | POA: Insufficient documentation

## 2018-08-16 NOTE — Progress Notes (Signed)
Office: [phone]  /  Fax: [phone]   HPI:   Chief Complaint: OBESITY Erin Warren is here to discuss her progress with her obesity treatment plan. She is on the Category 2 plan and is following her eating plan approximately 50 % of the time. She states she is exercising 0 minutes 0 times per week. Erin Warren did struggle slightly with breakfast. She has been eating more. Her hunger is reasonably controlled. Her weight is 237 lb (107.5 kg) today and has had a weight loss of 1 pounds over a period of 2 to 3 weeks since her last visit. She has lost 3 lbs since starting treatment with [name].  Insulin Resistance Erin Warren has a diagnosis of insulin resistance based on her elevated fasting insulin level >5. Although Erin Warren's blood glucose readings are still under good control, insulin resistance puts her at greater risk of metabolic syndrome and diabetes. She is taking metformin two times daily and she continues to work on diet and exercise to decrease risk of diabetes. Erin Warren denies hypoglycemia.  At risk for diabetes Erin Warren is at higher than average risk for developing diabetes due to her obesity and insulin resistance. She currently denies polyuria or polydipsia.  Hypertension Erin Warren is a 42 y.o. female with hypertension. She is taking propranolol ER (states for migraines) and she is not on blood pressure medications. Her blood pressure is 140/88 today and has been controlled in the past. Erin Warren denies chest pain or shortness of breath on exertion. She is working weight loss to help control her blood pressure with the goal of decreasing her risk of heart attack and stroke. Erin Warren blood pressure is not well controlled.  GERD (gastroesophageal reflux disease) Erin Warren is currently taking ranitidine and she reports GERD symptoms 1 to 2 times per week. She denies nausea, vomiting or reflux.  ALLERGIES: Allergies  Allergen Reactions  . Food Hives, Nausea Only and Swelling    WHEAT  . Garlic   .  Onion   . Topamax [Topiramate] Other (See Comments)    Flushing, paresthesias (tingling, burning).      MEDICATIONS: Current Outpatient Medications on File Prior to Visit  Medication Sig Dispense Refill  . albuterol (PROVENTIL HFA;VENTOLIN HFA) 108 (90 BASE) MCG/ACT inhaler Inhale 2 puffs into the lungs every 6 (six) hours as needed. 1 Inhaler 3  . benzonatate (TESSALON) 200 MG capsule Take 1 capsule (200 mg total) by mouth 2 (two) times daily as needed for cough. 20 capsule 0  . Cholecalciferol (VITAMIN D) 2000 UNITS tablet Take 2,000 Units by mouth daily.    Erin Warren EPIPEN 2-PAK 0.3 MG/0.3ML SOAJ injection as needed.    . fluticasone (FLONASE) 50 MCG/ACT nasal spray SPRAY 2 SPRAYS INTO EACH NOSTRIL EVERY DAY  2  . fluticasone (FLONASE) 50 MCG/ACT nasal spray SPRAY 2 SPRAYS INTO EACH NOSTRIL EVERY DAY 16 g 2  . levothyroxine (SYNTHROID, LEVOTHROID) 25 MCG tablet Take 1 tablet (25 mcg total) by mouth daily. 90 tablet 3  . loratadine (CLARITIN) 10 MG tablet Take 10 mg by mouth daily.    . metFORMIN (GLUCOPHAGE) 500 MG tablet TAKE 1 TABLET BY MOUTH TWICE A DAY 60 tablet 0  . propranolol ER (INDERAL LA) 80 MG 24 hr capsule Take 1 capsule (80 mg total) by mouth daily. 90 capsule 4  . QVAR REDIHALER 80 MCG/ACT inhaler TAKE 1 PUFF BY MOUTH TWICE A DAY  5  . ranitidine (ZANTAC) 150 MG tablet Take 1 tablet (150 mg total) by mouth  2 (two) times daily. No more refills without office visit 180 tablet 0  . rizatriptan (MAXALT-MLT) 10 MG disintegrating tablet Take 1 tablet (10 mg total) by mouth as needed for migraine. May repeat in 2 hours if needed 9 tablet 12   No current facility-administered medications on file prior to visit.     PAST MEDICAL HISTORY: Past Medical History:  Diagnosis Date  . AC (acromioclavicular) joint bone spurs   . Allergy   . Asthma   . Bunion   . Edema, lower extremity   . GERD (gastroesophageal reflux disease)   . Heel spur    Both heels, one has fractured off  .  Hypertension   . Hypothyroidism   . Migraines   . Multiple food allergies   . Polycystic ovarian syndrome   . Seasonal allergies   . Thyroid disease   . Ulnar nerve abnormality     PAST SURGICAL HISTORY: Past Surgical History:  Procedure Laterality Date  . OOPHORECTOMY Left   . PLANTAR FASCIECTOMY Bilateral 08/2014  . TERATOMA EXCISION Left   . TUBAL LIGATION  2008    SOCIAL HISTORY: Social History   Tobacco Use  . Smoking status: Never Smoker  . Smokeless tobacco: Never Used  Substance Use Topics  . Alcohol use: Yes    Alcohol/week: 1.0 standard drinks    Types: 1 Glasses of wine per week    Comment: 1-4/month  . Drug use: No    FAMILY HISTORY: Family History  Problem Relation Age of Onset  . Thyroid disease Mother   . Allergies Mother   . Hypertension Father   . Heart disease Father   . Cancer Father        skin  . Sleep apnea Father   . Obesity Father   . Alcohol abuse Maternal Grandfather   . Hypertension Paternal Grandmother   . Hypertension Paternal Grandfather   . Diabetes Paternal Grandfather   . Hypertension Brother   . Heart Problems Brother     ROS: Review of Systems  Constitutional: Positive for weight loss.  Respiratory: Negative for shortness of breath (on exertion).   Cardiovascular: Negative for chest pain.  Gastrointestinal: Negative for nausea and vomiting.       Negative for reflux  Genitourinary: Negative for frequency.  Endo/Heme/Allergies: Negative for polydipsia.       Negative for hypoglycemia    PHYSICAL EXAM: Blood pressure 140/88, pulse 95, temperature 97.7 F (36.5 C), temperature source Oral, height 5\' 5"  (1.651 m), weight 237 lb (107.5 kg), SpO2 96 %. Body mass index is 39.44 kg/m. Physical Exam  Constitutional: She is oriented to person, place, and time. She appears well-developed and well-nourished.  Cardiovascular: Normal rate.  Pulmonary/Chest: Effort normal.  Musculoskeletal: Normal range of motion.    Neurological: She is oriented to person, place, and time.  Skin: Skin is warm and dry.  Psychiatric: She has a normal mood and affect. Her behavior is normal.  Vitals reviewed.   RECENT LABS AND TESTS: BMET    Component Value Date/Time   NA 134 06/28/2018 1037   K 4.6 06/28/2018 1037   CL 100 06/28/2018 1037   CO2 22 06/28/2018 1037   GLUCOSE 86 06/28/2018 1037   GLUCOSE 80 02/10/2016 1735   BUN 18 06/28/2018 1037   CREATININE 0.78 06/28/2018 1037   CREATININE 0.67 02/10/2016 1735   CALCIUM 9.9 06/28/2018 1037   GFRNONAA 93 06/28/2018 1037   GFRAA 107 06/28/2018 1037   Lab Results  Component Value Date   HGBA1C 5.2 06/28/2018   HGBA1C 4.7 09/27/2013   Lab Results  Component Value Date   INSULIN 14.2 06/28/2018   CBC    Component Value Date/Time   WBC 11.6 (H) 06/28/2018 1037   WBC 11.6 (H) 02/03/2018 1620   RBC 4.85 06/28/2018 1037   RBC 4.72 02/03/2018 1620   HGB 14.8 06/28/2018 1037   HCT 43.8 06/28/2018 1037   PLT 375.0 02/03/2018 1620   PLT 435 (H) 10/18/2017 1640   MCV 90 06/28/2018 1037   MCH 30.5 06/28/2018 1037   MCH 31.3 02/04/2015 1109   MCHC 33.8 06/28/2018 1037   MCHC 34.1 02/03/2018 1620   RDW 13.2 06/28/2018 1037   LYMPHSABS 1.9 06/28/2018 1037   MONOABS 0.7 02/03/2018 1620   EOSABS 0.3 06/28/2018 1037   BASOSABS 0.1 06/28/2018 1037   Iron/TIBC/Ferritin/ %Sat No results found for: IRON, TIBC, FERRITIN, IRONPCTSAT Lipid Panel     Component Value Date/Time   CHOL 181 06/28/2018 1037   TRIG 154 (H) 06/28/2018 1037   HDL 54 06/28/2018 1037   CHOLHDL 3.1 10/18/2017 1640   CHOLHDL 2.7 02/04/2015 1109   VLDL 23 02/04/2015 1109   LDLCALC 96 06/28/2018 1037   Hepatic Function Panel     Component Value Date/Time   PROT 7.4 06/28/2018 1037   ALBUMIN 4.4 06/28/2018 1037   AST 16 06/28/2018 1037   ALT 28 06/28/2018 1037   ALKPHOS 100 06/28/2018 1037   BILITOT 0.2 06/28/2018 1037      Component Value Date/Time   TSH 1.710 06/28/2018  1037   TSH 2.930 10/18/2017 1640   TSH 3.010 08/17/2016 1650    Ref. Range 06/28/2018 10:37  Vitamin D, 25-Hydroxy Latest Ref Range: 30.0 - 100.0 ng/mL 47.5   ASSESSMENT AND PLAN: Insulin resistance  Essential hypertension  Gastroesophageal reflux disease without esophagitis  At risk of diabetes mellitus  Class 2 severe obesity with serious comorbidity and body mass index (BMI) of 39.0 to 39.9 in adult, unspecified obesity type ([place])  PLAN:  Insulin Resistance Aslynn will continue to work on weight loss, exercise, and decreasing simple carbohydrates in her diet to help decrease the risk of diabetes. We dicussed metformin including benefits and risks. She was informed that eating too many simple carbohydrates or too many calories at one sitting increases the likelihood of GI side effects. Etana will continue metformin for now and prescription was not written today. Danuta agreed to follow up with [name] as directed to monitor her progress.  Diabetes risk counseling Maevyn was given extended (15 minutes) diabetes prevention counseling today. She is 42 y.o. female and has risk factors for diabetes including obesity and insulin resistance. We discussed intensive lifestyle modifications today with an emphasis on weight loss as well as increasing exercise and decreasing simple carbohydrates in her diet.  Hypertension We discussed sodium restriction, working on healthy weight loss, and a regular exercise program as the means to achieve improved blood pressure control. Camrin agreed with this plan and agreed to follow up as directed. We will continue to monitor her blood pressure as well as her progress with the above lifestyle modifications. She will decrease her sodium intake and will watch for signs of hypotension as she continues her lifestyle modifications.  GERD (gastroesophageal reflux disease) Ila will take Tums as needed and continue to take her ranitidine. Bettymae agrees to follow up with our  clinic in 2 weeks.  Obesity Kegan is currently in the action stage of change.  As such, her goal is to continue with weight loss efforts She has agreed to follow the Category 2 plan Lilah has been instructed to work up to a goal of 150 minutes of combined cardio and strengthening exercise per week for weight loss and overall health benefits. We discussed the following Behavioral Modification Strategies today: increase H2O, no skipping meals, increasing lean protein intake, decreasing simple carbohydrates, increasing vegetables, work on meal planning and easy cooking plans and holiday eating strategies  Natilee will keep snacks with her. Handout for Category 1 and Category 2 breakfast options were provided to patient today along with handout for "protein content of foods".  Alaysiah has agreed to follow up with our clinic in 2 weeks. She was informed of the importance of frequent follow up visits to maximize her success with intensive lifestyle modifications for her multiple health conditions.   OBESITY BEHAVIORAL INTERVENTION VISIT  Today's visit was # 4   Starting weight: 240 lbs Starting date: 06/28/2018 Today's weight : 237 lbs Today's date: 08/15/2018 Total lbs lost to date: 3 At least 15 minutes were spent on discussing the following behavioral intervention visit.   ASK: We discussed the diagnosis of obesity with [name] today and Dany agreed to give [name] permission to discuss obesity behavioral modification therapy today.  ASSESS: Nedra has the diagnosis of obesity and her BMI today is 39.44 Dlynn is in the action stage of change   ADVISE: Margree was educated on the multiple health risks of obesity as well as the benefit of weight loss to improve her health. She was advised of the need for long term treatment and the importance of lifestyle modifications to improve her current health and to decrease her risk of future health problems.  AGREE: Multiple dietary modification  options and treatment options were discussed and  Marylin agreed to follow the recommendations documented in the above note.  ARRANGE: Jhoana was educated on the importance of frequent visits to treat obesity as outlined per CMS and USPSTF guidelines and agreed to schedule her next follow up appointment today.  Corey Skains, am acting as Location manager for [employer]. Owens Shark, DO  I have reviewed the above documentation for accuracy and completeness, and I agree with the above. -Jearld Lesch, DO

## 2018-09-04 ENCOUNTER — Ambulatory Visit (INDEPENDENT_AMBULATORY_CARE_PROVIDER_SITE_OTHER): Payer: 59 | Admitting: Bariatrics

## 2018-09-04 ENCOUNTER — Encounter (INDEPENDENT_AMBULATORY_CARE_PROVIDER_SITE_OTHER): Payer: Self-pay | Admitting: Bariatrics

## 2018-09-04 VITALS — BP 135/85 | HR 91 | Temp 97.3°F | Ht 65.0 in | Wt 236.0 lb

## 2018-09-04 DIAGNOSIS — I10 Essential (primary) hypertension: Secondary | ICD-10-CM | POA: Diagnosis not present

## 2018-09-04 DIAGNOSIS — E8881 Metabolic syndrome: Secondary | ICD-10-CM

## 2018-09-04 NOTE — Progress Notes (Signed)
Office: [phone]  /  Fax: [phone]   HPI:   Chief Complaint: OBESITY Emmarie is here to discuss her progress with her obesity treatment plan. She is on the Category 2 plan and is following her eating plan approximately 30 % of the time. She states she is exercising 0 minutes 0 times per week. Ameirah states that she did not struggle that much. She is not struggling with hunger. Her weight is 236 lb (107 kg) today and has had a weight loss of 1 pound over a period of 3 weeks since her last visit. She has lost 4 lbs since starting treatment with [name].  Insulin Resistance Amarilis has a diagnosis of insulin resistance based on her elevated fasting insulin level >5. Her last insulin level was at 14.2 and last A1c was at 5.2. Although Mackenzy's blood glucose readings are still under good control, insulin resistance puts her at greater risk of metabolic syndrome and diabetes. She is taking metformin currently and continues to work on diet and exercise to decrease risk of diabetes.  Hypertension GIBSON TELLERIA is a 42 y.o. female with hypertension. She is taking Inderal ER. [name] denies chest pain or shortness of breath on exertion. She is working weight loss to help control her blood pressure with the goal of decreasing her risk of heart attack and stroke. [name] blood pressure is reasonably well controlled.  ASSESSMENT AND PLAN:  Insulin resistance  Essential hypertension  PLAN:  Insulin Resistance Rainee will continue to work on weight loss, exercise, and decreasing simple carbohydrates in her diet to help decrease the risk of diabetes. We dicussed metformin including benefits and risks. She was informed that eating too many simple carbohydrates or too many calories at one sitting increases the likelihood of GI side effects. Peni will continue metformin for now and prescription was not written today. Alissandra agreed to follow up with [name] as directed to monitor her  progress.  Hypertension We discussed sodium restriction, working on healthy weight loss, and a regular exercise program as the means to achieve improved blood pressure control. Montoya agreed with this plan and agreed to follow up as directed. We will continue to monitor her blood pressure as well as her progress with the above lifestyle modifications. She will continue her medications as prescribed and will watch for signs of hypotension as she continues her lifestyle modifications.  I spent > than 50% of the 15 minute visit on counseling as documented in the note.  Obesity Maribella is currently in the action stage of change. As such, her goal is to continue with weight loss efforts She has agreed to continue to follow the Category 2 plan Ashani will start a work out Actuary for weight loss and overall health benefits. We discussed the following Behavioral Modification Strategies today: increase H2O intake, increasing lean protein intake, decreasing simple carbohydrates and increasing vegetables  Oceane has agreed to follow up with our clinic in 2 weeks. She was informed of the importance of frequent follow up visits to maximize her success with intensive lifestyle modifications for her multiple health conditions.  ALLERGIES: Allergies  Allergen Reactions  . Food Hives, Nausea Only and Swelling    WHEAT  . Garlic   . Onion   . Topamax [Topiramate] Other (See Comments)    Flushing, paresthesias (tingling, burning).      MEDICATIONS: Current Outpatient Medications on File Prior to Visit  Medication Sig Dispense Refill  . albuterol (PROVENTIL HFA;VENTOLIN HFA) 108 (90  BASE) MCG/ACT inhaler Inhale 2 puffs into the lungs every 6 (six) hours as needed. 1 Inhaler 3  . benzonatate (TESSALON) 200 MG capsule Take 1 capsule (200 mg total) by mouth 2 (two) times daily as needed for cough. 20 capsule 0  . Cholecalciferol (VITAMIN D) 2000 UNITS tablet Take 2,000 Units by mouth daily.    [name] EPIPEN  2-PAK 0.3 MG/0.3ML SOAJ injection as needed.    . fluticasone (FLONASE) 50 MCG/ACT nasal spray SPRAY 2 SPRAYS INTO EACH NOSTRIL EVERY DAY  2  . fluticasone (FLONASE) 50 MCG/ACT nasal spray SPRAY 2 SPRAYS INTO EACH NOSTRIL EVERY DAY 16 g 2  . levothyroxine (SYNTHROID, LEVOTHROID) 25 MCG tablet Take 1 tablet (25 mcg total) by mouth daily. 90 tablet 3  . loratadine (CLARITIN) 10 MG tablet Take 10 mg by mouth daily.    . metFORMIN (GLUCOPHAGE) 500 MG tablet TAKE 1 TABLET BY MOUTH TWICE A DAY 60 tablet 0  . propranolol ER (INDERAL LA) 80 MG 24 hr capsule Take 1 capsule (80 mg total) by mouth daily. 90 capsule 4  . QVAR REDIHALER 80 MCG/ACT inhaler TAKE 1 PUFF BY MOUTH TWICE A DAY  5  . ranitidine (ZANTAC) 150 MG tablet Take 1 tablet (150 mg total) by mouth 2 (two) times daily. No more refills without office visit 180 tablet 0  . rizatriptan (MAXALT-MLT) 10 MG disintegrating tablet Take 1 tablet (10 mg total) by mouth as needed for migraine. May repeat in 2 hours if needed 9 tablet 12   No current facility-administered medications on file prior to visit.     PAST MEDICAL HISTORY: Past Medical History:  Diagnosis Date  . AC (acromioclavicular) joint bone spurs   . Allergy   . Asthma   . Bunion   . Edema, lower extremity   . GERD (gastroesophageal reflux disease)   . Heel spur    Both heels, one has fractured off  . Hypertension   . Hypothyroidism   . Migraines   . Multiple food allergies   . Polycystic ovarian syndrome   . Seasonal allergies   . Thyroid disease   . Ulnar nerve abnormality     PAST SURGICAL HISTORY: Past Surgical History:  Procedure Laterality Date  . OOPHORECTOMY Left   . PLANTAR FASCIECTOMY Bilateral 08/2014  . TERATOMA EXCISION Left   . TUBAL LIGATION  2008    SOCIAL HISTORY: Social History   Tobacco Use  . Smoking status: Never Smoker  . Smokeless tobacco: Never Used  Substance Use Topics  . Alcohol use: Yes    Alcohol/week: 1.0 standard drinks     Types: 1 Glasses of wine per week    Comment: 1-4/month  . Drug use: No    FAMILY HISTORY: Family History  Problem Relation Age of Onset  . Thyroid disease Mother   . Allergies Mother   . Hypertension Father   . Heart disease Father   . Cancer Father        skin  . Sleep apnea Father   . Obesity Father   . Alcohol abuse Maternal Grandfather   . Hypertension Paternal Grandmother   . Hypertension Paternal Grandfather   . Diabetes Paternal Grandfather   . Hypertension Brother   . Heart Problems Brother     ROS: Review of Systems  Constitutional: Positive for weight loss.  Respiratory: Negative for shortness of breath (on exertion).   Cardiovascular: Negative for chest pain.    PHYSICAL EXAM: Blood pressure 135/85, pulse 91,  temperature (!) 97.3 F (36.3 C), temperature source Oral, height 5\' 5"  (1.651 m), weight 236 lb (107 kg), SpO2 96 %. Body mass index is 39.27 kg/m. Physical Exam Vitals signs reviewed.  Constitutional:      Appearance: Normal appearance. She is well-developed. She is obese.  Cardiovascular:     Rate and Rhythm: Normal rate.  Pulmonary:     Effort: Pulmonary effort is normal.  Musculoskeletal: Normal range of motion.  Skin:    General: Skin is warm and dry.  Neurological:     Mental Status: She is alert and oriented to person, place, and time.  Psychiatric:        Mood and Affect: Mood normal.        Behavior: Behavior normal.     RECENT LABS AND TESTS: BMET    Component Value Date/Time   NA 134 06/28/2018 1037   K 4.6 06/28/2018 1037   CL 100 06/28/2018 1037   CO2 22 06/28/2018 1037   GLUCOSE 86 06/28/2018 1037   GLUCOSE 80 02/10/2016 1735   BUN 18 06/28/2018 1037   CREATININE 0.78 06/28/2018 1037   CREATININE 0.67 02/10/2016 1735   CALCIUM 9.9 06/28/2018 1037   GFRNONAA 93 06/28/2018 1037   GFRAA 107 06/28/2018 1037   Lab Results  Component Value Date   HGBA1C 5.2 06/28/2018   HGBA1C 4.7 09/27/2013   Lab Results   Component Value Date   INSULIN 14.2 06/28/2018   CBC    Component Value Date/Time   WBC 11.6 (H) 06/28/2018 1037   WBC 11.6 (H) 02/03/2018 1620   RBC 4.85 06/28/2018 1037   RBC 4.72 02/03/2018 1620   HGB 14.8 06/28/2018 1037   HCT 43.8 06/28/2018 1037   PLT 375.0 02/03/2018 1620   PLT 435 (H) 10/18/2017 1640   MCV 90 06/28/2018 1037   MCH 30.5 06/28/2018 1037   MCH 31.3 02/04/2015 1109   MCHC 33.8 06/28/2018 1037   MCHC 34.1 02/03/2018 1620   RDW 13.2 06/28/2018 1037   LYMPHSABS 1.9 06/28/2018 1037   MONOABS 0.7 02/03/2018 1620   EOSABS 0.3 06/28/2018 1037   BASOSABS 0.1 06/28/2018 1037   Iron/TIBC/Ferritin/ %Sat No results found for: IRON, TIBC, FERRITIN, IRONPCTSAT Lipid Panel     Component Value Date/Time   CHOL 181 06/28/2018 1037   TRIG 154 (H) 06/28/2018 1037   HDL 54 06/28/2018 1037   CHOLHDL 3.1 10/18/2017 1640   CHOLHDL 2.7 02/04/2015 1109   VLDL 23 02/04/2015 1109   LDLCALC 96 06/28/2018 1037   Hepatic Function Panel     Component Value Date/Time   PROT 7.4 06/28/2018 1037   ALBUMIN 4.4 06/28/2018 1037   AST 16 06/28/2018 1037   ALT 28 06/28/2018 1037   ALKPHOS 100 06/28/2018 1037   BILITOT 0.2 06/28/2018 1037      Component Value Date/Time   TSH 1.710 06/28/2018 1037   TSH 2.930 10/18/2017 1640   TSH 3.010 08/17/2016 1650   Results for NEYAH, ELLERMAN (MRN [id number]) as of 09/04/2018 15:09  Ref. Range 06/28/2018 10:37  Vitamin D, 25-Hydroxy Latest Ref Range: 30.0 - 100.0 ng/mL 47.5     OBESITY BEHAVIORAL INTERVENTION VISIT  Today's visit was # 5   Starting weight: 240 lbs Starting date: 06/28/2018 Today's weight : 236 lbs Today's date: 09/04/2018 Total lbs lost to date: 4   ASK: We discussed the diagnosis of obesity with [name] today and Manuela Schwartz agreed to give [name] permission to discuss obesity behavioral modification  therapy today.  ASSESS: Aaron has the diagnosis of obesity and her BMI today is 39.27 Mariadelaluz is in the action  stage of change   ADVISE: Katharine was educated on the multiple health risks of obesity as well as the benefit of weight loss to improve her health. She was advised of the need for long term treatment and the importance of lifestyle modifications to improve her current health and to decrease her risk of future health problems.  AGREE: Multiple dietary modification options and treatment options were discussed and  Kent agreed to follow the recommendations documented in the above note.  ARRANGE: Gagandeep was educated on the importance of frequent visits to treat obesity as outlined per CMS and USPSTF guidelines and agreed to schedule her next follow up appointment today.  Corey Skains, am acting as Location manager for [employer]. Owens Shark, DO  I have reviewed the above documentation for accuracy and completeness, and I agree with the above. -Jearld Lesch, DO

## 2018-09-20 ENCOUNTER — Encounter (INDEPENDENT_AMBULATORY_CARE_PROVIDER_SITE_OTHER): Payer: Self-pay | Admitting: Bariatrics

## 2018-09-20 ENCOUNTER — Ambulatory Visit (INDEPENDENT_AMBULATORY_CARE_PROVIDER_SITE_OTHER): Payer: 59 | Admitting: Bariatrics

## 2018-09-20 VITALS — BP 130/93 | HR 93 | Temp 98.1°F | Ht 65.0 in | Wt 239.0 lb

## 2018-09-20 DIAGNOSIS — Z6839 Body mass index (BMI) 39.0-39.9, adult: Secondary | ICD-10-CM

## 2018-09-20 DIAGNOSIS — E8881 Metabolic syndrome: Secondary | ICD-10-CM

## 2018-09-20 DIAGNOSIS — I10 Essential (primary) hypertension: Secondary | ICD-10-CM

## 2018-09-21 NOTE — Progress Notes (Signed)
Office: [phone]  /  Fax: [phone]   HPI:   Chief Complaint: OBESITY Erin Warren is here to discuss her progress with her obesity treatment plan. She is on the Category 2 plan and is following her eating plan approximately 60 % of the time. She states she is exercising 0 minutes 0 times per week. Erin Warren struggled some with eating out more. She is still having issues with breakfast. Her weight is 239 lb (108.4 kg) today and has had a weight gain of 3 pounds over a period of 2 weeks since her last visit. She has lost 1 lb since starting treatment with [name].  Hypertension Erin Warren is a 43 y.o. female with hypertension. She is taking Inderal LA. Her diastolic blood pressure is elevated today. Erin Warren denies chest pain or shortness of breath on exertion. She is working weight loss to help control her blood pressure with the goal of decreasing her risk of heart attack and stroke. Erin Warren blood pressure is not currently controlled.  Insulin Resistance Erin Warren has a diagnosis of insulin resistance based on her elevated fasting insulin level >5. Although Erin Warren's blood glucose readings are still under good control, insulin resistance puts her at greater risk of metabolic syndrome and diabetes. Her last A1c was at 5.2 and last insulin level was at 14.2 She is taking metformin currently and continues to work on diet and exercise to decrease risk of diabetes.  ASSESSMENT AND PLAN:  Essential hypertension  Insulin resistance  Class 2 severe obesity with serious comorbidity and body mass index (BMI) of 39.0 to 39.9 in adult, unspecified obesity type ([place])  PLAN:  Hypertension We discussed sodium restriction, working on healthy weight loss, and a regular exercise program as the means to achieve improved blood pressure control. Erin Warren agreed with this plan and agreed to follow up as directed. We will continue to monitor her blood pressure as well as her progress with the above lifestyle  modifications. She will continue her medications as prescribed and will watch for signs of hypotension as she continues her lifestyle modifications.  Insulin Resistance Erin Warren will continue to work on weight loss, exercise, and decreasing simple carbohydrates in her diet to help decrease the risk of diabetes. We dicussed metformin including benefits and risks. She was informed that eating too many simple carbohydrates or too many calories at one sitting increases the likelihood of GI side effects. Erin Warren will continue  metformin for now and prescription was not written today. Erin Warren agreed to follow up with [name] as directed to monitor her progress.  I spent > than 50% of the 15 minute visit on counseling as documented in the note.  Obesity Erin Warren is currently in the action stage of change. As such, her goal is to continue with weight loss efforts She has agreed to start keeping a food journal with 400 to 500 calories and 35+ grams of protein at supper on the weekends and follow the Category 2 plan Erin Warren has been instructed to work up to a goal of 150 minutes of combined cardio and strengthening exercise per week for weight loss and overall health benefits. We discussed the following Behavioral Modification Strategies today: increase H2O intake, keeping healthy foods in the home, increasing lean protein intake, decreasing simple carbohydrates, increasing vegetables and work on meal planning and easy cooking plans Handouts for homemade seasonings and store bought seasonings were provided to patient today.  Erin Warren has agreed to follow up with our clinic in 2 weeks. She was  informed of the importance of frequent follow up visits to maximize her success with intensive lifestyle modifications for her multiple health conditions.  ALLERGIES: Allergies  Allergen Reactions  . Food Hives, Nausea Only and Swelling    WHEAT  . Garlic   . Onion   . Topamax [Topiramate] Other (See Comments)    Flushing,  paresthesias (tingling, burning).      MEDICATIONS: Current Outpatient Medications on File Prior to Visit  Medication Sig Dispense Refill  . albuterol (PROVENTIL HFA;VENTOLIN HFA) 108 (90 BASE) MCG/ACT inhaler Inhale 2 puffs into the lungs every 6 (six) hours as needed. 1 Inhaler 3  . benzonatate (TESSALON) 200 MG capsule Take 1 capsule (200 mg total) by mouth 2 (two) times daily as needed for cough. 20 capsule 0  . Cholecalciferol (VITAMIN D) 2000 UNITS tablet Take 2,000 Units by mouth daily.    [name] EPIPEN 2-PAK 0.3 MG/0.3ML SOAJ injection as needed.    . fluticasone (FLONASE) 50 MCG/ACT nasal spray SPRAY 2 SPRAYS INTO EACH NOSTRIL EVERY DAY  2  . fluticasone (FLONASE) 50 MCG/ACT nasal spray SPRAY 2 SPRAYS INTO EACH NOSTRIL EVERY DAY 16 g 2  . levothyroxine (SYNTHROID, LEVOTHROID) 25 MCG tablet Take 1 tablet (25 mcg total) by mouth daily. 90 tablet 3  . loratadine (CLARITIN) 10 MG tablet Take 10 mg by mouth daily.    . metFORMIN (GLUCOPHAGE) 500 MG tablet TAKE 1 TABLET BY MOUTH TWICE A DAY 60 tablet 0  . propranolol ER (INDERAL LA) 80 MG 24 hr capsule Take 1 capsule (80 mg total) by mouth daily. 90 capsule 4  . QVAR REDIHALER 80 MCG/ACT inhaler TAKE 1 PUFF BY MOUTH TWICE A DAY  5  . ranitidine (ZANTAC) 150 MG tablet Take 1 tablet (150 mg total) by mouth 2 (two) times daily. No more refills without office visit 180 tablet 0  . rizatriptan (MAXALT-MLT) 10 MG disintegrating tablet Take 1 tablet (10 mg total) by mouth as needed for migraine. May repeat in 2 hours if needed 9 tablet 12   No current facility-administered medications on file prior to visit.     PAST MEDICAL HISTORY: Past Medical History:  Diagnosis Date  . AC (acromioclavicular) joint bone spurs   . Allergy   . Asthma   . Bunion   . Edema, lower extremity   . GERD (gastroesophageal reflux disease)   . Heel spur    Both heels, one has fractured off  . Hypertension   . Hypothyroidism   . Migraines   . Multiple food  allergies   . Polycystic ovarian syndrome   . Seasonal allergies   . Thyroid disease   . Ulnar nerve abnormality     PAST SURGICAL HISTORY: Past Surgical History:  Procedure Laterality Date  . OOPHORECTOMY Left   . PLANTAR FASCIECTOMY Bilateral 08/2014  . TERATOMA EXCISION Left   . TUBAL LIGATION  2008    SOCIAL HISTORY: Social History   Tobacco Use  . Smoking status: Never Smoker  . Smokeless tobacco: Never Used  Substance Use Topics  . Alcohol use: Yes    Alcohol/week: 1.0 standard drinks    Types: 1 Glasses of wine per week    Comment: 1-4/month  . Drug use: No    FAMILY HISTORY: Family History  Problem Relation Age of Onset  . Thyroid disease Mother   . Allergies Mother   . Hypertension Father   . Heart disease Father   . Cancer Father  skin  . Sleep apnea Father   . Obesity Father   . Alcohol abuse Maternal Grandfather   . Hypertension Paternal Grandmother   . Hypertension Paternal Grandfather   . Diabetes Paternal Grandfather   . Hypertension Brother   . Heart Problems Brother     ROS: Review of Systems  Constitutional: Negative for weight loss.  Cardiovascular: Negative for chest pain.    PHYSICAL EXAM: Blood pressure (!) 130/93, pulse 93, temperature 98.1 F (36.7 C), temperature source Oral, height 5\' 5"  (1.651 m), weight 239 lb (108.4 kg), last menstrual period 09/04/2018, SpO2 94 %. Body mass index is 39.77 kg/m. Physical Exam Vitals signs reviewed.  Constitutional:      Appearance: Normal appearance. She is well-developed. She is obese.  Cardiovascular:     Rate and Rhythm: Normal rate.  Pulmonary:     Effort: Pulmonary effort is normal.  Musculoskeletal: Normal range of motion.  Skin:    General: Skin is warm and dry.  Neurological:     Mental Status: She is alert and oriented to person, place, and time.  Psychiatric:        Mood and Affect: Mood normal.        Behavior: Behavior normal.     RECENT LABS AND  TESTS: BMET    Component Value Date/Time   NA 134 06/28/2018 1037   K 4.6 06/28/2018 1037   CL 100 06/28/2018 1037   CO2 22 06/28/2018 1037   GLUCOSE 86 06/28/2018 1037   GLUCOSE 80 02/10/2016 1735   BUN 18 06/28/2018 1037   CREATININE 0.78 06/28/2018 1037   CREATININE 0.67 02/10/2016 1735   CALCIUM 9.9 06/28/2018 1037   GFRNONAA 93 06/28/2018 1037   GFRAA 107 06/28/2018 1037   Lab Results  Component Value Date   HGBA1C 5.2 06/28/2018   HGBA1C 4.7 09/27/2013   Lab Results  Component Value Date   INSULIN 14.2 06/28/2018   CBC    Component Value Date/Time   WBC 11.6 (H) 06/28/2018 1037   WBC 11.6 (H) 02/03/2018 1620   RBC 4.85 06/28/2018 1037   RBC 4.72 02/03/2018 1620   HGB 14.8 06/28/2018 1037   HCT 43.8 06/28/2018 1037   PLT 375.0 02/03/2018 1620   PLT 435 (H) 10/18/2017 1640   MCV 90 06/28/2018 1037   MCH 30.5 06/28/2018 1037   MCH 31.3 02/04/2015 1109   MCHC 33.8 06/28/2018 1037   MCHC 34.1 02/03/2018 1620   RDW 13.2 06/28/2018 1037   LYMPHSABS 1.9 06/28/2018 1037   MONOABS 0.7 02/03/2018 1620   EOSABS 0.3 06/28/2018 1037   BASOSABS 0.1 06/28/2018 1037   Iron/TIBC/Ferritin/ %Sat No results found for: IRON, TIBC, FERRITIN, IRONPCTSAT Lipid Panel     Component Value Date/Time   CHOL 181 06/28/2018 1037   TRIG 154 (H) 06/28/2018 1037   HDL 54 06/28/2018 1037   CHOLHDL 3.1 10/18/2017 1640   CHOLHDL 2.7 02/04/2015 1109   VLDL 23 02/04/2015 1109   LDLCALC 96 06/28/2018 1037   Hepatic Function Panel     Component Value Date/Time   PROT 7.4 06/28/2018 1037   ALBUMIN 4.4 06/28/2018 1037   AST 16 06/28/2018 1037   ALT 28 06/28/2018 1037   ALKPHOS 100 06/28/2018 1037   BILITOT 0.2 06/28/2018 1037      Component Value Date/Time   TSH 1.710 06/28/2018 1037   TSH 2.930 10/18/2017 1640   TSH 3.010 08/17/2016 1650    Ref. Range 06/28/2018 10:37  Vitamin D, 25-Hydroxy Latest  Ref Range: 30.0 - 100.0 ng/mL 47.5     OBESITY BEHAVIORAL INTERVENTION  VISIT  Today's visit was # 6   Starting weight: 240 lbs Starting date: 06/28/2018 Today's weight : 239 lbs Today's date: 09/20/2018 Total lbs lost to date: 1   ASK: We discussed the diagnosis of obesity with Erin Warren today and Carilyn agreed to give [name] permission to discuss obesity behavioral modification therapy today.  ASSESS: Vania has the diagnosis of obesity and her BMI today is 39.77 Nandi is in the action stage of change   ADVISE: Erin Warren was educated on the multiple health risks of obesity as well as the benefit of weight loss to improve her health. She was advised of the need for long term treatment and the importance of lifestyle modifications to improve her current health and to decrease her risk of future health problems.  AGREE: Multiple dietary modification options and treatment options were discussed and  Deveney agreed to follow the recommendations documented in the above note.  ARRANGE: Shantera was educated on the importance of frequent visits to treat obesity as outlined per CMS and USPSTF guidelines and agreed to schedule her next follow up appointment today.  Corey Skains, am acting as Location manager for [employer]. Owens Shark, DO  I have reviewed the above documentation for accuracy and completeness, and I agree with the above. -Jearld Lesch, DO

## 2018-09-27 ENCOUNTER — Ambulatory Visit: Payer: 59 | Admitting: Diagnostic Neuroimaging

## 2018-09-27 ENCOUNTER — Encounter: Payer: Self-pay | Admitting: Diagnostic Neuroimaging

## 2018-09-27 DIAGNOSIS — G43809 Other migraine, not intractable, without status migrainosus: Secondary | ICD-10-CM

## 2018-09-27 MED ORDER — PROPRANOLOL HCL ER 80 MG PO CP24: 80.0000 mg | ORAL_CAPSULE | Freq: Every day | ORAL | 4 refills | Status: DC

## 2018-09-27 MED ORDER — RIZATRIPTAN BENZOATE 10 MG PO TBDP: 10.0000 mg | ORAL_TABLET | ORAL | 12 refills | Status: DC | PRN
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
## 2018-10-11 ENCOUNTER — Encounter (INDEPENDENT_AMBULATORY_CARE_PROVIDER_SITE_OTHER): Payer: Self-pay | Admitting: Bariatrics

## 2018-10-11 ENCOUNTER — Ambulatory Visit (INDEPENDENT_AMBULATORY_CARE_PROVIDER_SITE_OTHER): Payer: 59 | Admitting: Bariatrics

## 2018-10-11 VITALS — BP 135/92 | HR 94 | Temp 97.9°F | Ht 65.0 in | Wt 238.0 lb

## 2018-10-11 DIAGNOSIS — E8881 Metabolic syndrome: Secondary | ICD-10-CM | POA: Diagnosis not present

## 2018-10-11 DIAGNOSIS — Z6839 Body mass index (BMI) 39.0-39.9, adult: Secondary | ICD-10-CM

## 2018-10-12 NOTE — Progress Notes (Signed)
Office: [phone]  /  Fax: [phone]   HPI:   Chief Complaint: OBESITY Erin Warren is here to discuss her progress with her obesity treatment plan. She is on the keep a food journal with 400 to 500 calories and 35+ grams of protein at supper and on the weekends and the Category 2 plan and is following her eating plan approximately 25 % of the time. She states she is exercising 0 minutes 0 times per week. Erin Warren has been having "stomach issues", and she thinks this is related to what she is eating. She is concerned "        ". She has been eating more carbohydrates (increased yogurt and cottage cheese). Her weight is 238 lb (108 kg) today and has had a weight loss of 1 pounds over a period of 3 weeks since her last visit. She has lost 2 lbs since starting treatment with [name].  Insulin Resistance Erin Warren has a diagnosis of insulin resistance based on her elevated fasting insulin level >5. Although Erin Warren's blood glucose readings are still under good control, insulin resistance puts her at greater risk of metabolic syndrome and diabetes. She is taking metformin currently and continues to work on diet and exercise to decrease risk of diabetes. Erin Warren denies polyphagia.  ASSESSMENT AND PLAN:  Insulin resistance  Class 2 severe obesity with serious comorbidity and body mass index (BMI) of 39.0 to 39.9 in adult, unspecified obesity type ([place])  PLAN:  Insulin Resistance Erin Warren will continue to work on weight loss, exercise, and decreasing simple carbohydrates in her diet to help decrease the risk of diabetes. We dicussed metformin including benefits and risks. She was informed that eating too many simple carbohydrates or too many calories at one sitting increases the likelihood of GI side effects. Erin Warren will continue metformin for now and prescription was not written today. Erin Warren agreed to follow up with [name] as directed to monitor her progress.  I spent > than 50% of the 15 minute visit on counseling as  documented in the note.  Obesity Erin Warren is currently in the action stage of change. As such, her goal is to continue with weight loss efforts She has agreed to keep a food journal with 400 to 500 calories and 35+ grams of protein at supper daily and follow the Category 2 plan Erin Warren has been instructed to work up to a goal of 150 minutes of combined cardio and strengthening exercise per week for weight loss and overall health benefits. We discussed the following Behavioral Modification Strategies today: increase H2O intake, increasing lean protein intake, decreasing simple carbohydrates, increasing vegetables and work on meal planning and intentional eating Options for lunch (guidelines for breakfast, lunch and dinner), was given to patient today, along with calorie and protein goals.  Erin Warren has agreed to follow up with our clinic in 2 weeks fasting. She was informed of the importance of frequent follow up visits to maximize her success with intensive lifestyle modifications for her multiple health conditions.  ALLERGIES: Allergies  Allergen Reactions  . Food Hives, Nausea Only and Swelling    WHEAT  . Garlic   . Onion   . Topamax [Topiramate] Other (See Comments)    Flushing, paresthesias (tingling, burning).      MEDICATIONS: Current Outpatient Medications on File Prior to Visit  Medication Sig Dispense Refill  . albuterol (PROVENTIL HFA;VENTOLIN HFA) 108 (90 BASE) MCG/ACT inhaler Inhale 2 puffs into the lungs every 6 (six) hours as needed. 1 Inhaler 3  .  Cholecalciferol (VITAMIN D) 2000 UNITS tablet Take 2,000 Units by mouth daily.    [name] EPIPEN 2-PAK 0.3 MG/0.3ML SOAJ injection as needed.    . fluticasone (FLONASE) 50 MCG/ACT nasal spray SPRAY 2 SPRAYS INTO EACH NOSTRIL EVERY DAY 16 g 2  . levothyroxine (SYNTHROID, LEVOTHROID) 25 MCG tablet Take 1 tablet (25 mcg total) by mouth daily. 90 tablet 3  . loratadine (CLARITIN) 10 MG tablet Take 10 mg by mouth daily.    . metFORMIN  (GLUCOPHAGE) 500 MG tablet TAKE 1 TABLET BY MOUTH TWICE A DAY 60 tablet 0  . propranolol ER (INDERAL LA) 80 MG 24 hr capsule Take 1 capsule (80 mg total) by mouth daily. 90 capsule 4  . QVAR REDIHALER 80 MCG/ACT inhaler TAKE 1 PUFF BY MOUTH TWICE A DAY  5  . ranitidine (ZANTAC) 150 MG tablet Take 1 tablet (150 mg total) by mouth 2 (two) times daily. No more refills without office visit 180 tablet 0  . rizatriptan (MAXALT-MLT) 10 MG disintegrating tablet Take 1 tablet (10 mg total) by mouth as needed for migraine. May repeat in 2 hours if needed 9 tablet 12   No current facility-administered medications on file prior to visit.     PAST MEDICAL HISTORY: Past Medical History:  Diagnosis Date  . AC (acromioclavicular) joint bone spurs   . Allergy   . Asthma   . Bunion   . Edema, lower extremity   . GERD (gastroesophageal reflux disease)   . Heel spur    Both heels, one has fractured off  . Hypertension   . Hypothyroidism   . Migraines   . Multiple food allergies   . Polycystic ovarian syndrome   . Seasonal allergies   . Thyroid disease   . Ulnar nerve abnormality     PAST SURGICAL HISTORY: Past Surgical History:  Procedure Laterality Date  . OOPHORECTOMY Left   . PLANTAR FASCIECTOMY Bilateral 08/2014  . TERATOMA EXCISION Left   . TUBAL LIGATION  2008    SOCIAL HISTORY: Social History   Tobacco Use  . Smoking status: Never Smoker  . Smokeless tobacco: Never Used  Substance Use Topics  . Alcohol use: Yes    Alcohol/week: 1.0 standard drinks    Types: 1 Glasses of wine per week    Comment: 1-4/month  . Drug use: No    FAMILY HISTORY: Family History  Problem Relation Age of Onset  . Thyroid disease Mother   . Allergies Mother   . Hypertension Father   . Heart disease Father   . Cancer Father        skin  . Sleep apnea Father   . Obesity Father   . Alcohol abuse Maternal Grandfather   . Hypertension Paternal Grandmother   . Hypertension Paternal Grandfather     . Diabetes Paternal Grandfather   . Hypertension Brother   . Heart Problems Brother     ROS: Review of Systems  Constitutional: Positive for weight loss.  Endo/Heme/Allergies:       Negative for polyphagia    PHYSICAL EXAM: Pulse 94, temperature 97.9 F (36.6 C), temperature source Oral, height 5\' 5"  (1.651 m), weight 238 lb (108 kg), SpO2 100 %. Body mass index is 39.61 kg/m. Physical Exam Vitals signs reviewed.  Constitutional:      Appearance: Normal appearance. She is well-developed. She is obese.  Cardiovascular:     Rate and Rhythm: Normal rate.  Pulmonary:     Effort: Pulmonary effort is normal.  Musculoskeletal: Normal range of motion.  Skin:    General: Skin is warm and dry.  Neurological:     Mental Status: She is alert and oriented to person, place, and time.  Psychiatric:        Mood and Affect: Mood normal.        Behavior: Behavior normal.     RECENT LABS AND TESTS: BMET    Component Value Date/Time   NA 134 06/28/2018 1037   K 4.6 06/28/2018 1037   CL 100 06/28/2018 1037   CO2 22 06/28/2018 1037   GLUCOSE 86 06/28/2018 1037   GLUCOSE 80 02/10/2016 1735   BUN 18 06/28/2018 1037   CREATININE 0.78 06/28/2018 1037   CREATININE 0.67 02/10/2016 1735   CALCIUM 9.9 06/28/2018 1037   GFRNONAA 93 06/28/2018 1037   GFRAA 107 06/28/2018 1037   Lab Results  Component Value Date   HGBA1C 5.2 06/28/2018   HGBA1C 4.7 09/27/2013   Lab Results  Component Value Date   INSULIN 14.2 06/28/2018   CBC    Component Value Date/Time   WBC 11.6 (H) 06/28/2018 1037   WBC 11.6 (H) 02/03/2018 1620   RBC 4.85 06/28/2018 1037   RBC 4.72 02/03/2018 1620   HGB 14.8 06/28/2018 1037   HCT 43.8 06/28/2018 1037   PLT 375.0 02/03/2018 1620   PLT 435 (H) 10/18/2017 1640   MCV 90 06/28/2018 1037   MCH 30.5 06/28/2018 1037   MCH 31.3 02/04/2015 1109   MCHC 33.8 06/28/2018 1037   MCHC 34.1 02/03/2018 1620   RDW 13.2 06/28/2018 1037   LYMPHSABS 1.9 06/28/2018 1037    MONOABS 0.7 02/03/2018 1620   EOSABS 0.3 06/28/2018 1037   BASOSABS 0.1 06/28/2018 1037   Iron/TIBC/Ferritin/ %Sat No results found for: IRON, TIBC, FERRITIN, IRONPCTSAT Lipid Panel     Component Value Date/Time   CHOL 181 06/28/2018 1037   TRIG 154 (H) 06/28/2018 1037   HDL 54 06/28/2018 1037   CHOLHDL 3.1 10/18/2017 1640   CHOLHDL 2.7 02/04/2015 1109   VLDL 23 02/04/2015 1109   LDLCALC 96 06/28/2018 1037   Hepatic Function Panel     Component Value Date/Time   PROT 7.4 06/28/2018 1037   ALBUMIN 4.4 06/28/2018 1037   AST 16 06/28/2018 1037   ALT 28 06/28/2018 1037   ALKPHOS 100 06/28/2018 1037   BILITOT 0.2 06/28/2018 1037      Component Value Date/Time   TSH 1.710 06/28/2018 1037   TSH 2.930 10/18/2017 1640   TSH 3.010 08/17/2016 1650     Ref. Range 06/28/2018 10:37  Vitamin D, 25-Hydroxy Latest Ref Range: 30.0 - 100.0 ng/mL 47.5     OBESITY BEHAVIORAL INTERVENTION VISIT  Today's visit was # 7   Starting weight: 240 lbs Starting date: 06/28/2018 Today's weight : 238 lbs Today's date: 10/11/2018 Total lbs lost to date: 2   ASK: We discussed the diagnosis of obesity with [name] today and Emmalina agreed to give [name] permission to discuss obesity behavioral modification therapy today.  ASSESS: Devaney has the diagnosis of obesity and her BMI today is 39.61 Lorenda is in the action stage of change   ADVISE: Katurah was educated on the multiple health risks of obesity as well as the benefit of weight loss to improve her health. She was advised of the need for long term treatment and the importance of lifestyle modifications to improve her current health and to decrease her risk of future health problems.  AGREE: Multiple  dietary modification options and treatment options were discussed and  Ron agreed to follow the recommendations documented in the above note.  ARRANGE: Marrian was educated on the importance of frequent visits to treat obesity as outlined per  CMS and USPSTF guidelines and agreed to schedule her next follow up appointment today.  Corey Skains, am acting as Location manager for [employer]. Owens Shark, DO  I have reviewed the above documentation for accuracy and completeness, and I agree with the above. -Jearld Lesch, DO  '

## 2018-11-01 ENCOUNTER — Ambulatory Visit (INDEPENDENT_AMBULATORY_CARE_PROVIDER_SITE_OTHER): Payer: 59 | Admitting: Bariatrics

## 2018-11-01 ENCOUNTER — Encounter (INDEPENDENT_AMBULATORY_CARE_PROVIDER_SITE_OTHER): Payer: Self-pay | Admitting: Bariatrics

## 2018-11-01 VITALS — BP 134/91 | HR 86 | Temp 97.8°F | Ht 65.0 in | Wt 236.0 lb

## 2018-11-01 DIAGNOSIS — Z6839 Body mass index (BMI) 39.0-39.9, adult: Secondary | ICD-10-CM

## 2018-11-01 DIAGNOSIS — E8881 Metabolic syndrome: Secondary | ICD-10-CM

## 2018-11-01 NOTE — Progress Notes (Signed)
Office: [phone]  /  Fax: [phone]   HPI:   Chief Complaint: OBESITY Erin Warren is here to discuss her progress with her obesity treatment plan. She is on the keep a food journal with 400-500 calories and 35+ grams of protein at supper daily and follow the Category 2 plan and is following her eating plan approximately 75 % of the time. She states she is exercising 0 minutes 0 times per week. Erin Warren is doing ok overall.  Her weight is 236 lb (107 kg) today and has had a weight loss of 2 pounds over a period of 3 weeks since her last visit. She has lost 4 lbs since starting treatment with [name].  Insulin Resistance Erin Warren has a diagnosis of insulin resistance based on her elevated fasting insulin level >5. Although Erin Warren's blood glucose readings are still under good control, insulin resistance puts her at greater risk of metabolic syndrome and diabetes. She is taking metformin currently with no side effects and continues to work on diet and exercise to decrease risk of diabetes.  ASSESSMENT AND PLAN:  Insulin resistance  Class 2 severe obesity with serious comorbidity and body mass index (BMI) of 39.0 to 39.9 in adult, unspecified obesity type (Erin Warren)  PLAN:  Insulin Resistance Erin Warren will continue to work on weight loss, exercise, and decreasing simple carbohydrates in her diet to help decrease the risk of diabetes. We dicussed metformin including benefits and risks. She was informed that eating too many simple carbohydrates or too many calories at one sitting increases the likelihood of GI side effects. Erin Warren agrees to continue taking metformin, and she agrees to follow up with our clinic in 2 weeks as directed to monitor her progress.  I spent > than 50% of the 15 minute visit on counseling as documented in the note.  Obesity Erin Warren is currently in the action stage of change. As such, her goal is to continue with weight loss efforts She has agreed to keep a food journal with 1350 calories  and 80-85+ grams of protein daily Erin Warren has been instructed to work up to a goal of 150 minutes of combined cardio and strengthening exercise per week for weight loss and overall health benefits. We discussed the following Behavioral Modification Strategies today: increasing lean protein intake, decreasing simple carbohydrates, increasing vegetables, increase H20 intake, decrease eating out, no skipping meals, work on meal planning and easy cooking plans, keeping healthy foods in the home, and decrease liquid calories We discussed protein options for breakfast and will space out protein (more at breakfast).  Erin Warren has agreed to follow up with our clinic in 2 weeks. She was informed of the importance of frequent follow up visits to maximize her success with intensive lifestyle modifications for her multiple health conditions.  ALLERGIES: Allergies  Allergen Reactions  . Food Hives, Nausea Only and Swelling    WHEAT  . Garlic   . Onion   . Topamax [Topiramate] Other (See Comments)    Flushing, paresthesias (tingling, burning).      MEDICATIONS: Current Outpatient Medications on File Prior to Visit  Medication Sig Dispense Refill  . albuterol (PROVENTIL HFA;VENTOLIN HFA) 108 (90 BASE) MCG/ACT inhaler Inhale 2 puffs into the lungs every 6 (six) hours as needed. 1 Inhaler 3  . Cholecalciferol (VITAMIN D) 2000 UNITS tablet Take 2,000 Units by mouth daily.    [name] EPIPEN 2-PAK 0.3 MG/0.3ML SOAJ injection as needed.    . fluticasone (FLONASE) 50 MCG/ACT nasal spray SPRAY 2 SPRAYS INTO [facility]  NOSTRIL EVERY DAY 16 g 2  . levothyroxine (SYNTHROID, LEVOTHROID) 25 MCG tablet Take 1 tablet (25 mcg total) by mouth daily. 90 tablet 3  . loratadine (CLARITIN) 10 MG tablet Take 10 mg by mouth daily.    . metFORMIN (GLUCOPHAGE) 500 MG tablet TAKE 1 TABLET BY MOUTH TWICE A DAY 60 tablet 0  . propranolol ER (INDERAL LA) 80 MG 24 hr capsule Take 1 capsule (80 mg total) by mouth daily. 90 capsule 4  . QVAR  REDIHALER 80 MCG/ACT inhaler TAKE 1 PUFF BY MOUTH TWICE A DAY  5  . ranitidine (ZANTAC) 150 MG tablet Take 1 tablet (150 mg total) by mouth 2 (two) times daily. No more refills without office visit 180 tablet 0  . rizatriptan (MAXALT-MLT) 10 MG disintegrating tablet Take 1 tablet (10 mg total) by mouth as needed for migraine. May repeat in 2 hours if needed 9 tablet 12   No current facility-administered medications on file prior to visit.     PAST MEDICAL HISTORY: Past Medical History:  Diagnosis Date  . AC (acromioclavicular) joint bone spurs   . Allergy   . Asthma   . Bunion   . Edema, lower extremity   . GERD (gastroesophageal reflux disease)   . Heel spur    Both heels, one has fractured off  . Hypertension   . Hypothyroidism   . Migraines   . Multiple food allergies   . Polycystic ovarian syndrome   . Seasonal allergies   . Thyroid disease   . Ulnar nerve abnormality     PAST SURGICAL HISTORY: Past Surgical History:  Procedure Laterality Date  . OOPHORECTOMY Left   . PLANTAR FASCIECTOMY Bilateral 08/2014  . TERATOMA EXCISION Left   . TUBAL LIGATION  2008    SOCIAL HISTORY: Social History   Tobacco Use  . Smoking status: Never Smoker  . Smokeless tobacco: Never Used  Substance Use Topics  . Alcohol use: Yes    Alcohol/week: 1.0 standard drinks    Types: 1 Glasses of wine per week    Comment: 1-4/month  . Drug use: No    FAMILY HISTORY: Family History  Problem Relation Age of Onset  . Thyroid disease Mother   . Allergies Mother   . Hypertension Father   . Heart disease Father   . Cancer Father        skin  . Sleep apnea Father   . Obesity Father   . Alcohol abuse Maternal Grandfather   . Hypertension Paternal Grandmother   . Hypertension Paternal Grandfather   . Diabetes Paternal Grandfather   . Hypertension Brother   . Heart Problems Brother     ROS: Review of Systems  Constitutional: Positive for weight loss.    PHYSICAL EXAM: Blood  pressure (!) 134/91, pulse 86, temperature 97.8 F (36.6 C), temperature source Oral, height 5\' 5"  (1.651 m), weight 236 lb (107 kg), SpO2 96 %. Body mass index is 39.27 kg/m. Physical Exam Vitals signs reviewed.  Constitutional:      Appearance: Normal appearance. She is obese.  Cardiovascular:     Rate and Rhythm: Normal rate.     Pulses: Normal pulses.  Pulmonary:     Effort: Pulmonary effort is normal.     Breath sounds: Normal breath sounds.  Musculoskeletal: Normal range of motion.  Skin:    General: Skin is warm and dry.  Neurological:     Mental Status: She is alert and oriented to person, place, and time.  Psychiatric:        Mood and Affect: Mood normal.        Behavior: Behavior normal.     RECENT LABS AND TESTS: BMET    Component Value Date/Time   NA 134 06/28/2018 1037   K 4.6 06/28/2018 1037   CL 100 06/28/2018 1037   CO2 22 06/28/2018 1037   GLUCOSE 86 06/28/2018 1037   GLUCOSE 80 02/10/2016 1735   BUN 18 06/28/2018 1037   CREATININE 0.78 06/28/2018 1037   CREATININE 0.67 02/10/2016 1735   CALCIUM 9.9 06/28/2018 1037   GFRNONAA 93 06/28/2018 1037   GFRAA 107 06/28/2018 1037   Lab Results  Component Value Date   HGBA1C 5.2 06/28/2018   HGBA1C 4.7 09/27/2013   Lab Results  Component Value Date   INSULIN 14.2 06/28/2018   CBC    Component Value Date/Time   WBC 11.6 (H) 06/28/2018 1037   WBC 11.6 (H) 02/03/2018 1620   RBC 4.85 06/28/2018 1037   RBC 4.72 02/03/2018 1620   HGB 14.8 06/28/2018 1037   HCT 43.8 06/28/2018 1037   PLT 375.0 02/03/2018 1620   PLT 435 (H) 10/18/2017 1640   MCV 90 06/28/2018 1037   MCH 30.5 06/28/2018 1037   MCH 31.3 02/04/2015 1109   MCHC 33.8 06/28/2018 1037   MCHC 34.1 02/03/2018 1620   RDW 13.2 06/28/2018 1037   LYMPHSABS 1.9 06/28/2018 1037   MONOABS 0.7 02/03/2018 1620   EOSABS 0.3 06/28/2018 1037   BASOSABS 0.1 06/28/2018 1037   Iron/TIBC/Ferritin/ %Sat No results found for: IRON, TIBC, FERRITIN,  IRONPCTSAT Lipid Panel     Component Value Date/Time   CHOL 181 06/28/2018 1037   TRIG 154 (H) 06/28/2018 1037   HDL 54 06/28/2018 1037   CHOLHDL 3.1 10/18/2017 1640   CHOLHDL 2.7 02/04/2015 1109   VLDL 23 02/04/2015 1109   LDLCALC 96 06/28/2018 1037   Hepatic Function Panel     Component Value Date/Time   PROT 7.4 06/28/2018 1037   ALBUMIN 4.4 06/28/2018 1037   AST 16 06/28/2018 1037   ALT 28 06/28/2018 1037   ALKPHOS 100 06/28/2018 1037   BILITOT 0.2 06/28/2018 1037      Component Value Date/Time   TSH 1.710 06/28/2018 1037   TSH 2.930 10/18/2017 1640   TSH 3.010 08/17/2016 1650      OBESITY BEHAVIORAL INTERVENTION VISIT  Today's visit was # 8   Starting weight: 240 lbs Starting date: 06/28/18 Today's weight : 236 lbs Today's date: 11/01/2018 Total lbs lost to date: 4    11/01/2018  Height 5\' 5"  (1.651 m)  Weight 236 lb (107 kg)  BMI (Calculated) 39.27  BLOOD PRESSURE - SYSTOLIC 295  BLOOD PRESSURE - DIASTOLIC 91   Body Fat % 46 %  Total Body Water (lbs) 88 lbs     ASK: We discussed the diagnosis of obesity with Erin Warren today and Erin Warren agreed to give [name] permission to discuss obesity behavioral modification therapy today.  ASSESS: Chenay has the diagnosis of obesity and her BMI today is 39.27 Margarethe is in the action stage of change   ADVISE: Kennette was educated on the multiple health risks of obesity as well as the benefit of weight loss to improve her health. She was advised of the need for long term treatment and the importance of lifestyle modifications to improve her current health and to decrease her risk of future health problems.  AGREE: Multiple dietary modification options and treatment options  were discussed and  Antwonette agreed to follow the recommendations documented in the above note.  ARRANGE: Mishika was educated on the importance of frequent visits to treat obesity as outlined per CMS and USPSTF guidelines and agreed to schedule her next  follow up appointment today.  Wilhemena Durie, am acting as transcriptionist for [employer], DO  I have reviewed the above documentation for accuracy and completeness, and I agree with the above. -Jearld Lesch, DO

## 2018-11-14 ENCOUNTER — Encounter (INDEPENDENT_AMBULATORY_CARE_PROVIDER_SITE_OTHER): Payer: Self-pay | Admitting: Bariatrics

## 2018-11-14 ENCOUNTER — Ambulatory Visit (INDEPENDENT_AMBULATORY_CARE_PROVIDER_SITE_OTHER): Payer: 59 | Admitting: Bariatrics

## 2018-11-14 VITALS — BP 124/85 | HR 75 | Temp 97.6°F | Ht 65.0 in | Wt 235.0 lb

## 2018-11-14 DIAGNOSIS — E781 Pure hyperglyceridemia: Secondary | ICD-10-CM

## 2018-11-14 DIAGNOSIS — Z9189 Other specified personal risk factors, not elsewhere classified: Secondary | ICD-10-CM | POA: Diagnosis not present

## 2018-11-14 DIAGNOSIS — E8881 Metabolic syndrome: Secondary | ICD-10-CM

## 2018-11-14 DIAGNOSIS — Z6839 Body mass index (BMI) 39.0-39.9, adult: Secondary | ICD-10-CM

## 2018-11-14 NOTE — Progress Notes (Signed)
Office: [phone]  /  Fax: [phone]   HPI:   Chief Complaint: OBESITY Erin Warren is here to discuss her progress with her obesity treatment plan. She is on the keep a food journal with 1350 calories and 80 to 85 grams of protein daily plan and she is following her eating plan approximately 60 % of the time. She states she is exercising 0 minutes 0 times per week. Erin Warren is slowly losing weight. Erin Warren ate out more this week. She is eating about 85 to 100 grams of protein daily. Her appetite is better controlled. Her weight is 235 lb (106.6 kg) today and has had a weight loss of 1 pound over a period of 2 weeks since her last visit. She has lost 5 lbs since starting treatment with [name].  Insulin Resistance Erin Warren has a diagnosis of insulin resistance based on her elevated fasting insulin level >5. Although Erin Warren's blood glucose readings are still under good control, insulin resistance puts her at greater risk of metabolic syndrome and diabetes. She is taking metformin currently and continues to work on diet and exercise to decrease risk of diabetes. She denies polyphagia.  At risk for diabetes [name] is at higher than average risk for developing diabetes due to her obesity and insulin resistance. She currently denies polyuria or polydipsia.  Elevated Triglycerides [name] has elevated triglycerides. She has been trying to improve her triglycerides with intensive lifestyle modification including a low saturated fat diet, exercise and weight loss. She denies abdominal pain.  ASSESSMENT AND PLAN:  Insulin resistance - Plan: Comprehensive metabolic panel, Hemoglobin A1c, Insulin, random  Hypertriglyceridemia - Plan: Lipid Panel With LDL/HDL Ratio  At risk for diabetes mellitus  Class 2 severe obesity with serious comorbidity and body mass index (BMI) of 39.0 to 39.9 in adult, unspecified obesity type (HCC)  PLAN:  Insulin Resistance [name] will continue to work on weight loss, exercise, and  decreasing simple carbohydrates in her diet to help decrease the risk of diabetes. We dicussed metformin including benefits and risks. She was informed that eating too many simple carbohydrates or too many calories at one sitting increases the likelihood of GI side effects. Erin Warren will continue metformin for now and prescription was not written today. We will check Hgb A1c and insulin level and Ashlye agreed to follow up with [name] as directed to monitor her progress.  Diabetes risk counseling Erin Warren was given extended (15 minutes) diabetes prevention counseling today. She is 43 y.o. female and has risk factors for diabetes including obesity and insulin resistance. We discussed intensive lifestyle modifications today with an emphasis on weight loss as well as increasing exercise and decreasing simple carbohydrates in her diet.  Elevated Triglycerides Erin Warren was informed of the American Heart Association Guidelines emphasizing intensive lifestyle modifications as the first line treatment for elevated triglycerides. We discussed many lifestyle modifications today in depth, and Erin Warren will continue to work on decreasing simple carbohydrates, saturated fats such as fatty red meat, butter and many fried foods. She will also increase vegetables and lean protein in her diet and continue to work on exercise and weight loss efforts. We will check lipid panel today and [name] will follow up at the agreed upon time.  Obesity Erin Warren is currently in the action stage of change. As such, her goal is to continue with weight loss efforts She has agreed to keep a food journal with 1350 calories and 80 to 85 grams of protein daily Erin Warren has been instructed to work up to a  goal of 150 minutes of combined cardio and strengthening exercise per week for weight loss and overall health benefits. We discussed the following Behavioral Modification Strategies today: increase H2O intake, no skipping meals, keeping healthy foods in the home,  increasing lean protein intake, decreasing simple carbohydrates, increasing vegetables, decrease eating out and work on meal planning and easy cooking plans  Erin Warren has agreed to follow up with our clinic in 2 weeks. She was informed of the importance of frequent follow up visits to maximize her success with intensive lifestyle modifications for her multiple health conditions.  ALLERGIES: Allergies  Allergen Reactions  . Food Hives, Nausea Only and Swelling    WHEAT  . Garlic   . Onion   . Topamax [Topiramate] Other (See Comments)    Flushing, paresthesias (tingling, burning).      MEDICATIONS: Current Outpatient Medications on File Prior to Visit  Medication Sig Dispense Refill  . albuterol (PROVENTIL HFA;VENTOLIN HFA) 108 (90 BASE) MCG/ACT inhaler Inhale 2 puffs into the lungs every 6 (six) hours as needed. 1 Inhaler 3  . Cholecalciferol (VITAMIN D) 2000 UNITS tablet Take 2,000 Units by mouth daily.    [name] EPIPEN 2-PAK 0.3 MG/0.3ML SOAJ injection as needed.    . fluticasone (FLONASE) 50 MCG/ACT nasal spray SPRAY 2 SPRAYS INTO EACH NOSTRIL EVERY DAY 16 g 2  . levothyroxine (SYNTHROID, LEVOTHROID) 25 MCG tablet Take 1 tablet (25 mcg total) by mouth daily. 90 tablet 3  . loratadine (CLARITIN) 10 MG tablet Take 10 mg by mouth daily.    . metFORMIN (GLUCOPHAGE) 500 MG tablet TAKE 1 TABLET BY MOUTH TWICE A DAY 60 tablet 0  . propranolol ER (INDERAL LA) 80 MG 24 hr capsule Take 1 capsule (80 mg total) by mouth daily. 90 capsule 4  . QVAR REDIHALER 80 MCG/ACT inhaler TAKE 1 PUFF BY MOUTH TWICE A DAY  5  . ranitidine (ZANTAC) 150 MG tablet Take 1 tablet (150 mg total) by mouth 2 (two) times daily. No more refills without office visit 180 tablet 0  . rizatriptan (MAXALT-MLT) 10 MG disintegrating tablet Take 1 tablet (10 mg total) by mouth as needed for migraine. May repeat in 2 hours if needed 9 tablet 12   No current facility-administered medications on file prior to visit.     PAST MEDICAL  HISTORY: Past Medical History:  Diagnosis Date  . AC (acromioclavicular) joint bone spurs   . Allergy   . Asthma   . Bunion   . Edema, lower extremity   . GERD (gastroesophageal reflux disease)   . Heel spur    Both heels, one has fractured off  . Hypertension   . Hypothyroidism   . Migraines   . Multiple food allergies   . Polycystic ovarian syndrome   . Seasonal allergies   . Thyroid disease   . Ulnar nerve abnormality     PAST SURGICAL HISTORY: Past Surgical History:  Procedure Laterality Date  . OOPHORECTOMY Left   . PLANTAR FASCIECTOMY Bilateral 08/2014  . TERATOMA EXCISION Left   . TUBAL LIGATION  2008    SOCIAL HISTORY: Social History   Tobacco Use  . Smoking status: Never Smoker  . Smokeless tobacco: Never Used  Substance Use Topics  . Alcohol use: Yes    Alcohol/week: 1.0 standard drinks    Types: 1 Glasses of wine per week    Comment: 1-4/month  . Drug use: No    FAMILY HISTORY: Family History  Problem Relation Age of Onset  .  Thyroid disease Mother   . Allergies Mother   . Hypertension Father   . Heart disease Father   . Cancer Father        skin  . Sleep apnea Father   . Obesity Father   . Alcohol abuse Maternal Grandfather   . Hypertension Paternal Grandmother   . Hypertension Paternal Grandfather   . Diabetes Paternal Grandfather   . Hypertension Brother   . Heart Problems Brother     ROS: Review of Systems  Constitutional: Positive for weight loss.  Gastrointestinal: Negative for abdominal pain.  Genitourinary: Negative for frequency.  Endo/Heme/Allergies: Negative for polydipsia.       Negative for polyphagia    PHYSICAL EXAM: Blood pressure 124/85, pulse 75, temperature 97.6 F (36.4 C), temperature source Oral, height 5\' 5"  (1.651 m), weight 235 lb (106.6 kg), last menstrual period 11/03/2018, SpO2 97 %. Body mass index is 39.11 kg/m. Physical Exam Vitals signs reviewed.  Constitutional:      Appearance: Normal  appearance. She is well-developed. She is obese.  Cardiovascular:     Rate and Rhythm: Normal rate.  Pulmonary:     Effort: Pulmonary effort is normal.  Musculoskeletal: Normal range of motion.  Skin:    General: Skin is warm and dry.  Neurological:     Mental Status: She is alert and oriented to person, place, and time.  Psychiatric:        Mood and Affect: Mood normal.        Behavior: Behavior normal.     RECENT LABS AND TESTS: BMET    Component Value Date/Time   NA 134 06/28/2018 1037   K 4.6 06/28/2018 1037   CL 100 06/28/2018 1037   CO2 22 06/28/2018 1037   GLUCOSE 86 06/28/2018 1037   GLUCOSE 80 02/10/2016 1735   BUN 18 06/28/2018 1037   CREATININE 0.78 06/28/2018 1037   CREATININE 0.67 02/10/2016 1735   CALCIUM 9.9 06/28/2018 1037   GFRNONAA 93 06/28/2018 1037   GFRAA 107 06/28/2018 1037   Lab Results  Component Value Date   HGBA1C 5.2 06/28/2018   HGBA1C 4.7 09/27/2013   Lab Results  Component Value Date   INSULIN 14.2 06/28/2018   CBC    Component Value Date/Time   WBC 11.6 (H) 06/28/2018 1037   WBC 11.6 (H) 02/03/2018 1620   RBC 4.85 06/28/2018 1037   RBC 4.72 02/03/2018 1620   HGB 14.8 06/28/2018 1037   HCT 43.8 06/28/2018 1037   PLT 375.0 02/03/2018 1620   PLT 435 (H) 10/18/2017 1640   MCV 90 06/28/2018 1037   MCH 30.5 06/28/2018 1037   MCH 31.3 02/04/2015 1109   MCHC 33.8 06/28/2018 1037   MCHC 34.1 02/03/2018 1620   RDW 13.2 06/28/2018 1037   LYMPHSABS 1.9 06/28/2018 1037   MONOABS 0.7 02/03/2018 1620   EOSABS 0.3 06/28/2018 1037   BASOSABS 0.1 06/28/2018 1037   Iron/TIBC/Ferritin/ %Sat No results found for: IRON, TIBC, FERRITIN, IRONPCTSAT Lipid Panel     Component Value Date/Time   CHOL 181 06/28/2018 1037   TRIG 154 (H) 06/28/2018 1037   HDL 54 06/28/2018 1037   CHOLHDL 3.1 10/18/2017 1640   CHOLHDL 2.7 02/04/2015 1109   VLDL 23 02/04/2015 1109   LDLCALC 96 06/28/2018 1037   Hepatic Function Panel     Component Value  Date/Time   PROT 7.4 06/28/2018 1037   ALBUMIN 4.4 06/28/2018 1037   AST 16 06/28/2018 1037   ALT 28 06/28/2018 1037  ALKPHOS 100 06/28/2018 1037   BILITOT 0.2 06/28/2018 1037      Component Value Date/Time   TSH 1.710 06/28/2018 1037   TSH 2.930 10/18/2017 1640   TSH 3.010 08/17/2016 1650   Results for RAYLEI, LOSURDO (MRN [id number]) as of 11/14/2018 15:48  Ref. Range 06/28/2018 10:37  Vitamin D, 25-Hydroxy Latest Ref Range: 30.0 - 100.0 ng/mL 47.5     OBESITY BEHAVIORAL INTERVENTION VISIT  Today's visit was # 9   Starting weight: 240 lbs Starting date: 06/28/2018 Today's weight : 235 lbs Today's date: 11/14/2018 Total lbs lost to date: 5    11/14/2018  Height 5\' 5"  (1.651 m)  Weight 235 lb (106.6 kg)  BMI (Calculated) 39.11  BLOOD PRESSURE - SYSTOLIC 941  BLOOD PRESSURE - DIASTOLIC 85   Body Fat % 74.0 %  Total Body Water (lbs) 82.8 lbs    ASK: We discussed the diagnosis of obesity with [name] today and Missi agreed to give [name] permission to discuss obesity behavioral modification therapy today.  ASSESS: Kawanda has the diagnosis of obesity and her BMI today is 39.11 Kazia is in the action stage of change   ADVISE: Chaka was educated on the multiple health risks of obesity as well as the benefit of weight loss to improve her health. She was advised of the need for long term treatment and the importance of lifestyle modifications to improve her current health and to decrease her risk of future health problems.  AGREE: Multiple dietary modification options and treatment options were discussed and  Dannette agreed to follow the recommendations documented in the above note.  ARRANGE: Ashleah was educated on the importance of frequent visits to treat obesity as outlined per CMS and USPSTF guidelines and agreed to schedule her next follow up appointment today.  Corey Skains, am acting as Location manager for [employer]. Owens Shark, DO  I have reviewed the above  documentation for accuracy and completeness, and I agree with the above. -Jearld Lesch, DO

## 2018-11-15 LAB — COMPREHENSIVE METABOLIC PANEL
A/G RATIO: 1.2 (ref 1.2–2.2)
ALT: 22 IU/L (ref 0–32)
AST: 16 IU/L (ref 0–40)
Albumin: 3.9 g/dL (ref 3.8–4.8)
Alkaline Phosphatase: 96 IU/L (ref 39–117)
BUN/Creatinine Ratio: 18 (ref 9–23)
BUN: 15 mg/dL (ref 6–24)
Bilirubin Total: <0.2 mg/dL (ref 0.0–1.2)
CALCIUM: 9.3 mg/dL (ref 8.7–10.2)
CO2: 19 mmol/L — ABNORMAL LOW (ref 20–29)
Chloride: 103 mmol/L (ref 96–106)
Creatinine, Ser: 0.85 mg/dL (ref 0.57–1.00)
GFR calc non Af Amer: 84 mL/min/{1.73_m2} (ref >59)
GFR, EST AFRICAN AMERICAN: 96 mL/min/{1.73_m2} (ref >59)
GLOBULIN, TOTAL: 3.2 g/dL (ref 1.5–4.5)
Glucose: 86 mg/dL (ref 65–99)
POTASSIUM: 4.7 mmol/L (ref 3.5–5.2)
Sodium: 138 mmol/L (ref 134–144)
Total Protein: 7.1 g/dL (ref 6.0–8.5)

## 2018-11-15 LAB — LIPID PANEL WITH LDL/HDL RATIO
Cholesterol, Total: 140 mg/dL (ref 100–199)
HDL: 45 mg/dL (ref >39)
LDL Calculated: 55 mg/dL (ref 0–99)
LDl/HDL Ratio: 1.2 ratio (ref 0.0–3.2)
Triglycerides: 201 mg/dL — ABNORMAL HIGH (ref 0–149)
VLDL Cholesterol Cal: 40 mg/dL (ref 5–40)

## 2018-11-15 LAB — HEMOGLOBIN A1C
Est. average glucose Bld gHb Est-mCnc: 100 mg/dL
Hgb A1c MFr Bld: 5.1 % (ref 4.8–5.6)

## 2018-11-15 LAB — INSULIN, RANDOM: INSULIN: 20.4 u[IU]/mL (ref 2.6–24.9)

## 2018-11-29 ENCOUNTER — Encounter (INDEPENDENT_AMBULATORY_CARE_PROVIDER_SITE_OTHER): Payer: Self-pay

## 2018-11-30 ENCOUNTER — Encounter (INDEPENDENT_AMBULATORY_CARE_PROVIDER_SITE_OTHER): Payer: Self-pay | Admitting: Bariatrics

## 2018-11-30 ENCOUNTER — Ambulatory Visit (INDEPENDENT_AMBULATORY_CARE_PROVIDER_SITE_OTHER): Payer: 59 | Admitting: Bariatrics

## 2018-11-30 ENCOUNTER — Other Ambulatory Visit: Payer: Self-pay

## 2018-11-30 DIAGNOSIS — Z6839 Body mass index (BMI) 39.0-39.9, adult: Secondary | ICD-10-CM | POA: Diagnosis not present

## 2018-11-30 DIAGNOSIS — E781 Pure hyperglyceridemia: Secondary | ICD-10-CM | POA: Diagnosis not present

## 2018-11-30 DIAGNOSIS — E8881 Metabolic syndrome: Secondary | ICD-10-CM

## 2018-12-07 NOTE — Progress Notes (Addendum)
Office: [phone]  /  Fax: [phone] TeleHealth Visit:  Erin Warren has verbally consented to this TeleHealth visit today. The patient is located at home, the provider is located at the [facility] office. The participants in this visit include the listed provider and patient and any and all parties involved. The visit was conducted today via telephone call. Erin Warren was unable to use realtime audiovisual technology today and the telehealth visit was conducted via telephone. The total time spent was 15 minutes.   HPI:   Chief Complaint: OBESITY Erin Warren is here to discuss her progress with her obesity treatment plan. She is on the keep a food journal with 1350 calories and 80 to 85 grams of protein daily plan and is following her eating plan approximately 75 % of the time. She states she is exercising 0 minutes 0 times per week. Theo believes that she has lost several pounds. She is working from home. [name] has struggled with chicken and meats. She has not been journaling. She may have a scale at home. [name] has not had any inappropriate hunger. We were unable to weigh the patient today for this TeleHealth visit. She feels as if she has lost weight since her last visit. She has lost 5 lbs since starting treatment with [name].  Insulin Resistance Erin Warren has a diagnosis of insulin resistance based on her elevated fasting insulin level >5. Although [name]'s blood glucose readings are still under good control, insulin resistance puts her at greater risk of metabolic syndrome and diabetes. Her last A1c was at 5.1 and last insulin level was at 20.4 She continues to work on diet and exercise to decrease risk of diabetes. Erin Warren denies polyphagia.  Hypertriglyceridemia Erin Warren has hypertriglyceridemia and her last triglyceride level was at 201. She has been trying to improve her cholesterol levels with intensive lifestyle modification including a low saturated fat diet, exercise and weight loss.  She denies myalgias.  ASSESSMENT AND PLAN:  Insulin resistance  Hypertriglyceridemia  Class 2 severe obesity with serious comorbidity and body mass index (BMI) of 39.0 to 39.9 in adult, unspecified obesity type ([name])  PLAN:  Insulin Resistance Erin Warren will continue to work on weight loss, exercise, increasing lean protein and decreasing simple carbohydrates in her diet to help decrease the risk of diabetes. She was informed that eating too many simple carbohydrates or too many calories at one sitting increases the likelihood of GI side effects. Erin Warren agreed to follow up with [name] as directed to monitor her progress.  Hypertriglyceridemia Erin Warren was informed of the American Heart Association Guidelines emphasizing intensive lifestyle modifications as the first line treatment for hypertriglyceridemia. We discussed many lifestyle modifications today in depth, and Erin Warren will continue to work on decreasing simple carbohydrates, saturated fats such as fatty red meat, butter and many fried foods. She will also increase vegetables and lean protein in her diet and continue to work on exercise and weight loss efforts.  Obesity [name] is currently in the action stage of change. As such, her goal is to continue with weight loss efforts She has agreed to portion control better and make smarter food choices, such as increase vegetables and decrease simple carbohydrates  and keep a food journal with 1350 calories and 80 to 85 grams of protein daily [name] will exercise with video for 20 minutes 5 times per week for weight loss and overall health benefits. We discussed the following Behavioral Modification Strategies today: increase H2O intake, no skipping meals (will not skip  meals), keeping healthy foods in the home, better snacking choices, increasing lean protein intake, decreasing simple carbohydrates, increasing vegetables, decrease eating out, work on meal planning and easy cooking plans and emotional eating  strategies  Erin Warren has agreed to follow up with our clinic in 2 weeks. She was informed of the importance of frequent follow up visits to maximize her success with intensive lifestyle modifications for her multiple health conditions.  ALLERGIES: Allergies  Allergen Reactions   Food Hives, Nausea Only and Swelling    WHEAT   Garlic    Onion    Topamax [Topiramate] Other (See Comments)    Flushing, paresthesias (tingling, burning).      MEDICATIONS: Current Outpatient Medications on File Prior to Visit  Medication Sig Dispense Refill   albuterol (PROVENTIL HFA;VENTOLIN HFA) 108 (90 BASE) MCG/ACT inhaler Inhale 2 puffs into the lungs every 6 (six) hours as needed. 1 Inhaler 3   Cholecalciferol (VITAMIN D) 2000 UNITS tablet Take 2,000 Units by mouth daily.     EPIPEN 2-PAK 0.3 MG/0.3ML SOAJ injection as needed.     fluticasone (FLONASE) 50 MCG/ACT nasal spray SPRAY 2 SPRAYS INTO EACH NOSTRIL EVERY DAY 16 g 2   levothyroxine (SYNTHROID, LEVOTHROID) 25 MCG tablet Take 1 tablet (25 mcg total) by mouth daily. 90 tablet 3   loratadine (CLARITIN) 10 MG tablet Take 10 mg by mouth daily.     metFORMIN (GLUCOPHAGE) 500 MG tablet TAKE 1 TABLET BY MOUTH TWICE A DAY 60 tablet 0   propranolol ER (INDERAL LA) 80 MG 24 hr capsule Take 1 capsule (80 mg total) by mouth daily. 90 capsule 4   QVAR REDIHALER 80 MCG/ACT inhaler TAKE 1 PUFF BY MOUTH TWICE A DAY  5   ranitidine (ZANTAC) 150 MG tablet Take 1 tablet (150 mg total) by mouth 2 (two) times daily. No more refills without office visit 180 tablet 0   rizatriptan (MAXALT-MLT) 10 MG disintegrating tablet Take 1 tablet (10 mg total) by mouth as needed for migraine. May repeat in 2 hours if needed 9 tablet 12   No current facility-administered medications on file prior to visit.     PAST MEDICAL HISTORY: Past Medical History:  Diagnosis Date   AC (acromioclavicular) joint bone spurs    Allergy    Asthma    Bunion    Edema, lower  extremity    GERD (gastroesophageal reflux disease)    Heel spur    Both heels, one has fractured off   Hypertension    Hypothyroidism    Migraines    Multiple food allergies    Polycystic ovarian syndrome    Seasonal allergies    Thyroid disease    Ulnar nerve abnormality     PAST SURGICAL HISTORY: Past Surgical History:  Procedure Laterality Date   OOPHORECTOMY Left    PLANTAR FASCIECTOMY Bilateral 08/2014   TERATOMA EXCISION Left    TUBAL LIGATION  2008    SOCIAL HISTORY: Social History   Tobacco Use   Smoking status: Never Smoker   Smokeless tobacco: Never Used  Substance Use Topics   Alcohol use: Yes    Alcohol/week: 1.0 standard drinks    Types: 1 Glasses of wine per week    Comment: 1-4/month   Drug use: No    FAMILY HISTORY: Family History  Problem Relation Age of Onset   Thyroid disease Mother    Allergies Mother    Hypertension Father    Heart disease Father    Cancer Father  skin   Sleep apnea Father    Obesity Father    Alcohol abuse Maternal Grandfather    Hypertension Paternal Grandmother    Hypertension Paternal Grandfather    Diabetes Paternal Grandfather    Hypertension Brother    Heart Problems Brother     ROS: Review of Systems  Constitutional: Positive for weight loss.  Musculoskeletal: Negative for myalgias.  Endo/Heme/Allergies:       Negative for polyphagia    PHYSICAL EXAM: Pt in no acute distress  RECENT LABS AND TESTS: BMET    Component Value Date/Time   NA 138 11/14/2018 0831   K 4.7 11/14/2018 0831   CL 103 11/14/2018 0831   CO2 19 (L) 11/14/2018 0831   GLUCOSE 86 11/14/2018 0831   GLUCOSE 80 02/10/2016 1735   BUN 15 11/14/2018 0831   CREATININE 0.85 11/14/2018 0831   CREATININE 0.67 02/10/2016 1735   CALCIUM 9.3 11/14/2018 0831   GFRNONAA 84 11/14/2018 0831   GFRAA 96 11/14/2018 0831   Lab Results  Component Value Date   HGBA1C 5.1 11/14/2018   HGBA1C 5.2 06/28/2018    HGBA1C 4.7 09/27/2013   Lab Results  Component Value Date   INSULIN 20.4 11/14/2018   INSULIN 14.2 06/28/2018   CBC    Component Value Date/Time   WBC 11.6 (H) 06/28/2018 1037   WBC 11.6 (H) 02/03/2018 1620   RBC 4.85 06/28/2018 1037   RBC 4.72 02/03/2018 1620   HGB 14.8 06/28/2018 1037   HCT 43.8 06/28/2018 1037   PLT 375.0 02/03/2018 1620   PLT 435 (H) 10/18/2017 1640   MCV 90 06/28/2018 1037   MCH 30.5 06/28/2018 1037   MCH 31.3 02/04/2015 1109   MCHC 33.8 06/28/2018 1037   MCHC 34.1 02/03/2018 1620   RDW 13.2 06/28/2018 1037   LYMPHSABS 1.9 06/28/2018 1037   MONOABS 0.7 02/03/2018 1620   EOSABS 0.3 06/28/2018 1037   BASOSABS 0.1 06/28/2018 1037   Iron/TIBC/Ferritin/ %Sat No results found for: IRON, TIBC, FERRITIN, IRONPCTSAT Lipid Panel     Component Value Date/Time   CHOL 140 11/14/2018 0831   TRIG 201 (H) 11/14/2018 0831   HDL 45 11/14/2018 0831   CHOLHDL 3.1 10/18/2017 1640   CHOLHDL 2.7 02/04/2015 1109   VLDL 23 02/04/2015 1109   LDLCALC 55 11/14/2018 0831   Hepatic Function Panel     Component Value Date/Time   PROT 7.1 11/14/2018 0831   ALBUMIN 3.9 11/14/2018 0831   AST 16 11/14/2018 0831   ALT 22 11/14/2018 0831   ALKPHOS 96 11/14/2018 0831   BILITOT <0.2 11/14/2018 0831      Component Value Date/Time   TSH 1.710 06/28/2018 1037   TSH 2.930 10/18/2017 1640   TSH 3.010 08/17/2016 1650     Ref. Range 06/28/2018 10:37  Vitamin D, 25-Hydroxy Latest Ref Range: 30.0 - 100.0 ng/mL 47.5     I, Doreene Nest, am acting as Location manager for [employer]. Owens Shark, DO  I have reviewed the above documentation for accuracy and completeness, and I agree with the above. -Jearld Lesch, DO

## 2018-12-13 ENCOUNTER — Ambulatory Visit (INDEPENDENT_AMBULATORY_CARE_PROVIDER_SITE_OTHER): Payer: 59 | Admitting: Bariatrics

## 2018-12-13 ENCOUNTER — Other Ambulatory Visit: Payer: Self-pay

## 2018-12-13 ENCOUNTER — Encounter (INDEPENDENT_AMBULATORY_CARE_PROVIDER_SITE_OTHER): Payer: Self-pay | Admitting: Bariatrics

## 2018-12-13 DIAGNOSIS — Z6839 Body mass index (BMI) 39.0-39.9, adult: Secondary | ICD-10-CM

## 2018-12-13 DIAGNOSIS — E8881 Metabolic syndrome: Secondary | ICD-10-CM | POA: Diagnosis not present

## 2018-12-13 DIAGNOSIS — E781 Pure hyperglyceridemia: Secondary | ICD-10-CM

## 2018-12-13 NOTE — Progress Notes (Signed)
Office: [phone]  /  Fax: [phone] TeleHealth Visit:  Erin Warren has verbally consented to this TeleHealth visit today. The patient is located at home, the provider is located at the [facility] office. The participants in this visit include the listed provider and patient and any and all parties involved. The visit was conducted today via telephone. Erin Warren was unable to use realtime audiovisual technology today and the telehealth visit was conducted via telephone.  HPI:   Chief Complaint: OBESITY Erin Warren is here to discuss her progress with her obesity treatment plan. She is on the keep a food journal with 1350 calories and 80 to 85 grams of protein daily and is following her eating plan approximately 70 % of the time. She states she is doing Tai Chi for 15 to 20 minutes 5 times per week. Erin Warren thinks that she is the same weight. She has had several bad days (decreased sleep, increased carbohydrates). Erin Warren has done some minor stress eating. We were unable to weigh the patient today for this TeleHealth visit. She feels as if she has maintained weight since her last visit. She has lost 5 lbs since starting treatment with [name].  Insulin Resistance Debrina has a diagnosis of insulin resistance based on her elevated fasting insulin level >5. Although Erin Warren's blood glucose readings are still under good control, insulin resistance puts her at greater risk of metabolic syndrome and diabetes. Her last A1c was at 5.1 and last insulin level was at 20.4 She is taking metformin currently and continues to work on diet and exercise to decrease risk of diabetes.  Elevated Triglycerides Erin Warren has elevated triglycerides and she has been trying to improve her triglyceride levels with intensive lifestyle modification including a low saturated fat diet, exercise and weight loss. She denies any chest pain or myalgias.  ASSESSMENT AND PLAN:  Insulin resistance  Hypertriglyceridemia  Class 2  severe obesity with serious comorbidity and body mass index (BMI) of 39.0 to 39.9 in adult, unspecified obesity type ([place])  PLAN:  Insulin Resistance Erin Warren will continue to work on weight loss, exercise, increasing lean protein and decreasing simple carbohydrates in her diet to help decrease the risk of diabetes. She was informed that eating too many simple carbohydrates or too many calories at one sitting increases the likelihood of GI side effects. [name] agreed to follow up with [name] as directed to monitor her progress.  Elevated Triglycerides Erin Warren was informed of the American Heart Association Guidelines emphasizing intensive lifestyle modifications as the first line treatment for elevated triglycerides. We discussed many lifestyle modifications today in depth, and [name] will continue to work on decreasing saturated fats such as fatty red meat, butter and many fried foods. She will also increase vegetables and lean protein in her diet and continue to work on exercise and weight loss efforts. We will continue to monitor and Erin Warren will follow up at the agreed upon time.  Obesity Erin Warren is currently in the action stage of change. As such, her goal is to continue with weight loss efforts She has agreed to keep a food journal with 1350 calories and 80 to 85 grams of protein daily Erin Warren will continue Tai Chi for weight loss and overall health benefits. We discussed the following Behavioral Modification Strategies today: increase H2O intake, no skipping meals, keeping healthy foods in the home, increasing lean protein intake, decreasing simple carbohydrates, increasing vegetables, decrease eating out and work on meal planning and easy cooking plans  Erin Warren has agreed  to follow up with our clinic in 2 weeks. She was informed of the importance of frequent follow up visits to maximize her success with intensive lifestyle modifications for her multiple health conditions.  ALLERGIES: Allergies  Allergen  Reactions  . Food Hives, Nausea Only and Swelling    WHEAT  . Garlic   . Onion   . Topamax [Topiramate] Other (See Comments)    Flushing, paresthesias (tingling, burning).      MEDICATIONS: Current Outpatient Medications on File Prior to Visit  Medication Sig Dispense Refill  . albuterol (PROVENTIL HFA;VENTOLIN HFA) 108 (90 BASE) MCG/ACT inhaler Inhale 2 puffs into the lungs every 6 (six) hours as needed. 1 Inhaler 3  . Cholecalciferol (VITAMIN D) 2000 UNITS tablet Take 2,000 Units by mouth daily.    [name] EPIPEN 2-PAK 0.3 MG/0.3ML SOAJ injection as needed.    . fluticasone (FLONASE) 50 MCG/ACT nasal spray SPRAY 2 SPRAYS INTO EACH NOSTRIL EVERY DAY 16 g 2  . levothyroxine (SYNTHROID, LEVOTHROID) 25 MCG tablet Take 1 tablet (25 mcg total) by mouth daily. 90 tablet 3  . loratadine (CLARITIN) 10 MG tablet Take 10 mg by mouth daily.    . metFORMIN (GLUCOPHAGE) 500 MG tablet TAKE 1 TABLET BY MOUTH TWICE A DAY 60 tablet 0  . propranolol ER (INDERAL LA) 80 MG 24 hr capsule Take 1 capsule (80 mg total) by mouth daily. 90 capsule 4  . QVAR REDIHALER 80 MCG/ACT inhaler TAKE 1 PUFF BY MOUTH TWICE A DAY  5  . ranitidine (ZANTAC) 150 MG tablet Take 1 tablet (150 mg total) by mouth 2 (two) times daily. No more refills without office visit 180 tablet 0  . rizatriptan (MAXALT-MLT) 10 MG disintegrating tablet Take 1 tablet (10 mg total) by mouth as needed for migraine. May repeat in 2 hours if needed 9 tablet 12   No current facility-administered medications on file prior to visit.     PAST MEDICAL HISTORY: Past Medical History:  Diagnosis Date  . AC (acromioclavicular) joint bone spurs   . Allergy   . Asthma   . Bunion   . Edema, lower extremity   . GERD (gastroesophageal reflux disease)   . Heel spur    Both heels, one has fractured off  . Hypertension   . Hypothyroidism   . Migraines   . Multiple food allergies   . Polycystic ovarian syndrome   . Seasonal allergies   . Thyroid disease   .  Ulnar nerve abnormality     PAST SURGICAL HISTORY: Past Surgical History:  Procedure Laterality Date  . OOPHORECTOMY Left   . PLANTAR FASCIECTOMY Bilateral 08/2014  . TERATOMA EXCISION Left   . TUBAL LIGATION  2008    SOCIAL HISTORY: Social History   Tobacco Use  . Smoking status: Never Smoker  . Smokeless tobacco: Never Used  Substance Use Topics  . Alcohol use: Yes    Alcohol/week: 1.0 standard drinks    Types: 1 Glasses of wine per week    Comment: 1-4/month  . Drug use: No    FAMILY HISTORY: Family History  Problem Relation Age of Onset  . Thyroid disease Mother   . Allergies Mother   . Hypertension Father   . Heart disease Father   . Cancer Father        skin  . Sleep apnea Father   . Obesity Father   . Alcohol abuse Maternal Grandfather   . Hypertension Paternal Grandmother   . Hypertension Paternal Grandfather   .  Diabetes Paternal Grandfather   . Hypertension Brother   . Heart Problems Brother     ROS: Review of Systems  Constitutional: Negative for weight loss.  Cardiovascular: Negative for chest pain.  Musculoskeletal: Negative for myalgias.    PHYSICAL EXAM: Pt in no acute distress  RECENT LABS AND TESTS: BMET    Component Value Date/Time   NA 138 11/14/2018 0831   K 4.7 11/14/2018 0831   CL 103 11/14/2018 0831   CO2 19 (L) 11/14/2018 0831   GLUCOSE 86 11/14/2018 0831   GLUCOSE 80 02/10/2016 1735   BUN 15 11/14/2018 0831   CREATININE 0.85 11/14/2018 0831   CREATININE 0.67 02/10/2016 1735   CALCIUM 9.3 11/14/2018 0831   GFRNONAA 84 11/14/2018 0831   GFRAA 96 11/14/2018 0831   Lab Results  Component Value Date   HGBA1C 5.1 11/14/2018   HGBA1C 5.2 06/28/2018   HGBA1C 4.7 09/27/2013   Lab Results  Component Value Date   INSULIN 20.4 11/14/2018   INSULIN 14.2 06/28/2018   CBC    Component Value Date/Time   WBC 11.6 (H) 06/28/2018 1037   WBC 11.6 (H) 02/03/2018 1620   RBC 4.85 06/28/2018 1037   RBC 4.72 02/03/2018 1620    HGB 14.8 06/28/2018 1037   HCT 43.8 06/28/2018 1037   PLT 375.0 02/03/2018 1620   PLT 435 (H) 10/18/2017 1640   MCV 90 06/28/2018 1037   MCH 30.5 06/28/2018 1037   MCH 31.3 02/04/2015 1109   MCHC 33.8 06/28/2018 1037   MCHC 34.1 02/03/2018 1620   RDW 13.2 06/28/2018 1037   LYMPHSABS 1.9 06/28/2018 1037   MONOABS 0.7 02/03/2018 1620   EOSABS 0.3 06/28/2018 1037   BASOSABS 0.1 06/28/2018 1037   Iron/TIBC/Ferritin/ %Sat No results found for: IRON, TIBC, FERRITIN, IRONPCTSAT Lipid Panel     Component Value Date/Time   CHOL 140 11/14/2018 0831   TRIG 201 (H) 11/14/2018 0831   HDL 45 11/14/2018 0831   CHOLHDL 3.1 10/18/2017 1640   CHOLHDL 2.7 02/04/2015 1109   VLDL 23 02/04/2015 1109   LDLCALC 55 11/14/2018 0831   Hepatic Function Panel     Component Value Date/Time   PROT 7.1 11/14/2018 0831   ALBUMIN 3.9 11/14/2018 0831   AST 16 11/14/2018 0831   ALT 22 11/14/2018 0831   ALKPHOS 96 11/14/2018 0831   BILITOT <0.2 11/14/2018 0831      Component Value Date/Time   TSH 1.710 06/28/2018 1037   TSH 2.930 10/18/2017 1640   TSH 3.010 08/17/2016 1650    Results for WM, FRUCHTER (MRN [id number]) as of 12/13/2018 15:45  Ref. Range 06/28/2018 10:37  Vitamin D, 25-Hydroxy Latest Ref Range: 30.0 - 100.0 ng/mL 47.5    I, Doreene Nest, am acting as Location manager for [employer]. Owens Shark, DO  I have reviewed the above documentation for accuracy and completeness, and I agree with the above. -Jearld Lesch, DO

## 2018-12-27 ENCOUNTER — Encounter (INDEPENDENT_AMBULATORY_CARE_PROVIDER_SITE_OTHER): Payer: Self-pay | Admitting: Bariatrics

## 2018-12-27 ENCOUNTER — Ambulatory Visit (INDEPENDENT_AMBULATORY_CARE_PROVIDER_SITE_OTHER): Payer: 59 | Admitting: Bariatrics

## 2018-12-27 ENCOUNTER — Other Ambulatory Visit: Payer: Self-pay

## 2018-12-27 DIAGNOSIS — Z6839 Body mass index (BMI) 39.0-39.9, adult: Secondary | ICD-10-CM | POA: Diagnosis not present

## 2018-12-27 DIAGNOSIS — I10 Essential (primary) hypertension: Secondary | ICD-10-CM

## 2018-12-27 DIAGNOSIS — E8881 Metabolic syndrome: Secondary | ICD-10-CM

## 2018-12-28 NOTE — Progress Notes (Signed)
Office: [phone]  /  Fax: [phone] TeleHealth Visit:  Erin Warren has verbally consented to this TeleHealth visit today. The patient is located at home, the provider is located at the [employer] [facility] office. The participants in this visit include the listed provider and patient and any and all parties involved. The visit was conducted today via WebEx.  HPI:   Chief Complaint: OBESITY Erin Warren is here to discuss her progress with her obesity treatment plan. She is on the keep a food journal with 1350 calories and 80 to 85 grams of protein daily plan and is following her eating plan approximately 50 % of the time. She states she is doing Tai Chi for 20 minutes 5 times per week. Susanthinks that she has maintained her weight. She is doing well overall. Adrielle had some minimal stress eating. We were unable to weigh the patient today for this TeleHealth visit. She feels as if she has maintained weight since her last visit. She has lost 5 lbs since starting treatment with [name].  Insulin Resistance [name] has a diagnosis of insulin resistance based on her elevated fasting insulin level >5. Although Erin Warren's blood glucose readings are still under good control, insulin resistance puts her at greater risk of metabolic syndrome and diabetes. Her last A1c was at 5.1 and last insulin level was at 20.4 She is taking metformin currently and continues to work on diet and exercise to decrease risk of diabetes. Nico denies polyphagia.  Hypertension Erin Warren is a 43 y.o. female with hypertension. She is taking Inderal LA. [name] is not checking her blood pressure at home. [name] denies chest pain or shortness of breath on exertion. She is working weight loss to help control her blood pressure with the goal of decreasing her risk of heart attack and stroke.   ASSESSMENT AND PLAN:  Insulin resistance  Essential hypertension  Class 2 severe obesity with serious comorbidity and body mass  index (BMI) of 39.0 to 39.9 in adult, unspecified obesity type ([facility])  PLAN:  Insulin Resistance Symphany will continue to work on weight loss, exercise, and decreasing simple carbohydrates in her diet to help decrease the risk of diabetes. We dicussed metformin including benefits and risks. She was informed that eating too many simple carbohydrates or too many calories at one sitting increases the likelihood of GI side effects. Alayziah will continue metformin for now and prescription was not written today. Evett agreed to follow up with [name] as directed to monitor her progress.  Hypertension We discussed sodium restriction, working on healthy weight loss, and a regular exercise program as the means to achieve improved blood pressure control. Alexzia agreed with this plan and agreed to follow up as directed. We will continue to monitor her blood pressure as well as her progress with the above lifestyle modifications. She will continue her medications as prescribed and will watch for signs of hypotension as she continues her lifestyle modifications.  Obesity Erin Warren is currently in the action stage of change. As such, her goal is to continue with weight loss efforts She has agreed to keep a food journal with 1350 calories and 80 to 85 grams of protein daily Erin Warren will continue exercise for weight loss and overall health benefits. We discussed the following Behavioral Modification Strategies today: increase H2O intake, no skipping meals, keeping healthy foods in the home, increasing lean protein intake, decreasing simple carbohydrates, increasing vegetables, decrease eating out and work on meal planning and easy cooking  plans Latrise will weigh herself at home before each visit. Strategies for relaxation and ideas for lunch were discussed today.  Erin Warren has agreed to follow up with our clinic in 2 weeks. She was informed of the importance of frequent follow up visits to maximize her success with intensive lifestyle  modifications for her multiple health conditions.  ALLERGIES: Allergies  Allergen Reactions  . Food Hives, Nausea Only and Swelling    WHEAT  . Garlic   . Onion   . Topamax [Topiramate] Other (See Comments)    Flushing, paresthesias (tingling, burning).      MEDICATIONS: Current Outpatient Medications on File Prior to Visit  Medication Sig Dispense Refill  . albuterol (PROVENTIL HFA;VENTOLIN HFA) 108 (90 BASE) MCG/ACT inhaler Inhale 2 puffs into the lungs every 6 (six) hours as needed. 1 Inhaler 3  . Cholecalciferol (VITAMIN D) 2000 UNITS tablet Take 2,000 Units by mouth daily.    [name] EPIPEN 2-PAK 0.3 MG/0.3ML SOAJ injection as needed.    . fluticasone (FLONASE) 50 MCG/ACT nasal spray SPRAY 2 SPRAYS INTO EACH NOSTRIL EVERY DAY 16 g 2  . levothyroxine (SYNTHROID, LEVOTHROID) 25 MCG tablet Take 1 tablet (25 mcg total) by mouth daily. 90 tablet 3  . loratadine (CLARITIN) 10 MG tablet Take 10 mg by mouth daily.    . metFORMIN (GLUCOPHAGE) 500 MG tablet TAKE 1 TABLET BY MOUTH TWICE A DAY 60 tablet 0  . propranolol ER (INDERAL LA) 80 MG 24 hr capsule Take 1 capsule (80 mg total) by mouth daily. 90 capsule 4  . QVAR REDIHALER 80 MCG/ACT inhaler TAKE 1 PUFF BY MOUTH TWICE A DAY  5  . ranitidine (ZANTAC) 150 MG tablet Take 1 tablet (150 mg total) by mouth 2 (two) times daily. No more refills without office visit 180 tablet 0  . rizatriptan (MAXALT-MLT) 10 MG disintegrating tablet Take 1 tablet (10 mg total) by mouth as needed for migraine. May repeat in 2 hours if needed 9 tablet 12   No current facility-administered medications on file prior to visit.     PAST MEDICAL HISTORY: Past Medical History:  Diagnosis Date  . AC (acromioclavicular) joint bone spurs   . Allergy   . Asthma   . Bunion   . Edema, lower extremity   . GERD (gastroesophageal reflux disease)   . Heel spur    Both heels, one has fractured off  . Hypertension   . Hypothyroidism   . Migraines   . Multiple food  allergies   . Polycystic ovarian syndrome   . Seasonal allergies   . Thyroid disease   . Ulnar nerve abnormality     PAST SURGICAL HISTORY: Past Surgical History:  Procedure Laterality Date  . OOPHORECTOMY Left   . PLANTAR FASCIECTOMY Bilateral 08/2014  . TERATOMA EXCISION Left   . TUBAL LIGATION  2008    SOCIAL HISTORY: Social History   Tobacco Use  . Smoking status: Never Smoker  . Smokeless tobacco: Never Used  Substance Use Topics  . Alcohol use: Yes    Alcohol/week: 1.0 standard drinks    Types: 1 Glasses of wine per week    Comment: 1-4/month  . Drug use: No    FAMILY HISTORY: Family History  Problem Relation Age of Onset  . Thyroid disease Mother   . Allergies Mother   . Hypertension Father   . Heart disease Father   . Cancer Father        skin  . Sleep apnea Father   .  Obesity Father   . Alcohol abuse Maternal Grandfather   . Hypertension Paternal Grandmother   . Hypertension Paternal Grandfather   . Diabetes Paternal Grandfather   . Hypertension Brother   . Heart Problems Brother     ROS: Review of Systems  Constitutional: Negative for weight loss.  Respiratory: Negative for shortness of breath (on exertion).   Cardiovascular: Negative for chest pain.  Endo/Heme/Allergies:       Negative for polyphagia    PHYSICAL EXAM: Pt in no acute distress  RECENT LABS AND TESTS: BMET    Component Value Date/Time   NA 138 11/14/2018 0831   K 4.7 11/14/2018 0831   CL 103 11/14/2018 0831   CO2 19 (L) 11/14/2018 0831   GLUCOSE 86 11/14/2018 0831   GLUCOSE 80 02/10/2016 1735   BUN 15 11/14/2018 0831   CREATININE 0.85 11/14/2018 0831   CREATININE 0.67 02/10/2016 1735   CALCIUM 9.3 11/14/2018 0831   GFRNONAA 84 11/14/2018 0831   GFRAA 96 11/14/2018 0831   Lab Results  Component Value Date   HGBA1C 5.1 11/14/2018   HGBA1C 5.2 06/28/2018   HGBA1C 4.7 09/27/2013   Lab Results  Component Value Date   INSULIN 20.4 11/14/2018   INSULIN 14.2  06/28/2018   CBC    Component Value Date/Time   WBC 11.6 (H) 06/28/2018 1037   WBC 11.6 (H) 02/03/2018 1620   RBC 4.85 06/28/2018 1037   RBC 4.72 02/03/2018 1620   HGB 14.8 06/28/2018 1037   HCT 43.8 06/28/2018 1037   PLT 375.0 02/03/2018 1620   PLT 435 (H) 10/18/2017 1640   MCV 90 06/28/2018 1037   MCH 30.5 06/28/2018 1037   MCH 31.3 02/04/2015 1109   MCHC 33.8 06/28/2018 1037   MCHC 34.1 02/03/2018 1620   RDW 13.2 06/28/2018 1037   LYMPHSABS 1.9 06/28/2018 1037   MONOABS 0.7 02/03/2018 1620   EOSABS 0.3 06/28/2018 1037   BASOSABS 0.1 06/28/2018 1037   Iron/TIBC/Ferritin/ %Sat No results found for: IRON, TIBC, FERRITIN, IRONPCTSAT Lipid Panel     Component Value Date/Time   CHOL 140 11/14/2018 0831   TRIG 201 (H) 11/14/2018 0831   HDL 45 11/14/2018 0831   CHOLHDL 3.1 10/18/2017 1640   CHOLHDL 2.7 02/04/2015 1109   VLDL 23 02/04/2015 1109   LDLCALC 55 11/14/2018 0831   Hepatic Function Panel     Component Value Date/Time   PROT 7.1 11/14/2018 0831   ALBUMIN 3.9 11/14/2018 0831   AST 16 11/14/2018 0831   ALT 22 11/14/2018 0831   ALKPHOS 96 11/14/2018 0831   BILITOT <0.2 11/14/2018 0831      Component Value Date/Time   TSH 1.710 06/28/2018 1037   TSH 2.930 10/18/2017 1640   TSH 3.010 08/17/2016 1650     Ref. Range 06/28/2018 10:37  Vitamin D, 25-Hydroxy Latest Ref Range: 30.0 - 100.0 ng/mL 47.5    I, Doreene Nest, am acting as Location manager for [employer]. Owens Shark, DO  I have reviewed the above documentation for accuracy and completeness, and I agree with the above. -Jearld Lesch, DO

## 2019-01-10 ENCOUNTER — Other Ambulatory Visit: Payer: Self-pay

## 2019-01-10 ENCOUNTER — Ambulatory Visit (INDEPENDENT_AMBULATORY_CARE_PROVIDER_SITE_OTHER): Payer: 59 | Admitting: Bariatrics

## 2019-01-10 ENCOUNTER — Encounter (INDEPENDENT_AMBULATORY_CARE_PROVIDER_SITE_OTHER): Payer: Self-pay | Admitting: Bariatrics

## 2019-01-10 DIAGNOSIS — K219 Gastro-esophageal reflux disease without esophagitis: Secondary | ICD-10-CM

## 2019-01-10 DIAGNOSIS — Z6839 Body mass index (BMI) 39.0-39.9, adult: Secondary | ICD-10-CM

## 2019-01-10 DIAGNOSIS — E8881 Metabolic syndrome: Secondary | ICD-10-CM

## 2019-01-11 NOTE — Progress Notes (Signed)
Office: [phone]  /  Fax: [phone] TeleHealth Visit:  Erin Warren has verbally consented to this TeleHealth visit today. The patient is located at home, the provider is located at the [facility] office. The participants in this visit include the listed provider and patient and any and all parties involved. The visit was conducted today via WebEx.  HPI:   Chief Complaint: OBESITY Erin Warren is here to discuss her progress with her obesity treatment plan. She is on the Category 2 plan and is following her eating plan approximately 75 % of the time. She states she is Tai Chi and aerobics for 20 minutes 3 to 4 times per week. Ishani states that she has lost 5 pounds (weight 227 lbs). We were unable to weigh the patient today for this TeleHealth visit. She feels as if she has lost weight since her last visit. She has lost 13 lbs since starting treatment with [name].  Insulin Resistance Erin Warren has a diagnosis of insulin resistance based on her elevated fasting insulin level >5. Although Erin Warren's blood glucose readings are still under good control, insulin resistance puts her at greater risk of metabolic syndrome and diabetes. She is taking metformin currently and continues to work on diet and exercise to decrease risk of diabetes. Erin Warren denies polyphagia.  GERD (gastroesophageal reflux disease) Erin Warren has a diagnosis of gastroesophageal reflux disease and she is taking PPI if needed.  ASSESSMENT AND PLAN:  Insulin resistance  Gastroesophageal reflux disease, esophagitis presence not specified  Class 2 severe obesity with serious comorbidity and body mass index (BMI) of 39.0 to 39.9 in adult, unspecified obesity type (Erin Warren)  PLAN:  Insulin Resistance Erin Warren will continue to work on weight loss, exercise, and decreasing simple carbohydrates in her diet to help decrease the risk of diabetes. We dicussed metformin including benefits and risks. She was informed that eating too many  simple carbohydrates or too many calories at one sitting increases the likelihood of GI side effects. Savahna will continue metformin for now and prescription was not written today. Jaeleigh agreed to follow up with [name] as directed to monitor her progress.  GERD (gastroesophageal reflux disease) Erin Warren will watch her triggers and will follow up with our clinic in 2 weeks.  Obesity Erin Warren is currently in the action stage of change. As such, her goal is to continue with weight loss efforts She has agreed to follow the Category 2 plan Erin Warren will continue exercise for weight loss and overall health benefits. We discussed the following Behavioral Modification Strategies today: increase H2O intake, no skipping meals, keeping healthy foods in the home, increasing lean protein intake, decreasing simple carbohydrates, increasing vegetables, decrease eating out and work on meal planning and intentional eating Erin Warren will continue to weigh herself at home before each visit.  Erin Warren has agreed to follow up with our clinic in 2 weeks. She was informed of the importance of frequent follow up visits to maximize her success with intensive lifestyle modifications for her multiple health conditions.  ALLERGIES: Allergies  Allergen Reactions  . Food Hives, Nausea Only and Swelling    WHEAT  . Garlic   . Onion   . Topamax [Topiramate] Other (See Comments)    Flushing, paresthesias (tingling, burning).      MEDICATIONS: Current Outpatient Medications on File Prior to Visit  Medication Sig Dispense Refill  . albuterol (PROVENTIL HFA;VENTOLIN HFA) 108 (90 BASE) MCG/ACT inhaler Inhale 2 puffs into the lungs every 6 (six) hours as needed. 1 Inhaler  3  . Cholecalciferol (VITAMIN D) 2000 UNITS tablet Take 2,000 Units by mouth daily.    [name] EPIPEN 2-PAK 0.3 MG/0.3ML SOAJ injection as needed.    . fluticasone (FLONASE) 50 MCG/ACT nasal spray SPRAY 2 SPRAYS INTO EACH NOSTRIL EVERY DAY 16 g 2  . levothyroxine (SYNTHROID,  LEVOTHROID) 25 MCG tablet Take 1 tablet (25 mcg total) by mouth daily. 90 tablet 3  . loratadine (CLARITIN) 10 MG tablet Take 10 mg by mouth daily.    . metFORMIN (GLUCOPHAGE) 500 MG tablet TAKE 1 TABLET BY MOUTH TWICE A DAY 60 tablet 0  . propranolol ER (INDERAL LA) 80 MG 24 hr capsule Take 1 capsule (80 mg total) by mouth daily. 90 capsule 4  . QVAR REDIHALER 80 MCG/ACT inhaler TAKE 1 PUFF BY MOUTH TWICE A DAY  5  . ranitidine (ZANTAC) 150 MG tablet Take 1 tablet (150 mg total) by mouth 2 (two) times daily. No more refills without office visit 180 tablet 0  . rizatriptan (MAXALT-MLT) 10 MG disintegrating tablet Take 1 tablet (10 mg total) by mouth as needed for migraine. May repeat in 2 hours if needed 9 tablet 12   No current facility-administered medications on file prior to visit.     PAST MEDICAL HISTORY: Past Medical History:  Diagnosis Date  . AC (acromioclavicular) joint bone spurs   . Allergy   . Asthma   . Bunion   . Edema, lower extremity   . GERD (gastroesophageal reflux disease)   . Heel spur    Both heels, one has fractured off  . Hypertension   . Hypothyroidism   . Migraines   . Multiple food allergies   . Polycystic ovarian syndrome   . Seasonal allergies   . Thyroid disease   . Ulnar nerve abnormality     PAST SURGICAL HISTORY: Past Surgical History:  Procedure Laterality Date  . OOPHORECTOMY Left   . PLANTAR FASCIECTOMY Bilateral 08/2014  . TERATOMA EXCISION Left   . TUBAL LIGATION  2008    SOCIAL HISTORY: Social History   Tobacco Use  . Smoking status: Never Smoker  . Smokeless tobacco: Never Used  Substance Use Topics  . Alcohol use: Yes    Alcohol/week: 1.0 standard drinks    Types: 1 Glasses of wine per week    Comment: 1-4/month  . Drug use: No    FAMILY HISTORY: Family History  Problem Relation Age of Onset  . Thyroid disease Mother   . Allergies Mother   . Hypertension Father   . Heart disease Father   . Cancer Father         skin  . Sleep apnea Father   . Obesity Father   . Alcohol abuse Maternal Grandfather   . Hypertension Paternal Grandmother   . Hypertension Paternal Grandfather   . Diabetes Paternal Grandfather   . Hypertension Brother   . Heart Problems Brother     ROS: Review of Systems  Constitutional: Positive for weight loss.  Endo/Heme/Allergies:       Negative for polyphagia    PHYSICAL EXAM: Pt in no acute distress  RECENT LABS AND TESTS: BMET    Component Value Date/Time   NA 138 11/14/2018 0831   K 4.7 11/14/2018 0831   CL 103 11/14/2018 0831   CO2 19 (L) 11/14/2018 0831   GLUCOSE 86 11/14/2018 0831   GLUCOSE 80 02/10/2016 1735   BUN 15 11/14/2018 0831   CREATININE 0.85 11/14/2018 0831   CREATININE 0.67 02/10/2016  1735   CALCIUM 9.3 11/14/2018 0831   GFRNONAA 84 11/14/2018 0831   GFRAA 96 11/14/2018 0831   Lab Results  Component Value Date   HGBA1C 5.1 11/14/2018   HGBA1C 5.2 06/28/2018   HGBA1C 4.7 09/27/2013   Lab Results  Component Value Date   INSULIN 20.4 11/14/2018   INSULIN 14.2 06/28/2018   CBC    Component Value Date/Time   WBC 11.6 (H) 06/28/2018 1037   WBC 11.6 (H) 02/03/2018 1620   RBC 4.85 06/28/2018 1037   RBC 4.72 02/03/2018 1620   HGB 14.8 06/28/2018 1037   HCT 43.8 06/28/2018 1037   PLT 375.0 02/03/2018 1620   PLT 435 (H) 10/18/2017 1640   MCV 90 06/28/2018 1037   MCH 30.5 06/28/2018 1037   MCH 31.3 02/04/2015 1109   MCHC 33.8 06/28/2018 1037   MCHC 34.1 02/03/2018 1620   RDW 13.2 06/28/2018 1037   LYMPHSABS 1.9 06/28/2018 1037   MONOABS 0.7 02/03/2018 1620   EOSABS 0.3 06/28/2018 1037   BASOSABS 0.1 06/28/2018 1037   Iron/TIBC/Ferritin/ %Sat No results found for: IRON, TIBC, FERRITIN, IRONPCTSAT Lipid Panel     Component Value Date/Time   CHOL 140 11/14/2018 0831   TRIG 201 (H) 11/14/2018 0831   HDL 45 11/14/2018 0831   CHOLHDL 3.1 10/18/2017 1640   CHOLHDL 2.7 02/04/2015 1109   VLDL 23 02/04/2015 1109   LDLCALC 55  11/14/2018 0831   Hepatic Function Panel     Component Value Date/Time   PROT 7.1 11/14/2018 0831   ALBUMIN 3.9 11/14/2018 0831   AST 16 11/14/2018 0831   ALT 22 11/14/2018 0831   ALKPHOS 96 11/14/2018 0831   BILITOT <0.2 11/14/2018 0831      Component Value Date/Time   TSH 1.710 06/28/2018 1037   TSH 2.930 10/18/2017 1640   TSH 3.010 08/17/2016 1650     Ref. Range 06/28/2018 10:37  Vitamin D, 25-Hydroxy Latest Ref Range: 30.0 - 100.0 ng/mL 47.5    I, Doreene Nest, am acting as Location manager for [employer]. Owens Shark, DO  I have reviewed the above documentation for accuracy and completeness, and I agree with the above. -Jearld Lesch, DO

## 2019-01-24 ENCOUNTER — Encounter (INDEPENDENT_AMBULATORY_CARE_PROVIDER_SITE_OTHER): Payer: Self-pay | Admitting: Bariatrics

## 2019-01-24 ENCOUNTER — Ambulatory Visit (INDEPENDENT_AMBULATORY_CARE_PROVIDER_SITE_OTHER): Payer: 59 | Admitting: Bariatrics

## 2019-01-24 ENCOUNTER — Other Ambulatory Visit: Payer: Self-pay

## 2019-01-24 DIAGNOSIS — Z6839 Body mass index (BMI) 39.0-39.9, adult: Secondary | ICD-10-CM | POA: Diagnosis not present

## 2019-01-24 DIAGNOSIS — E8881 Metabolic syndrome: Secondary | ICD-10-CM | POA: Diagnosis not present

## 2019-01-24 NOTE — Progress Notes (Signed)
Office: [phone]  /  Fax: [phone] TeleHealth Visit:  Erin Warren has verbally consented to this TeleHealth visit today. The patient is located at home, the provider is located at the [facility] office. The participants in this visit include the listed provider and patient and any and all parties involved. The visit was conducted today via WebEx.  HPI:   Chief Complaint: OBESITY Erin Warren is here to discuss her progress with her obesity treatment plan. She is on the Category 2 plan and is following her eating plan approximately 65 % of the time. She states she is doing Tai Chi and aerobics 20 minutes 3 to 4 times per week. Erin Warren states that she has gained 1 to 2 pounds (weight 228 lbs). She is doing minimal stress eating. Erin Warren is doing well with water. We were unable to weigh the patient today for this TeleHealth visit. She feels as if she has gained weight since her last visit. She has lost 12 lbs since starting treatment with [name].  Insulin Resistance Nami has a diagnosis of insulin resistance based on her elevated fasting insulin level >5. Although Lariah's blood glucose readings are still under good control, insulin resistance puts her at greater risk of metabolic syndrome and diabetes. Her appetite is normal.Erin Warren continues to work on diet and exercise to decrease risk of diabetes. Erin Warren denies polyphagia.  ASSESSMENT AND PLAN:  Insulin resistance  Class 2 severe obesity with serious comorbidity and body mass index (BMI) of 39.0 to 39.9 in adult, unspecified obesity type (Erin Warren)  PLAN:  Insulin Resistance Erin Warren will continue to work on weight loss, exercise, increasing lean protein and decreasing simple carbohydrates in her diet to help decrease the risk of diabetes. She was informed that eating too many simple carbohydrates or too many calories at one sitting increases the likelihood of GI side effects. Pamela agreed to follow up with [name] as directed to monitor her  progress.  Obesity Erin Warren is currently in the action stage of change. As such, her goal is to continue with weight loss efforts She has agreed to follow the Category 2 plan Erin Warren will continue her exercise regimen for weight loss and overall health benefits. We discussed the following Behavioral Modification Strategies today: increase H2O intake, no skipping meals, keeping healthy foods in the home, better snacking choices, increasing lean protein intake, decreasing simple carbohydrates, increasing vegetables, decrease eating out, work on meal planning and easy cooking plans, ways to avoid boredom eating, ways to avoid night time snacking and decrease liquid calories Erin Warren will weigh herself at home and record before each visit. She will keep "bad snacks" out of the house.  Erin Warren has agreed to follow up with our clinic in 2 weeks. She was informed of the importance of frequent follow up visits to maximize her success with intensive lifestyle modifications for her multiple health conditions.  ALLERGIES: Allergies  Allergen Reactions  . Food Hives, Nausea Only and Swelling    WHEAT  . Garlic   . Onion   . Topamax [Topiramate] Other (See Comments)    Flushing, paresthesias (tingling, burning).      MEDICATIONS: Current Outpatient Medications on File Prior to Visit  Medication Sig Dispense Refill  . albuterol (PROVENTIL HFA;VENTOLIN HFA) 108 (90 BASE) MCG/ACT inhaler Inhale 2 puffs into the lungs every 6 (six) hours as needed. 1 Inhaler 3  . Cholecalciferol (VITAMIN D) 2000 UNITS tablet Take 2,000 Units by mouth daily.    [name] EPIPEN 2-PAK 0.3 MG/0.3ML  SOAJ injection as needed.    . fluticasone (FLONASE) 50 MCG/ACT nasal spray SPRAY 2 SPRAYS INTO EACH NOSTRIL EVERY DAY 16 g 2  . levothyroxine (SYNTHROID, LEVOTHROID) 25 MCG tablet Take 1 tablet (25 mcg total) by mouth daily. 90 tablet 3  . loratadine (CLARITIN) 10 MG tablet Take 10 mg by mouth daily.    . metFORMIN (GLUCOPHAGE) 500 MG tablet  TAKE 1 TABLET BY MOUTH TWICE A DAY 60 tablet 0  . propranolol ER (INDERAL LA) 80 MG 24 hr capsule Take 1 capsule (80 mg total) by mouth daily. 90 capsule 4  . QVAR REDIHALER 80 MCG/ACT inhaler TAKE 1 PUFF BY MOUTH TWICE A DAY  5  . ranitidine (ZANTAC) 150 MG tablet Take 1 tablet (150 mg total) by mouth 2 (two) times daily. No more refills without office visit 180 tablet 0  . rizatriptan (MAXALT-MLT) 10 MG disintegrating tablet Take 1 tablet (10 mg total) by mouth as needed for migraine. May repeat in 2 hours if needed 9 tablet 12   No current facility-administered medications on file prior to visit.     PAST MEDICAL HISTORY: Past Medical History:  Diagnosis Date  . AC (acromioclavicular) joint bone spurs   . Allergy   . Asthma   . Bunion   . Edema, lower extremity   . GERD (gastroesophageal reflux disease)   . Heel spur    Both heels, one has fractured off  . Hypertension   . Hypothyroidism   . Migraines   . Multiple food allergies   . Polycystic ovarian syndrome   . Seasonal allergies   . Thyroid disease   . Ulnar nerve abnormality     PAST SURGICAL HISTORY: Past Surgical History:  Procedure Laterality Date  . OOPHORECTOMY Left   . PLANTAR FASCIECTOMY Bilateral 08/2014  . TERATOMA EXCISION Left   . TUBAL LIGATION  2008    SOCIAL HISTORY: Social History   Tobacco Use  . Smoking status: Never Smoker  . Smokeless tobacco: Never Used  Substance Use Topics  . Alcohol use: Yes    Alcohol/week: 1.0 standard drinks    Types: 1 Glasses of wine per week    Comment: 1-4/month  . Drug use: No    FAMILY HISTORY: Family History  Problem Relation Age of Onset  . Thyroid disease Mother   . Allergies Mother   . Hypertension Father   . Heart disease Father   . Cancer Father        skin  . Sleep apnea Father   . Obesity Father   . Alcohol abuse Maternal Grandfather   . Hypertension Paternal Grandmother   . Hypertension Paternal Grandfather   . Diabetes Paternal  Grandfather   . Hypertension Brother   . Heart Problems Brother     ROS: Review of Systems  Constitutional: Negative for weight loss.  Endo/Heme/Allergies:       Negative for polyphagia    PHYSICAL EXAM: Pt in no acute distress  RECENT LABS AND TESTS: BMET    Component Value Date/Time   NA 138 11/14/2018 0831   K 4.7 11/14/2018 0831   CL 103 11/14/2018 0831   CO2 19 (L) 11/14/2018 0831   GLUCOSE 86 11/14/2018 0831   GLUCOSE 80 02/10/2016 1735   BUN 15 11/14/2018 0831   CREATININE 0.85 11/14/2018 0831   CREATININE 0.67 02/10/2016 1735   CALCIUM 9.3 11/14/2018 0831   GFRNONAA 84 11/14/2018 0831   GFRAA 96 11/14/2018 0831   Lab Results  Component Value Date   HGBA1C 5.1 11/14/2018   HGBA1C 5.2 06/28/2018   HGBA1C 4.7 09/27/2013   Lab Results  Component Value Date   INSULIN 20.4 11/14/2018   INSULIN 14.2 06/28/2018   CBC    Component Value Date/Time   WBC 11.6 (H) 06/28/2018 1037   WBC 11.6 (H) 02/03/2018 1620   RBC 4.85 06/28/2018 1037   RBC 4.72 02/03/2018 1620   HGB 14.8 06/28/2018 1037   HCT 43.8 06/28/2018 1037   PLT 375.0 02/03/2018 1620   PLT 435 (H) 10/18/2017 1640   MCV 90 06/28/2018 1037   MCH 30.5 06/28/2018 1037   MCH 31.3 02/04/2015 1109   MCHC 33.8 06/28/2018 1037   MCHC 34.1 02/03/2018 1620   RDW 13.2 06/28/2018 1037   LYMPHSABS 1.9 06/28/2018 1037   MONOABS 0.7 02/03/2018 1620   EOSABS 0.3 06/28/2018 1037   BASOSABS 0.1 06/28/2018 1037   Iron/TIBC/Ferritin/ %Sat No results found for: IRON, TIBC, FERRITIN, IRONPCTSAT Lipid Panel     Component Value Date/Time   CHOL 140 11/14/2018 0831   TRIG 201 (H) 11/14/2018 0831   HDL 45 11/14/2018 0831   CHOLHDL 3.1 10/18/2017 1640   CHOLHDL 2.7 02/04/2015 1109   VLDL 23 02/04/2015 1109   LDLCALC 55 11/14/2018 0831   Hepatic Function Panel     Component Value Date/Time   PROT 7.1 11/14/2018 0831   ALBUMIN 3.9 11/14/2018 0831   AST 16 11/14/2018 0831   ALT 22 11/14/2018 0831   ALKPHOS  96 11/14/2018 0831   BILITOT <0.2 11/14/2018 0831      Component Value Date/Time   TSH 1.710 06/28/2018 1037   TSH 2.930 10/18/2017 1640   TSH 3.010 08/17/2016 1650     Ref. Range 06/28/2018 10:37  Vitamin D, 25-Hydroxy Latest Ref Range: 30.0 - 100.0 ng/mL 47.5    I, Doreene Nest, am acting as Location manager for [employer]. Owens Shark, DO  I have reviewed the above documentation for accuracy and completeness, and I agree with the above. -Jearld Lesch, DO

## 2019-01-25 ENCOUNTER — Encounter (INDEPENDENT_AMBULATORY_CARE_PROVIDER_SITE_OTHER): Payer: Self-pay | Admitting: Bariatrics

## 2019-02-07 ENCOUNTER — Other Ambulatory Visit: Payer: Self-pay

## 2019-02-07 ENCOUNTER — Encounter (INDEPENDENT_AMBULATORY_CARE_PROVIDER_SITE_OTHER): Payer: Self-pay | Admitting: Bariatrics

## 2019-02-07 ENCOUNTER — Ambulatory Visit (INDEPENDENT_AMBULATORY_CARE_PROVIDER_SITE_OTHER): Payer: 59 | Admitting: Bariatrics

## 2019-02-07 DIAGNOSIS — Z6839 Body mass index (BMI) 39.0-39.9, adult: Secondary | ICD-10-CM

## 2019-02-07 DIAGNOSIS — K219 Gastro-esophageal reflux disease without esophagitis: Secondary | ICD-10-CM | POA: Diagnosis not present

## 2019-02-07 DIAGNOSIS — E282 Polycystic ovarian syndrome: Secondary | ICD-10-CM

## 2019-02-07 NOTE — Progress Notes (Signed)
Office: [phone]  /  Fax: [phone] TeleHealth Visit:  Erin Warren has verbally consented to this TeleHealth visit today. The patient is located at home, the provider is located at the [facility] office. The participants in this visit include the listed provider and patient and any and all parties involved. The visit was conducted today via WebEx.  HPI:   Chief Complaint: OBESITY Erin Warren is here to discuss her progress with her obesity treatment plan. She is on the Category 2 plan and is following her eating plan approximately 50 % of the time. She states she is doing Tai Chi 20 minutes 4 times per week. Erin Warren states that she has maintained her weight. She denies any specific struggling behaviors. She is doing well with water and protein. We were unable to weigh the patient today for this TeleHealth visit. She feels as if she has maintained weight since her last visit. She has lost 12 lbs since starting treatment with [name].  PCOS (polycystic ovarian syndrome) Erin Warren has a diagnosis of polycystic ovarian syndrome and she is taking metformin currently. She denies polyphagia.  GERD (gastroesophageal reflux disease) Erin Warren has a diagnosis of gastroesophageal reflux disease. She is currently taking Zantac.    ASSESSMENT AND PLAN:  No diagnosis found.  PLAN:  PCOS (polycystic ovarian syndrome) Erin Warren will continue metformin. She will continue to work on decreasing simple carbohydrates and increasing exercise. Erin Warren will follow up with our clinic at the agreed upon time.  GERD (gastroesophageal reflux disease) Erin Warren will continue her GERD medications. She will avoid triggers. Erin Warren will follow up with our clinic as directed.  Obesity Erin Warren is currently in the action stage of change. As such, her goal is to continue with weight loss efforts She has agreed to follow the Category 2 plan Erin Warren will continue exercise (Tai Chi and aerobics) for weight loss and overall  health benefits. We discussed the following Behavioral Modification Strategies today: increase H2O intake, no skipping meals, keeping healthy foods in the home, increasing lean protein intake, decreasing simple carbohydrates, increasing vegetables, decrease eating out and work on meal planning and easy cooking plans Erin Warren will weigh herself at home and record.  Erin Warren has agreed to follow up with our clinic in 2 weeks. She was informed of the importance of frequent follow up visits to maximize her success with intensive lifestyle modifications for her multiple health conditions.  ALLERGIES: Allergies  Allergen Reactions  . Food Hives, Nausea Only and Swelling    WHEAT  . Garlic   . Onion   . Topamax [Topiramate] Other (See Comments)    Flushing, paresthesias (tingling, burning).      MEDICATIONS: Current Outpatient Medications on File Prior to Visit  Medication Sig Dispense Refill  . albuterol (PROVENTIL HFA;VENTOLIN HFA) 108 (90 BASE) MCG/ACT inhaler Inhale 2 puffs into the lungs every 6 (six) hours as needed. 1 Inhaler 3  . Cholecalciferol (VITAMIN D) 2000 UNITS tablet Take 2,000 Units by mouth daily.    [name] EPIPEN 2-PAK 0.3 MG/0.3ML SOAJ injection as needed.    . fluticasone (FLONASE) 50 MCG/ACT nasal spray SPRAY 2 SPRAYS INTO EACH NOSTRIL EVERY DAY 16 g 2  . levothyroxine (SYNTHROID, LEVOTHROID) 25 MCG tablet Take 1 tablet (25 mcg total) by mouth daily. 90 tablet 3  . loratadine (CLARITIN) 10 MG tablet Take 10 mg by mouth daily.    . metFORMIN (GLUCOPHAGE) 500 MG tablet TAKE 1 TABLET BY MOUTH TWICE A DAY 60 tablet 0  . propranolol  ER (INDERAL LA) 80 MG 24 hr capsule Take 1 capsule (80 mg total) by mouth daily. 90 capsule 4  . QVAR REDIHALER 80 MCG/ACT inhaler TAKE 1 PUFF BY MOUTH TWICE A DAY  5  . ranitidine (ZANTAC) 150 MG tablet Take 1 tablet (150 mg total) by mouth 2 (two) times daily. No more refills without office visit 180 tablet 0  . rizatriptan (MAXALT-MLT) 10 MG disintegrating  tablet Take 1 tablet (10 mg total) by mouth as needed for migraine. May repeat in 2 hours if needed 9 tablet 12   No current facility-administered medications on file prior to visit.     PAST MEDICAL HISTORY: Past Medical History:  Diagnosis Date  . AC (acromioclavicular) joint bone spurs   . Allergy   . Asthma   . Bunion   . Edema, lower extremity   . GERD (gastroesophageal reflux disease)   . Heel spur    Both heels, one has fractured off  . Hypertension   . Hypothyroidism   . Migraines   . Multiple food allergies   . Polycystic ovarian syndrome   . Seasonal allergies   . Thyroid disease   . Ulnar nerve abnormality     PAST SURGICAL HISTORY: Past Surgical History:  Procedure Laterality Date  . OOPHORECTOMY Left   . PLANTAR FASCIECTOMY Bilateral 08/2014  . TERATOMA EXCISION Left   . TUBAL LIGATION  2008    SOCIAL HISTORY: Social History   Tobacco Use  . Smoking status: Never Smoker  . Smokeless tobacco: Never Used  Substance Use Topics  . Alcohol use: Yes    Alcohol/week: 1.0 standard drinks    Types: 1 Glasses of wine per week    Comment: 1-4/month  . Drug use: No    FAMILY HISTORY: Family History  Problem Relation Age of Onset  . Thyroid disease Mother   . Allergies Mother   . Hypertension Father   . Heart disease Father   . Cancer Father        skin  . Sleep apnea Father   . Obesity Father   . Alcohol abuse Maternal Grandfather   . Hypertension Paternal Grandmother   . Hypertension Paternal Grandfather   . Diabetes Paternal Grandfather   . Hypertension Brother   . Heart Problems Brother     ROS: Review of Systems  Constitutional: Negative for weight loss.  Endo/Heme/Allergies:       Negative for polyphagia    PHYSICAL EXAM: Pt in no acute distress  RECENT LABS AND TESTS: BMET    Component Value Date/Time   NA 138 11/14/2018 0831   K 4.7 11/14/2018 0831   CL 103 11/14/2018 0831   CO2 19 (L) 11/14/2018 0831   GLUCOSE 86  11/14/2018 0831   GLUCOSE 80 02/10/2016 1735   BUN 15 11/14/2018 0831   CREATININE 0.85 11/14/2018 0831   CREATININE 0.67 02/10/2016 1735   CALCIUM 9.3 11/14/2018 0831   GFRNONAA 84 11/14/2018 0831   GFRAA 96 11/14/2018 0831   Lab Results  Component Value Date   HGBA1C 5.1 11/14/2018   HGBA1C 5.2 06/28/2018   HGBA1C 4.7 09/27/2013   Lab Results  Component Value Date   INSULIN 20.4 11/14/2018   INSULIN 14.2 06/28/2018   CBC    Component Value Date/Time   WBC 11.6 (H) 06/28/2018 1037   WBC 11.6 (H) 02/03/2018 1620   RBC 4.85 06/28/2018 1037   RBC 4.72 02/03/2018 1620   HGB 14.8 06/28/2018 1037   HCT  43.8 06/28/2018 1037   PLT 375.0 02/03/2018 1620   PLT 435 (H) 10/18/2017 1640   MCV 90 06/28/2018 1037   MCH 30.5 06/28/2018 1037   MCH 31.3 02/04/2015 1109   MCHC 33.8 06/28/2018 1037   MCHC 34.1 02/03/2018 1620   RDW 13.2 06/28/2018 1037   LYMPHSABS 1.9 06/28/2018 1037   MONOABS 0.7 02/03/2018 1620   EOSABS 0.3 06/28/2018 1037   BASOSABS 0.1 06/28/2018 1037   Iron/TIBC/Ferritin/ %Sat No results found for: IRON, TIBC, FERRITIN, IRONPCTSAT Lipid Panel     Component Value Date/Time   CHOL 140 11/14/2018 0831   TRIG 201 (H) 11/14/2018 0831   HDL 45 11/14/2018 0831   CHOLHDL 3.1 10/18/2017 1640   CHOLHDL 2.7 02/04/2015 1109   VLDL 23 02/04/2015 1109   LDLCALC 55 11/14/2018 0831   Hepatic Function Panel     Component Value Date/Time   PROT 7.1 11/14/2018 0831   ALBUMIN 3.9 11/14/2018 0831   AST 16 11/14/2018 0831   ALT 22 11/14/2018 0831   ALKPHOS 96 11/14/2018 0831   BILITOT <0.2 11/14/2018 0831      Component Value Date/Time   TSH 1.710 06/28/2018 1037   TSH 2.930 10/18/2017 1640   TSH 3.010 08/17/2016 1650    Results for SAARA, KIJOWSKI (MRN [id number]) as of 02/07/2019 13:01  Ref. Range 06/28/2018 10:37  Vitamin D, 25-Hydroxy Latest Ref Range: 30.0 - 100.0 ng/mL 47.5    I, Doreene Nest, am acting as Location manager for [employer]. Owens Shark, DO  I have  reviewed the above documentation for accuracy and completeness, and I agree with the above. -Jearld Lesch, DO

## 2019-02-20 ENCOUNTER — Ambulatory Visit (INDEPENDENT_AMBULATORY_CARE_PROVIDER_SITE_OTHER): Payer: 59 | Admitting: Bariatrics

## 2019-02-20 ENCOUNTER — Encounter (INDEPENDENT_AMBULATORY_CARE_PROVIDER_SITE_OTHER): Payer: Self-pay | Admitting: Bariatrics

## 2019-02-20 ENCOUNTER — Other Ambulatory Visit: Payer: Self-pay

## 2019-02-20 DIAGNOSIS — E8881 Metabolic syndrome: Secondary | ICD-10-CM | POA: Diagnosis not present

## 2019-02-20 DIAGNOSIS — I10 Essential (primary) hypertension: Secondary | ICD-10-CM | POA: Diagnosis not present

## 2019-02-20 DIAGNOSIS — Z6839 Body mass index (BMI) 39.0-39.9, adult: Secondary | ICD-10-CM | POA: Diagnosis not present

## 2019-02-21 NOTE — Progress Notes (Signed)
Office: [phone]  /  Fax: [phone] TeleHealth Visit:  Erin Warren has verbally consented to this TeleHealth visit today. The patient is located at home, the provider is located at the [facility] office. The participants in this visit include the listed provider and patient and any and all parties involved. The visit was conducted today via WebEx.  HPI:   Chief Complaint: OBESITY Erin Warren is here to discuss her progress with her obesity treatment plan. She is on the Category 2 plan and is following her eating plan approximately 50 % of the time. She states she is exercising 0 minutes 0 times per week. Erin Warren states that she weighs the same. She states this is a "stressful" time of week. Erin Warren has struggles with the plan. She is drinking water well. We were unable to weigh the patient today for this TeleHealth visit. She feels as if she has maintained weight since her last visit. She has lost 12 lbs since starting treatment with [name].  Insulin Resistance Erin Warren has a diagnosis of insulin resistance based on her elevated fasting insulin level >5. Although Erin Warren's blood glucose readings are still under good control, insulin resistance puts her at greater risk of metabolic syndrome and diabetes. Her last A1c was at 5.2 and last insulin level was at 14.2 Erin Warren has a history of PCOS.  She is taking metformin currently and continues to work on diet and exercise to decrease risk of diabetes. She denies polyphagia.  Hypertension Erin Warren is a 43 y.o. female with hypertension. She is not on medications. Erin Warren denies chest pain or shortness of breath on exertion. She is working weight loss to help control her blood pressure with the goal of decreasing her risk of heart attack and stroke. Erin Warren blood pressure is currently controlled.  ASSESSMENT AND PLAN:  Insulin resistance  Essential hypertension  Class 2 severe obesity with serious comorbidity and body mass index  (BMI) of 39.0 to 39.9 in adult, unspecified obesity type (HCC)  PLAN:  Insulin Resistance [name] will continue to work on weight loss, exercise, and decreasing simple carbohydrates in her diet to help decrease the risk of diabetes. We dicussed metformin including benefits and risks. She was informed that eating too many simple carbohydrates or too many calories at one sitting increases the likelihood of GI side effects. [name] will continue her medications and follow up with [name] as directed to monitor her progress.  Hypertension We discussed sodium restriction, working on healthy weight loss, and a regular exercise program as the means to achieve improved blood pressure control. Erin Warren agreed with this plan and agreed to follow up as directed. We will continue to monitor her blood pressure as well as her progress with the above lifestyle modifications. Erin Warren will increase her activity and she will watch for signs of hypotension as she continues her lifestyle modifications.  Obesity Erin Warren is currently in the action stage of change. As such, her goal is to continue with weight loss efforts She has agreed to follow the Category 2 plan Erin Warren will get back to exercise for weight loss and overall health benefits. We discussed the following Behavioral Modification Strategies today: increase H2O intake, no skipping meals, keeping healthy foods in the home, increasing lean protein intake, decreasing simple carbohydrates, increasing vegetables, decrease eating out and work on meal planning and easy cooking plans Erin Warren will weigh herself at home and record.  Erin Warren has agreed to follow up with our clinic in 2 weeks.  She was informed of the importance of frequent follow up visits to maximize her success with intensive lifestyle modifications for her multiple health conditions.  ALLERGIES: Allergies  Allergen Reactions  . Food Hives, Nausea Only and Swelling    WHEAT  . Garlic   . Onion   . Topamax  [Topiramate] Other (See Comments)    Flushing, paresthesias (tingling, burning).      MEDICATIONS: Current Outpatient Medications on File Prior to Visit  Medication Sig Dispense Refill  . albuterol (PROVENTIL HFA;VENTOLIN HFA) 108 (90 BASE) MCG/ACT inhaler Inhale 2 puffs into the lungs every 6 (six) hours as needed. 1 Inhaler 3  . Cholecalciferol (VITAMIN D) 2000 UNITS tablet Take 2,000 Units by mouth daily.    [name] EPIPEN 2-PAK 0.3 MG/0.3ML SOAJ injection as needed.    . fluticasone (FLONASE) 50 MCG/ACT nasal spray SPRAY 2 SPRAYS INTO EACH NOSTRIL EVERY DAY 16 g 2  . levothyroxine (SYNTHROID, LEVOTHROID) 25 MCG tablet Take 1 tablet (25 mcg total) by mouth daily. 90 tablet 3  . loratadine (CLARITIN) 10 MG tablet Take 10 mg by mouth daily.    . metFORMIN (GLUCOPHAGE) 500 MG tablet TAKE 1 TABLET BY MOUTH TWICE A DAY 60 tablet 0  . propranolol ER (INDERAL LA) 80 MG 24 hr capsule Take 1 capsule (80 mg total) by mouth daily. 90 capsule 4  . QVAR REDIHALER 80 MCG/ACT inhaler TAKE 1 PUFF BY MOUTH TWICE A DAY  5  . ranitidine (ZANTAC) 150 MG tablet Take 1 tablet (150 mg total) by mouth 2 (two) times daily. No more refills without office visit 180 tablet 0  . rizatriptan (MAXALT-MLT) 10 MG disintegrating tablet Take 1 tablet (10 mg total) by mouth as needed for migraine. May repeat in 2 hours if needed 9 tablet 12   No current facility-administered medications on file prior to visit.     PAST MEDICAL HISTORY: Past Medical History:  Diagnosis Date  . AC (acromioclavicular) joint bone spurs   . Allergy   . Asthma   . Bunion   . Edema, lower extremity   . GERD (gastroesophageal reflux disease)   . Heel spur    Both heels, one has fractured off  . Hypertension   . Hypothyroidism   . Migraines   . Multiple food allergies   . Polycystic ovarian syndrome   . Seasonal allergies   . Thyroid disease   . Ulnar nerve abnormality     PAST SURGICAL HISTORY: Past Surgical History:  Procedure  Laterality Date  . OOPHORECTOMY Left   . PLANTAR FASCIECTOMY Bilateral 08/2014  . TERATOMA EXCISION Left   . TUBAL LIGATION  2008    SOCIAL HISTORY: Social History   Tobacco Use  . Smoking status: Never Smoker  . Smokeless tobacco: Never Used  Substance Use Topics  . Alcohol use: Yes    Alcohol/week: 1.0 standard drinks    Types: 1 Glasses of wine per week    Comment: 1-4/month  . Drug use: No    FAMILY HISTORY: Family History  Problem Relation Age of Onset  . Thyroid disease Mother   . Allergies Mother   . Hypertension Father   . Heart disease Father   . Cancer Father        skin  . Sleep apnea Father   . Obesity Father   . Alcohol abuse Maternal Grandfather   . Hypertension Paternal Grandmother   . Hypertension Paternal Grandfather   . Diabetes Paternal Grandfather   . Hypertension  Brother   . Heart Problems Brother     ROS: Review of Systems  Constitutional: Negative for weight loss.  Respiratory: Negative for shortness of breath (on exertion).   Cardiovascular: Negative for chest pain.  Endo/Heme/Allergies:       Negative for polyphagia    PHYSICAL EXAM: Pt in no acute distress  RECENT LABS AND TESTS: BMET    Component Value Date/Time   NA 138 11/14/2018 0831   K 4.7 11/14/2018 0831   CL 103 11/14/2018 0831   CO2 19 (L) 11/14/2018 0831   GLUCOSE 86 11/14/2018 0831   GLUCOSE 80 02/10/2016 1735   BUN 15 11/14/2018 0831   CREATININE 0.85 11/14/2018 0831   CREATININE 0.67 02/10/2016 1735   CALCIUM 9.3 11/14/2018 0831   GFRNONAA 84 11/14/2018 0831   GFRAA 96 11/14/2018 0831   Lab Results  Component Value Date   HGBA1C 5.1 11/14/2018   HGBA1C 5.2 06/28/2018   HGBA1C 4.7 09/27/2013   Lab Results  Component Value Date   INSULIN 20.4 11/14/2018   INSULIN 14.2 06/28/2018   CBC    Component Value Date/Time   WBC 11.6 (H) 06/28/2018 1037   WBC 11.6 (H) 02/03/2018 1620   RBC 4.85 06/28/2018 1037   RBC 4.72 02/03/2018 1620   HGB 14.8  06/28/2018 1037   HCT 43.8 06/28/2018 1037   PLT 375.0 02/03/2018 1620   PLT 435 (H) 10/18/2017 1640   MCV 90 06/28/2018 1037   MCH 30.5 06/28/2018 1037   MCH 31.3 02/04/2015 1109   MCHC 33.8 06/28/2018 1037   MCHC 34.1 02/03/2018 1620   RDW 13.2 06/28/2018 1037   LYMPHSABS 1.9 06/28/2018 1037   MONOABS 0.7 02/03/2018 1620   EOSABS 0.3 06/28/2018 1037   BASOSABS 0.1 06/28/2018 1037   Iron/TIBC/Ferritin/ %Sat No results found for: IRON, TIBC, FERRITIN, IRONPCTSAT Lipid Panel     Component Value Date/Time   CHOL 140 11/14/2018 0831   TRIG 201 (H) 11/14/2018 0831   HDL 45 11/14/2018 0831   CHOLHDL 3.1 10/18/2017 1640   CHOLHDL 2.7 02/04/2015 1109   VLDL 23 02/04/2015 1109   LDLCALC 55 11/14/2018 0831   Hepatic Function Panel     Component Value Date/Time   PROT 7.1 11/14/2018 0831   ALBUMIN 3.9 11/14/2018 0831   AST 16 11/14/2018 0831   ALT 22 11/14/2018 0831   ALKPHOS 96 11/14/2018 0831   BILITOT <0.2 11/14/2018 0831      Component Value Date/Time   TSH 1.710 06/28/2018 1037   TSH 2.930 10/18/2017 1640   TSH 3.010 08/17/2016 1650     Ref. Range 06/28/2018 10:37  Vitamin D, 25-Hydroxy Latest Ref Range: 30.0 - 100.0 ng/mL 47.5    I, Doreene Nest, am acting as Location manager for [employer]. Owens Shark, DO  I have reviewed the above documentation for accuracy and completeness, and I agree with the above. -Jearld Lesch, DO

## 2019-02-22 ENCOUNTER — Encounter (INDEPENDENT_AMBULATORY_CARE_PROVIDER_SITE_OTHER): Payer: Self-pay | Admitting: Bariatrics

## 2019-03-06 ENCOUNTER — Encounter (INDEPENDENT_AMBULATORY_CARE_PROVIDER_SITE_OTHER): Payer: Self-pay | Admitting: Bariatrics

## 2019-03-06 ENCOUNTER — Other Ambulatory Visit: Payer: Self-pay

## 2019-03-06 ENCOUNTER — Telehealth (INDEPENDENT_AMBULATORY_CARE_PROVIDER_SITE_OTHER): Payer: 59 | Admitting: Bariatrics

## 2019-03-06 DIAGNOSIS — I10 Essential (primary) hypertension: Secondary | ICD-10-CM | POA: Diagnosis not present

## 2019-03-06 DIAGNOSIS — Z6841 Body Mass Index (BMI) 40.0 and over, adult: Secondary | ICD-10-CM | POA: Diagnosis not present

## 2019-03-06 DIAGNOSIS — E8881 Metabolic syndrome: Secondary | ICD-10-CM

## 2019-03-06 NOTE — Progress Notes (Signed)
Office: [phone]  /  Fax: [phone] TeleHealth Visit:  Erin Warren has verbally consented to this TeleHealth visit today. The patient is located at home, the provider is located at the [facility] office. The participants in this visit include the listed provider and patient. The visit was conducted today via Webex.  HPI:   Chief Complaint: OBESITY Erin Warren is here to discuss her progress with her obesity treatment plan. She is on the Category 2 plan and is following her eating plan approximately 50% of the time. She states she is walking 15 minutes 5 times per week. Erin Warren states that her weight is about the same. She states she has had more time to herself and is doing well with her water intake for the most part.  We were unable to weigh the patient today for this TeleHealth visit. She feels as if she has maintained her weight since her last visit. She has lost 5 lbs since starting treatment with [name].  Insulin Resistance Erin Warren has a diagnosis of insulin resistance based on her elevated fasting insulin level >5. Her last A1c was 5.1 on 11/14/2018 and her insulin was 20.4. Although Erin Warren's blood glucose readings are still under good control, insulin resistance puts her at greater risk of metabolic syndrome and diabetes. She is is not taking metoprolol currently and continues to work on diet and exercise to decrease risk of diabetes.  Hypertension Erin Warren is a 43 y.o. female with hypertension and is taking propranolol.  Erin Warren denies chest pain or shortness of breath on exertion. She is working weight loss to help control her blood pressure with the goal of decreasing her risk of heart attack and stroke. Erin Warren's blood pressure is currently well controlled.  ASSESSMENT AND PLAN:  Insulin resistance  Essential hypertension  Class 3 severe obesity with serious comorbidity and body mass index (BMI) of 40.0 to 44.9 in adult, unspecified obesity type (HCC)   PLAN:  Insulin Resistance Erin Warren will continue to work on weight loss, exercise, and decreasing simple carbohydrates in her diet to help decrease the risk of diabetes. We dicussed metformin including benefits and risks. She was informed that eating too many simple carbohydrates or too many calories at one sitting increases the likelihood of GI side effects. Daphnie will decrease her carbohydrates, increase her protein, and decrease snacks. She will follow-up with [name] as directed to monitor her progress.  Hypertension We discussed sodium restriction, working on healthy weight loss, and a regular exercise program as the means to achieve improved blood pressure control. Zoua agreed with this plan and agreed to follow up as directed. We will continue to monitor her blood pressure as well as her progress with the above lifestyle modifications. She will continue her medications as prescribed and will watch for signs of hypotension as she continues her lifestyle modifications.  Obesity Erin Warren is currently in the action stage of change. As such, her goal is to continue with weight loss efforts. She has agreed to follow the Category 2 plan. Erin Warren will work on meal planning, intentional eating, and will take "bad snacks" out of the house. Erin Warren has been instructed to continue walking and resume her other exercises for weight loss and overall health benefits. We discussed the following Behavioral Modification Strategies today: increasing lean protein intake, decreasing simple carbohydrates, increasing vegetables, increase H20 intake, decrease eating out, no skipping meals, work on meal planning and easy cooking plans, keeping healthy foods in the home, and planning  for success.  Erin Warren has agreed to follow-up with our clinic in 2 weeks. She was informed of the importance of frequent follow-up visits to maximize her success with intensive lifestyle modifications for her multiple health conditions.  ALLERGIES:  Allergies  Allergen Reactions  . Food Hives, Nausea Only and Swelling    WHEAT  . Garlic   . Onion   . Topamax [Topiramate] Other (See Comments)    Flushing, paresthesias (tingling, burning).      MEDICATIONS: Current Outpatient Medications on File Prior to Visit  Medication Sig Dispense Refill  . albuterol (PROVENTIL HFA;VENTOLIN HFA) 108 (90 BASE) MCG/ACT inhaler Inhale 2 puffs into the lungs every 6 (six) hours as needed. 1 Inhaler 3  . Cholecalciferol (VITAMIN D) 2000 UNITS tablet Take 2,000 Units by mouth daily.    [name] EPIPEN 2-PAK 0.3 MG/0.3ML SOAJ injection as needed.    . fluticasone (FLONASE) 50 MCG/ACT nasal spray SPRAY 2 SPRAYS INTO EACH NOSTRIL EVERY DAY 16 g 2  . levothyroxine (SYNTHROID, LEVOTHROID) 25 MCG tablet Take 1 tablet (25 mcg total) by mouth daily. 90 tablet 3  . loratadine (CLARITIN) 10 MG tablet Take 10 mg by mouth daily.    . metFORMIN (GLUCOPHAGE) 500 MG tablet TAKE 1 TABLET BY MOUTH TWICE A DAY 60 tablet 0  . propranolol ER (INDERAL LA) 80 MG 24 hr capsule Take 1 capsule (80 mg total) by mouth daily. 90 capsule 4  . QVAR REDIHALER 80 MCG/ACT inhaler TAKE 1 PUFF BY MOUTH TWICE A DAY  5  . ranitidine (ZANTAC) 150 MG tablet Take 1 tablet (150 mg total) by mouth 2 (two) times daily. No more refills without office visit 180 tablet 0  . rizatriptan (MAXALT-MLT) 10 MG disintegrating tablet Take 1 tablet (10 mg total) by mouth as needed for migraine. May repeat in 2 hours if needed 9 tablet 12   No current facility-administered medications on file prior to visit.     PAST MEDICAL HISTORY: Past Medical History:  Diagnosis Date  . AC (acromioclavicular) joint bone spurs   . Allergy   . Asthma   . Bunion   . Edema, lower extremity   . GERD (gastroesophageal reflux disease)   . Heel spur    Both heels, one has fractured off  . Hypertension   . Hypothyroidism   . Migraines   . Multiple food allergies   . Polycystic ovarian syndrome   . Seasonal allergies   .  Thyroid disease   . Ulnar nerve abnormality     PAST SURGICAL HISTORY: Past Surgical History:  Procedure Laterality Date  . OOPHORECTOMY Left   . PLANTAR FASCIECTOMY Bilateral 08/2014  . TERATOMA EXCISION Left   . TUBAL LIGATION  2008    SOCIAL HISTORY: Social History   Tobacco Use  . Smoking status: Never Smoker  . Smokeless tobacco: Never Used  Substance Use Topics  . Alcohol use: Yes    Alcohol/week: 1.0 standard drinks    Types: 1 Glasses of wine per week    Comment: 1-4/month  . Drug use: No    FAMILY HISTORY: Family History  Problem Relation Age of Onset  . Thyroid disease Mother   . Allergies Mother   . Hypertension Father   . Heart disease Father   . Cancer Father        skin  . Sleep apnea Father   . Obesity Father   . Alcohol abuse Maternal Grandfather   . Hypertension Paternal Grandmother   .  Hypertension Paternal Grandfather   . Diabetes Paternal Grandfather   . Hypertension Brother   . Heart Problems Brother    ROS: Review of Systems  Endo/Heme/Allergies:       Negative for polyphagia.   PHYSICAL EXAM: Pt in no acute distress  RECENT LABS AND TESTS: BMET    Component Value Date/Time   NA 138 11/14/2018 0831   K 4.7 11/14/2018 0831   CL 103 11/14/2018 0831   CO2 19 (L) 11/14/2018 0831   GLUCOSE 86 11/14/2018 0831   GLUCOSE 80 02/10/2016 1735   BUN 15 11/14/2018 0831   CREATININE 0.85 11/14/2018 0831   CREATININE 0.67 02/10/2016 1735   CALCIUM 9.3 11/14/2018 0831   GFRNONAA 84 11/14/2018 0831   GFRAA 96 11/14/2018 0831   Lab Results  Component Value Date   HGBA1C 5.1 11/14/2018   HGBA1C 5.2 06/28/2018   HGBA1C 4.7 09/27/2013   Lab Results  Component Value Date   INSULIN 20.4 11/14/2018   INSULIN 14.2 06/28/2018   CBC    Component Value Date/Time   WBC 11.6 (H) 06/28/2018 1037   WBC 11.6 (H) 02/03/2018 1620   RBC 4.85 06/28/2018 1037   RBC 4.72 02/03/2018 1620   HGB 14.8 06/28/2018 1037   HCT 43.8 06/28/2018 1037    PLT 375.0 02/03/2018 1620   PLT 435 (H) 10/18/2017 1640   MCV 90 06/28/2018 1037   MCH 30.5 06/28/2018 1037   MCH 31.3 02/04/2015 1109   MCHC 33.8 06/28/2018 1037   MCHC 34.1 02/03/2018 1620   RDW 13.2 06/28/2018 1037   LYMPHSABS 1.9 06/28/2018 1037   MONOABS 0.7 02/03/2018 1620   EOSABS 0.3 06/28/2018 1037   BASOSABS 0.1 06/28/2018 1037   Iron/TIBC/Ferritin/ %Sat No results found for: IRON, TIBC, FERRITIN, IRONPCTSAT Lipid Panel     Component Value Date/Time   CHOL 140 11/14/2018 0831   TRIG 201 (H) 11/14/2018 0831   HDL 45 11/14/2018 0831   CHOLHDL 3.1 10/18/2017 1640   CHOLHDL 2.7 02/04/2015 1109   VLDL 23 02/04/2015 1109   LDLCALC 55 11/14/2018 0831   Hepatic Function Panel     Component Value Date/Time   PROT 7.1 11/14/2018 0831   ALBUMIN 3.9 11/14/2018 0831   AST 16 11/14/2018 0831   ALT 22 11/14/2018 0831   ALKPHOS 96 11/14/2018 0831   BILITOT <0.2 11/14/2018 0831      Component Value Date/Time   TSH 1.710 06/28/2018 1037   TSH 2.930 10/18/2017 1640   TSH 3.010 08/17/2016 1650   Results for DEWANA, AMMIRATI (MRN [id number]) as of 03/06/2019 13:51  Ref. Range 06/28/2018 10:37  Vitamin D, 25-Hydroxy Latest Ref Range: 30.0 - 100.0 ng/mL 47.5   I, Michaelene Song, am acting as Location manager for [employer], DO  I have reviewed the above documentation for accuracy and completeness, and I agree with the above. -Jearld Lesch, DO

## 2019-03-08 ENCOUNTER — Telehealth (INDEPENDENT_AMBULATORY_CARE_PROVIDER_SITE_OTHER): Payer: 59 | Admitting: Bariatrics

## 2019-03-20 ENCOUNTER — Telehealth (INDEPENDENT_AMBULATORY_CARE_PROVIDER_SITE_OTHER): Payer: 59 | Admitting: Bariatrics

## 2019-03-20 ENCOUNTER — Encounter (INDEPENDENT_AMBULATORY_CARE_PROVIDER_SITE_OTHER): Payer: Self-pay | Admitting: Bariatrics

## 2019-03-20 ENCOUNTER — Other Ambulatory Visit: Payer: Self-pay

## 2019-03-20 DIAGNOSIS — E8881 Metabolic syndrome: Secondary | ICD-10-CM | POA: Diagnosis not present

## 2019-03-20 DIAGNOSIS — I10 Essential (primary) hypertension: Secondary | ICD-10-CM

## 2019-03-20 DIAGNOSIS — Z6835 Body mass index (BMI) 35.0-35.9, adult: Secondary | ICD-10-CM | POA: Diagnosis not present

## 2019-03-21 NOTE — Progress Notes (Signed)
Office: [phone]  /  Fax: [phone] TeleHealth Visit:  Erin Warren has verbally consented to this TeleHealth visit today. The patient is located at home, the provider is located at the [facility] office. The participants in this visit include the listed provider and patient. The visit was conducted today via Webex.  HPI:   Chief Complaint: OBESITY Erin Warren is here to discuss her progress with her obesity treatment plan. She is on the Category 2 plan and is following her eating plan approximately 50% of the time. She states she is walking 10-15 minutes 2 times per week. Erin Warren states that her weight is the same. Over the last 2 weeks she has had some sugar cravings. She reports doing well with meal planning.  We were unable to weigh the patient today for this TeleHealth visit. She feels as if she has maintained her weight since her last visit. She has lost 5 lbs since starting treatment with [name].  Insulin Resistance Journey has a diagnosis of insulin resistance based on her elevated fasting insulin level >5. Her last A1c was 5.1 on 11/14/2018 and her insulin was 20.4. Although Erin Warren's blood glucose readings are still under good control, insulin resistance puts her at greater risk of metabolic syndrome and diabetes. She is not taking metformin currently and continues to work on diet and exercise to decrease risk of diabetes. No polyphagia.  Hypertension Erin Warren is a 43 y.o. female with hypertension and is on propranolol ER.  Erin Warren denies chest pain or shortness of breath on exertion. She is working weight loss to help control her blood pressure with the goal of decreasing her risk of heart attack and stroke. Erin Warren's blood pressure is currently controlled.  ASSESSMENT AND PLAN:  Insulin resistance  Essential hypertension  Class 2 severe obesity with serious comorbidity and body mass index (BMI) of 35.0 to 35.9 in adult, unspecified obesity type (HCC)  PLAN:  Insulin Resistance Erin Warren will continue to work on weight loss, exercise, and decreasing simple carbohydrates in her diet to help decrease the risk of diabetes. We dicussed metformin including benefits and risks. She was informed that eating too many simple carbohydrates or too many calories at one sitting increases the likelihood of GI side effects. Erin Warren was instructed to decrease her carbohydrates, increase protein, increase exercise, and follow-up with [name] as directed to monitor her progress.  Hypertension We discussed sodium restriction, working on healthy weight loss, and a regular exercise program as the means to achieve improved blood pressure control. Erin Warren agreed with this plan and agreed to follow up as directed. We will continue to monitor her blood pressure as well as her progress with the above lifestyle modifications. She will continue her medications as prescribed and will watch for signs of hypotension as she continues her lifestyle modifications.  Obesity Erin Warren is currently in the action stage of change. As such, her goal is to continue with weight loss efforts. She has agreed to follow the Category 2 plan. Erin Warren will work on meal planning, intentional eating, will try to have "bad foods" out of the house, and increase her protein intake. Erin Warren has been instructed to continue to walk and increase over time for weight loss and overall health benefits. We discussed the following Behavioral Modification Strategies today: increasing lean protein intake, decreasing simple carbohydrates, increasing vegetables, increase H20 intake, decrease eating out, no skipping meals, work on meal planning and easy cooking plans, keeping healthy foods in the home, and  planning for success.  Erin Warren has agreed to follow-up with our clinic in 2 weeks. She was informed of the importance of frequent follow-up visits to maximize her success with intensive lifestyle modifications for her multiple health  conditions.  ALLERGIES: Allergies  Allergen Reactions  . Food Hives, Nausea Only and Swelling    WHEAT  . Garlic   . Onion   . Topamax [Topiramate] Other (See Comments)    Flushing, paresthesias (tingling, burning).      MEDICATIONS: Current Outpatient Medications on File Prior to Visit  Medication Sig Dispense Refill  . albuterol (PROVENTIL HFA;VENTOLIN HFA) 108 (90 BASE) MCG/ACT inhaler Inhale 2 puffs into the lungs every 6 (six) hours as needed. 1 Inhaler 3  . Cholecalciferol (VITAMIN D) 2000 UNITS tablet Take 2,000 Units by mouth daily.    [name] EPIPEN 2-PAK 0.3 MG/0.3ML SOAJ injection as needed.    . fluticasone (FLONASE) 50 MCG/ACT nasal spray SPRAY 2 SPRAYS INTO EACH NOSTRIL EVERY DAY 16 g 2  . levothyroxine (SYNTHROID, LEVOTHROID) 25 MCG tablet Take 1 tablet (25 mcg total) by mouth daily. 90 tablet 3  . loratadine (CLARITIN) 10 MG tablet Take 10 mg by mouth daily.    . metFORMIN (GLUCOPHAGE) 500 MG tablet TAKE 1 TABLET BY MOUTH TWICE A DAY 60 tablet 0  . propranolol ER (INDERAL LA) 80 MG 24 hr capsule Take 1 capsule (80 mg total) by mouth daily. 90 capsule 4  . QVAR REDIHALER 80 MCG/ACT inhaler TAKE 1 PUFF BY MOUTH TWICE A DAY  5  . ranitidine (ZANTAC) 150 MG tablet Take 1 tablet (150 mg total) by mouth 2 (two) times daily. No more refills without office visit 180 tablet 0  . rizatriptan (MAXALT-MLT) 10 MG disintegrating tablet Take 1 tablet (10 mg total) by mouth as needed for migraine. May repeat in 2 hours if needed 9 tablet 12   No current facility-administered medications on file prior to visit.     PAST MEDICAL HISTORY: Past Medical History:  Diagnosis Date  . AC (acromioclavicular) joint bone spurs   . Allergy   . Asthma   . Bunion   . Edema, lower extremity   . GERD (gastroesophageal reflux disease)   . Heel spur    Both heels, one has fractured off  . Hypertension   . Hypothyroidism   . Migraines   . Multiple food allergies   . Polycystic ovarian syndrome    . Seasonal allergies   . Thyroid disease   . Ulnar nerve abnormality     PAST SURGICAL HISTORY: Past Surgical History:  Procedure Laterality Date  . OOPHORECTOMY Left   . PLANTAR FASCIECTOMY Bilateral 08/2014  . TERATOMA EXCISION Left   . TUBAL LIGATION  2008    SOCIAL HISTORY: Social History   Tobacco Use  . Smoking status: Never Smoker  . Smokeless tobacco: Never Used  Substance Use Topics  . Alcohol use: Yes    Alcohol/week: 1.0 standard drinks    Types: 1 Glasses of wine per week    Comment: 1-4/month  . Drug use: No    FAMILY HISTORY: Family History  Problem Relation Age of Onset  . Thyroid disease Mother   . Allergies Mother   . Hypertension Father   . Heart disease Father   . Cancer Father        skin  . Sleep apnea Father   . Obesity Father   . Alcohol abuse Maternal Grandfather   . Hypertension Paternal Grandmother   .  Hypertension Paternal Grandfather   . Diabetes Paternal Grandfather   . Hypertension Brother   . Heart Problems Brother    ROS: Review of Systems  Endo/Heme/Allergies:       Negative for polyphagia.   PHYSICAL EXAM: Pt in no acute distress  RECENT LABS AND TESTS: BMET    Component Value Date/Time   NA 138 11/14/2018 0831   K 4.7 11/14/2018 0831   CL 103 11/14/2018 0831   CO2 19 (L) 11/14/2018 0831   GLUCOSE 86 11/14/2018 0831   GLUCOSE 80 02/10/2016 1735   BUN 15 11/14/2018 0831   CREATININE 0.85 11/14/2018 0831   CREATININE 0.67 02/10/2016 1735   CALCIUM 9.3 11/14/2018 0831   GFRNONAA 84 11/14/2018 0831   GFRAA 96 11/14/2018 0831   Lab Results  Component Value Date   HGBA1C 5.1 11/14/2018   HGBA1C 5.2 06/28/2018   HGBA1C 4.7 09/27/2013   Lab Results  Component Value Date   INSULIN 20.4 11/14/2018   INSULIN 14.2 06/28/2018   CBC    Component Value Date/Time   WBC 11.6 (H) 06/28/2018 1037   WBC 11.6 (H) 02/03/2018 1620   RBC 4.85 06/28/2018 1037   RBC 4.72 02/03/2018 1620   HGB 14.8 06/28/2018 1037   HCT  43.8 06/28/2018 1037   PLT 375.0 02/03/2018 1620   PLT 435 (H) 10/18/2017 1640   MCV 90 06/28/2018 1037   MCH 30.5 06/28/2018 1037   MCH 31.3 02/04/2015 1109   MCHC 33.8 06/28/2018 1037   MCHC 34.1 02/03/2018 1620   RDW 13.2 06/28/2018 1037   LYMPHSABS 1.9 06/28/2018 1037   MONOABS 0.7 02/03/2018 1620   EOSABS 0.3 06/28/2018 1037   BASOSABS 0.1 06/28/2018 1037   Iron/TIBC/Ferritin/ %Sat No results found for: IRON, TIBC, FERRITIN, IRONPCTSAT Lipid Panel     Component Value Date/Time   CHOL 140 11/14/2018 0831   TRIG 201 (H) 11/14/2018 0831   HDL 45 11/14/2018 0831   CHOLHDL 3.1 10/18/2017 1640   CHOLHDL 2.7 02/04/2015 1109   VLDL 23 02/04/2015 1109   LDLCALC 55 11/14/2018 0831   Hepatic Function Panel     Component Value Date/Time   PROT 7.1 11/14/2018 0831   ALBUMIN 3.9 11/14/2018 0831   AST 16 11/14/2018 0831   ALT 22 11/14/2018 0831   ALKPHOS 96 11/14/2018 0831   BILITOT <0.2 11/14/2018 0831      Component Value Date/Time   TSH 1.710 06/28/2018 1037   TSH 2.930 10/18/2017 1640   TSH 3.010 08/17/2016 1650   Results for TYREA, FROBERG (MRN [id number]) as of 03/21/2019 07:46  Ref. Range 06/28/2018 10:37  Vitamin D, 25-Hydroxy Latest Ref Range: 30.0 - 100.0 ng/mL 47.5   I, Michaelene Song, am acting as Location manager for [employer], DO   I have reviewed the above documentation for accuracy and completeness, and I agree with the above. -Jearld Lesch, DO

## 2019-04-03 ENCOUNTER — Other Ambulatory Visit: Payer: Self-pay

## 2019-04-03 ENCOUNTER — Telehealth (INDEPENDENT_AMBULATORY_CARE_PROVIDER_SITE_OTHER): Payer: 59 | Admitting: Bariatrics

## 2019-04-03 ENCOUNTER — Encounter (INDEPENDENT_AMBULATORY_CARE_PROVIDER_SITE_OTHER): Payer: Self-pay | Admitting: Bariatrics

## 2019-04-03 DIAGNOSIS — E038 Other specified hypothyroidism: Secondary | ICD-10-CM

## 2019-04-03 DIAGNOSIS — E8881 Metabolic syndrome: Secondary | ICD-10-CM | POA: Diagnosis not present

## 2019-04-03 DIAGNOSIS — Z6835 Body mass index (BMI) 35.0-35.9, adult: Secondary | ICD-10-CM | POA: Diagnosis not present

## 2019-04-03 NOTE — Progress Notes (Signed)
Office: [phone]  /  Fax: [phone] TeleHealth Visit:  Erin Warren has verbally consented to this TeleHealth visit today. The patient is located at home, the provider is located at the [facility] office. The participants in this visit include the listed provider and patient. The visit was conducted today via Webex.  HPI:   Chief Complaint: OBESITY Erin Warren is here to discuss her progress with her obesity treatment plan. She is on the Category 2 plan and is following her eating plan approximately 60% of the time. She states she is exercising 0 minutes 0 times per week. Erin Warren states that her weight remains the same. She has been more stressed and has decreased her exercise. She states she is doing well with her protein and water intake.  We were unable to weigh the patient today for this TeleHealth visit. She feels as if she has maintained her weight since her last visit. She has lost 5 lbs since starting treatment with [name].  Hypothyroidism Erin Warren has a diagnosis of hypothyroidism. She is taking Synthroid. She denies excessive fatigue.  Insulin Resistance Erin Warren has a diagnosis of insulin resistance based on her elevated fasting insulin level >5. She has a history of PCOS. Although Zanyla's blood glucose readings are still under good control, insulin resistance puts her at greater risk of metabolic syndrome and diabetes.  ASSESSMENT AND PLAN:  Insulin resistance  Other specified hypothyroidism  Class 2 severe obesity with serious comorbidity and body mass index (BMI) of 35.0 to 35.9 in adult, unspecified obesity type [facility])  PLAN:  Hypothyroidism Erin Warren was informed of the importance of good thyroid control to help with weight loss efforts. She was also informed that supertheraputic thyroid levels are dangerous and will not improve weight loss results. She will continue Synthroid and will follow-up as directed to monitor her progress.  Insulin Resistance Erin Warren will  continue to work on weight loss, exercise, and decreasing simple carbohydrates in her diet to help decrease the risk of diabetes. We dicussed metformin including benefits and risks. She was informed that eating too many simple carbohydrates or too many calories at one sitting increases the likelihood of GI side effects. Erin Warren will continue her medications, decrease carbohydrates, and increase protein intake.  Obesity Erin Warren is currently in the action stage of change. As such, her goal is to continue with weight loss efforts. She has agreed to follow the Category 2 plan. Erin Warren will stay active, work on meal planning, and work on goals for protein/calories and protein. Erin Warren has been instructed to work up to a goal of 150 minutes of combined cardio and strengthening exercise per week for weight loss and overall health benefits. We discussed the following Behavioral Modification Strategies today: increasing lean protein intake, decreasing simple carbohydrates, increasing vegetables, increase H20 intake, decrease eating out, no skipping meals, work on meal planning and easy cooking plans, and keeping healthy foods in the home.   Erin Warren has agreed to follow-up with our clinic in 2 weeks. She was informed of the importance of frequent follow-up visits to maximize her success with intensive lifestyle modifications for her multiple health conditions.  ALLERGIES: Allergies  Allergen Reactions  . Food Hives, Nausea Only and Swelling    WHEAT  . Garlic   . Onion   . Topamax [Topiramate] Other (See Comments)    Flushing, paresthesias (tingling, burning).      MEDICATIONS: Current Outpatient Medications on File Prior to Visit  Medication Sig Dispense Refill  . albuterol (PROVENTIL  HFA;VENTOLIN HFA) 108 (90 BASE) MCG/ACT inhaler Inhale 2 puffs into the lungs every 6 (six) hours as needed. 1 Inhaler 3  . Cholecalciferol (VITAMIN D) 2000 UNITS tablet Take 2,000 Units by mouth daily.    [name] EPIPEN 2-PAK 0.3  MG/0.3ML SOAJ injection as needed.    . fluticasone (FLONASE) 50 MCG/ACT nasal spray SPRAY 2 SPRAYS INTO EACH NOSTRIL EVERY DAY 16 g 2  . levothyroxine (SYNTHROID, LEVOTHROID) 25 MCG tablet Take 1 tablet (25 mcg total) by mouth daily. 90 tablet 3  . loratadine (CLARITIN) 10 MG tablet Take 10 mg by mouth daily.    . metFORMIN (GLUCOPHAGE) 500 MG tablet TAKE 1 TABLET BY MOUTH TWICE A DAY 60 tablet 0  . propranolol ER (INDERAL LA) 80 MG 24 hr capsule Take 1 capsule (80 mg total) by mouth daily. 90 capsule 4  . QVAR REDIHALER 80 MCG/ACT inhaler TAKE 1 PUFF BY MOUTH TWICE A DAY  5  . ranitidine (ZANTAC) 150 MG tablet Take 1 tablet (150 mg total) by mouth 2 (two) times daily. No more refills without office visit 180 tablet 0  . rizatriptan (MAXALT-MLT) 10 MG disintegrating tablet Take 1 tablet (10 mg total) by mouth as needed for migraine. May repeat in 2 hours if needed 9 tablet 12   No current facility-administered medications on file prior to visit.     PAST MEDICAL HISTORY: Past Medical History:  Diagnosis Date  . AC (acromioclavicular) joint bone spurs   . Allergy   . Asthma   . Bunion   . Edema, lower extremity   . GERD (gastroesophageal reflux disease)   . Heel spur    Both heels, one has fractured off  . Hypertension   . Hypothyroidism   . Migraines   . Multiple food allergies   . Polycystic ovarian syndrome   . Seasonal allergies   . Thyroid disease   . Ulnar nerve abnormality     PAST SURGICAL HISTORY: Past Surgical History:  Procedure Laterality Date  . OOPHORECTOMY Left   . PLANTAR FASCIECTOMY Bilateral 08/2014  . TERATOMA EXCISION Left   . TUBAL LIGATION  2008    SOCIAL HISTORY: Social History   Tobacco Use  . Smoking status: Never Smoker  . Smokeless tobacco: Never Used  Substance Use Topics  . Alcohol use: Yes    Alcohol/week: 1.0 standard drinks    Types: 1 Glasses of wine per week    Comment: 1-4/month  . Drug use: No    FAMILY HISTORY: Family  History  Problem Relation Age of Onset  . Thyroid disease Mother   . Allergies Mother   . Hypertension Father   . Heart disease Father   . Cancer Father        skin  . Sleep apnea Father   . Obesity Father   . Alcohol abuse Maternal Grandfather   . Hypertension Paternal Grandmother   . Hypertension Paternal Grandfather   . Diabetes Paternal Grandfather   . Hypertension Brother   . Heart Problems Brother    ROS: Review of Systems  Constitutional: Negative for malaise/fatigue.   PHYSICAL EXAM: Pt in no acute distress  RECENT LABS AND TESTS: BMET    Component Value Date/Time   NA 138 11/14/2018 0831   K 4.7 11/14/2018 0831   CL 103 11/14/2018 0831   CO2 19 (L) 11/14/2018 0831   GLUCOSE 86 11/14/2018 0831   GLUCOSE 80 02/10/2016 1735   BUN 15 11/14/2018 0831   CREATININE 0.85  11/14/2018 0831   CREATININE 0.67 02/10/2016 1735   CALCIUM 9.3 11/14/2018 0831   GFRNONAA 84 11/14/2018 0831   GFRAA 96 11/14/2018 0831   Lab Results  Component Value Date   HGBA1C 5.1 11/14/2018   HGBA1C 5.2 06/28/2018   HGBA1C 4.7 09/27/2013   Lab Results  Component Value Date   INSULIN 20.4 11/14/2018   INSULIN 14.2 06/28/2018   CBC    Component Value Date/Time   WBC 11.6 (H) 06/28/2018 1037   WBC 11.6 (H) 02/03/2018 1620   RBC 4.85 06/28/2018 1037   RBC 4.72 02/03/2018 1620   HGB 14.8 06/28/2018 1037   HCT 43.8 06/28/2018 1037   PLT 375.0 02/03/2018 1620   PLT 435 (H) 10/18/2017 1640   MCV 90 06/28/2018 1037   MCH 30.5 06/28/2018 1037   MCH 31.3 02/04/2015 1109   MCHC 33.8 06/28/2018 1037   MCHC 34.1 02/03/2018 1620   RDW 13.2 06/28/2018 1037   LYMPHSABS 1.9 06/28/2018 1037   MONOABS 0.7 02/03/2018 1620   EOSABS 0.3 06/28/2018 1037   BASOSABS 0.1 06/28/2018 1037   Iron/TIBC/Ferritin/ %Sat No results found for: IRON, TIBC, FERRITIN, IRONPCTSAT Lipid Panel     Component Value Date/Time   CHOL 140 11/14/2018 0831   TRIG 201 (H) 11/14/2018 0831   HDL 45 11/14/2018  0831   CHOLHDL 3.1 10/18/2017 1640   CHOLHDL 2.7 02/04/2015 1109   VLDL 23 02/04/2015 1109   LDLCALC 55 11/14/2018 0831   Hepatic Function Panel     Component Value Date/Time   PROT 7.1 11/14/2018 0831   ALBUMIN 3.9 11/14/2018 0831   AST 16 11/14/2018 0831   ALT 22 11/14/2018 0831   ALKPHOS 96 11/14/2018 0831   BILITOT <0.2 11/14/2018 0831      Component Value Date/Time   TSH 1.710 06/28/2018 1037   TSH 2.930 10/18/2017 1640   TSH 3.010 08/17/2016 1650   Results for JUNKO, OHAGAN (MRN [id number]) as of 04/03/2019 15:06  Ref. Range 06/28/2018 10:37  Vitamin D, 25-Hydroxy Latest Ref Range: 30.0 - 100.0 ng/mL 47.5   I, Michaelene Song, am acting as Location manager for [employer], DO  I have reviewed the above documentation for accuracy and completeness, and I agree with the above. -Jearld Lesch, DO

## 2019-04-17 ENCOUNTER — Telehealth (INDEPENDENT_AMBULATORY_CARE_PROVIDER_SITE_OTHER): Payer: 59 | Admitting: Bariatrics

## 2019-04-17 ENCOUNTER — Encounter (INDEPENDENT_AMBULATORY_CARE_PROVIDER_SITE_OTHER): Payer: Self-pay | Admitting: Bariatrics

## 2019-04-17 ENCOUNTER — Other Ambulatory Visit: Payer: Self-pay

## 2019-04-17 DIAGNOSIS — E8881 Metabolic syndrome: Secondary | ICD-10-CM

## 2019-04-17 DIAGNOSIS — Z6839 Body mass index (BMI) 39.0-39.9, adult: Secondary | ICD-10-CM | POA: Diagnosis not present

## 2019-04-18 NOTE — Progress Notes (Signed)
Office: [phone]  /  Fax: [phone] TeleHealth Visit:  Erin Warren has verbally consented to this TeleHealth visit today. The patient is located at home, the provider is located at the [employer] [facility] office. The participants in this visit include the listed provider and patient and any and all parties involved. The visit was conducted today via WebEx.  HPI:   Chief Complaint: OBESITY Erin Warren is here to discuss her progress with her obesity treatment plan. She is on the Category 2 plan and is following her eating plan approximately 60 % of the time. She states she is doing yoga 15 to 20 minutes 4 times per week. Erin Warren states that her weight remains the same (weight not reported). She denies any specific struggles. She is drinking an adequate amount of water. Erin Warren is staying away from snacks. We were unable to weigh the patient today for this TeleHealth visit. She feels as if she has maintained weight since her last visit. She has lost 5 lbs since starting treatment with [name].  Insulin Resistance Erin Warren has a diagnosis of insulin resistance based on her elevated fasting insulin level >5. Although Erin Warren's blood glucose readings are still under good control, insulin resistance puts her at greater risk of metabolic syndrome and diabetes. She continues to work on diet and exercise to decrease risk of diabetes. Erin Warren denies polyphagia.  ASSESSMENT AND PLAN:  Insulin resistance  Class 2 severe obesity with serious comorbidity and body mass index (BMI) of 39.0 to 39.9 in adult, unspecified obesity type ([place])  PLAN:  Insulin Resistance Erin Warren will continue to work on weight loss, exercise, increasing lean protein and decreasing simple carbohydrates in her diet to help decrease the risk of diabetes. She was informed that eating too many simple carbohydrates or too many calories at one sitting increases the likelihood of GI side effects. Erin Warren agreed to follow up with [name] as directed to  monitor her progress.  Obesity Erin Warren is currently in the action stage of change. As such, her goal is to continue with weight loss efforts She has agreed to follow the Category 2 plan Erin Warren will continue doing yoga 15 to 20 minutes 4 days per week for weight loss and overall health benefits. We discussed the following Behavioral Modification Strategies today: increase H2O intake, no skipping meals, keeping healthy foods in the home, increasing lean protein intake, decreasing simple carbohydrates, increasing vegetables, decrease eating out and work on meal planning and intentional eating Erin Warren will start tracking more (goal).  Erin Warren has agreed to follow up with our clinic in 2 weeks. She was informed of the importance of frequent follow up visits to maximize her success with intensive lifestyle modifications for her multiple health conditions.  ALLERGIES: Allergies  Allergen Reactions  . Food Hives, Nausea Only and Swelling    WHEAT  . Garlic   . Onion   . Topamax [Topiramate] Other (See Comments)    Flushing, paresthesias (tingling, burning).      MEDICATIONS: Current Outpatient Medications on File Prior to Visit  Medication Sig Dispense Refill  . albuterol (PROVENTIL HFA;VENTOLIN HFA) 108 (90 BASE) MCG/ACT inhaler Inhale 2 puffs into the lungs every 6 (six) hours as needed. 1 Inhaler 3  . Cholecalciferol (VITAMIN D) 2000 UNITS tablet Take 2,000 Units by mouth daily.    [name] EPIPEN 2-PAK 0.3 MG/0.3ML SOAJ injection as needed.    . fluticasone (FLONASE) 50 MCG/ACT nasal spray SPRAY 2 SPRAYS INTO EACH NOSTRIL EVERY DAY 16 g 2  .  levothyroxine (SYNTHROID, LEVOTHROID) 25 MCG tablet Take 1 tablet (25 mcg total) by mouth daily. 90 tablet 3  . loratadine (CLARITIN) 10 MG tablet Take 10 mg by mouth daily.    . metFORMIN (GLUCOPHAGE) 500 MG tablet TAKE 1 TABLET BY MOUTH TWICE A DAY 60 tablet 0  . propranolol ER (INDERAL LA) 80 MG 24 hr capsule Take 1 capsule (80 mg total) by mouth daily. 90  capsule 4  . QVAR REDIHALER 80 MCG/ACT inhaler TAKE 1 PUFF BY MOUTH TWICE A DAY  5  . ranitidine (ZANTAC) 150 MG tablet Take 1 tablet (150 mg total) by mouth 2 (two) times daily. No more refills without office visit 180 tablet 0  . rizatriptan (MAXALT-MLT) 10 MG disintegrating tablet Take 1 tablet (10 mg total) by mouth as needed for migraine. May repeat in 2 hours if needed 9 tablet 12   No current facility-administered medications on file prior to visit.     PAST MEDICAL HISTORY: Past Medical History:  Diagnosis Date  . AC (acromioclavicular) joint bone spurs   . Allergy   . Asthma   . Bunion   . Edema, lower extremity   . GERD (gastroesophageal reflux disease)   . Heel spur    Both heels, one has fractured off  . Hypertension   . Hypothyroidism   . Migraines   . Multiple food allergies   . Polycystic ovarian syndrome   . Seasonal allergies   . Thyroid disease   . Ulnar nerve abnormality     PAST SURGICAL HISTORY: Past Surgical History:  Procedure Laterality Date  . OOPHORECTOMY Left   . PLANTAR FASCIECTOMY Bilateral 08/2014  . TERATOMA EXCISION Left   . TUBAL LIGATION  2008    SOCIAL HISTORY: Social History   Tobacco Use  . Smoking status: Never Smoker  . Smokeless tobacco: Never Used  Substance Use Topics  . Alcohol use: Yes    Alcohol/week: 1.0 standard drinks    Types: 1 Glasses of wine per week    Comment: 1-4/month  . Drug use: No    FAMILY HISTORY: Family History  Problem Relation Age of Onset  . Thyroid disease Mother   . Allergies Mother   . Hypertension Father   . Heart disease Father   . Cancer Father        skin  . Sleep apnea Father   . Obesity Father   . Alcohol abuse Maternal Grandfather   . Hypertension Paternal Grandmother   . Hypertension Paternal Grandfather   . Diabetes Paternal Grandfather   . Hypertension Brother   . Heart Problems Brother     ROS: Review of Systems  Constitutional: Negative for weight loss.   Endo/Heme/Allergies:       Negative for polyphagia    PHYSICAL EXAM: Pt in no acute distress  RECENT LABS AND TESTS: BMET    Component Value Date/Time   NA 138 11/14/2018 0831   K 4.7 11/14/2018 0831   CL 103 11/14/2018 0831   CO2 19 (L) 11/14/2018 0831   GLUCOSE 86 11/14/2018 0831   GLUCOSE 80 02/10/2016 1735   BUN 15 11/14/2018 0831   CREATININE 0.85 11/14/2018 0831   CREATININE 0.67 02/10/2016 1735   CALCIUM 9.3 11/14/2018 0831   GFRNONAA 84 11/14/2018 0831   GFRAA 96 11/14/2018 0831   Lab Results  Component Value Date   HGBA1C 5.1 11/14/2018   HGBA1C 5.2 06/28/2018   HGBA1C 4.7 09/27/2013   Lab Results  Component Value Date  INSULIN 20.4 11/14/2018   INSULIN 14.2 06/28/2018   CBC    Component Value Date/Time   WBC 11.6 (H) 06/28/2018 1037   WBC 11.6 (H) 02/03/2018 1620   RBC 4.85 06/28/2018 1037   RBC 4.72 02/03/2018 1620   HGB 14.8 06/28/2018 1037   HCT 43.8 06/28/2018 1037   PLT 375.0 02/03/2018 1620   PLT 435 (H) 10/18/2017 1640   MCV 90 06/28/2018 1037   MCH 30.5 06/28/2018 1037   MCH 31.3 02/04/2015 1109   MCHC 33.8 06/28/2018 1037   MCHC 34.1 02/03/2018 1620   RDW 13.2 06/28/2018 1037   LYMPHSABS 1.9 06/28/2018 1037   MONOABS 0.7 02/03/2018 1620   EOSABS 0.3 06/28/2018 1037   BASOSABS 0.1 06/28/2018 1037   Iron/TIBC/Ferritin/ %Sat No results found for: IRON, TIBC, FERRITIN, IRONPCTSAT Lipid Panel     Component Value Date/Time   CHOL 140 11/14/2018 0831   TRIG 201 (H) 11/14/2018 0831   HDL 45 11/14/2018 0831   CHOLHDL 3.1 10/18/2017 1640   CHOLHDL 2.7 02/04/2015 1109   VLDL 23 02/04/2015 1109   LDLCALC 55 11/14/2018 0831   Hepatic Function Panel     Component Value Date/Time   PROT 7.1 11/14/2018 0831   ALBUMIN 3.9 11/14/2018 0831   AST 16 11/14/2018 0831   ALT 22 11/14/2018 0831   ALKPHOS 96 11/14/2018 0831   BILITOT <0.2 11/14/2018 0831      Component Value Date/Time   TSH 1.710 06/28/2018 1037   TSH 2.930 10/18/2017 1640    TSH 3.010 08/17/2016 1650     Ref. Range 06/28/2018 10:37  Vitamin D, 25-Hydroxy Latest Ref Range: 30.0 - 100.0 ng/mL 47.5    I, Doreene Nest, am acting as Location manager for [employer]. Owens Shark, DO  I have reviewed the above documentation for accuracy and completeness, and I agree with the above. -Jearld Lesch, DO

## 2019-05-01 ENCOUNTER — Other Ambulatory Visit: Payer: Self-pay

## 2019-05-01 ENCOUNTER — Telehealth (INDEPENDENT_AMBULATORY_CARE_PROVIDER_SITE_OTHER): Payer: 59 | Admitting: Bariatrics

## 2019-05-01 ENCOUNTER — Encounter (INDEPENDENT_AMBULATORY_CARE_PROVIDER_SITE_OTHER): Payer: Self-pay | Admitting: Bariatrics

## 2019-05-01 DIAGNOSIS — E038 Other specified hypothyroidism: Secondary | ICD-10-CM | POA: Diagnosis not present

## 2019-05-01 DIAGNOSIS — E8881 Metabolic syndrome: Secondary | ICD-10-CM | POA: Diagnosis not present

## 2019-05-01 DIAGNOSIS — Z6835 Body mass index (BMI) 35.0-35.9, adult: Secondary | ICD-10-CM

## 2019-05-03 NOTE — Progress Notes (Signed)
Office: [phone]  /  Fax: [phone] TeleHealth Visit:  Erin Warren has verbally consented to this TeleHealth visit today. The patient is located at home, the provider is located at the [facility] office. The participants in this visit include the listed provider and patient and any and all parties involved. The visit was conducted today via WebEx.  HPI:   Chief Complaint: OBESITY Erin Warren is here to discuss her progress with her obesity treatment plan. She is on the Category 2 plan and is following her eating plan approximately 60 % of the time. She states she is doing yoga and Tai Chi 20 minutes 3 times per week. Erin Warren states that her weight remains the same (weight not reported). She is doing well with her water and she is doing well with her protein. We were unable to weigh the patient today for this TeleHealth visit. She feels as if she has maintained weight since her last visit. She has lost 5 lbs since starting treatment with [name].  Insulin Resistance Erin Warren has a diagnosis of insulin resistance based on her elevated fasting insulin level >5. Although Erin Warren's blood glucose readings are still under good control, insulin resistance puts her at greater risk of metabolic syndrome and diabetes. She is not on medications currently and continues to work on diet and exercise to decrease risk of diabetes. [name] denies polyphagia.  Hypothyroidism Erin Warren has a diagnosis of hypothyroidism. She is on levothyroxine. She denies hot or cold intolerance or palpitations.  ASSESSMENT AND PLAN:  Insulin resistance  Other specified hypothyroidism  Class 2 severe obesity with serious comorbidity and body mass index (BMI) of 35.0 to 35.9 in adult, unspecified obesity type (Erin Warren)  PLAN:  Insulin Resistance Sherril will continue to work on weight loss, exercise, increasing lean protein and decreasing simple carbohydrates in her diet to help decrease the risk of diabetes. She was informed  that eating too many simple carbohydrates or too many calories at one sitting increases the likelihood of GI side effects. Erin Warren agreed to follow up with [name] as directed to monitor her progress.  Hypothyroidism Erin Warren was informed of the importance of good thyroid control to help with weight loss efforts. She was also informed that supertherapeutic thyroid levels are dangerous and will not improve weight loss results. Erin Warren will continue levothyroxine and follow up as directed.  Obesity Erin Warren is currently in the action stage of change. As such, her goal is to continue with weight loss efforts She has agreed to follow the Category 2 plan Erin Warren will continue Tai Chi 20 minutes, 3 times weekly ([employer] at work) and do [employer] for weight loss and overall health benefits. We discussed the following Behavioral Modification Strategies today: planning for success, increase H2O intake, no skipping meals, keeping healthy foods in the home, increasing lean protein intake, decreasing simple carbohydrates, increasing vegetables, decrease eating out and work on meal planning and easy cooking plans Erin Warren will stick to the plan more closely and she will consider tracking more this week.  Erin Warren has agreed to follow up with our clinic in 3 to 4 weeks. She was informed of the importance of frequent follow up visits to maximize her success with intensive lifestyle modifications for her multiple health conditions.  ALLERGIES: Allergies  Allergen Reactions  . Food Hives, Nausea Only and Swelling    WHEAT  . Garlic   . Onion   . Topamax [Topiramate] Other (See Comments)    Flushing, paresthesias (tingling, burning).  MEDICATIONS: Current Outpatient Medications on File Prior to Visit  Medication Sig Dispense Refill  . albuterol (PROVENTIL HFA;VENTOLIN HFA) 108 (90 BASE) MCG/ACT inhaler Inhale 2 puffs into the lungs every 6 (six) hours as needed. 1 Inhaler 3  . Cholecalciferol (VITAMIN  D) 2000 UNITS tablet Take 2,000 Units by mouth daily.    [name] EPIPEN 2-PAK 0.3 MG/0.3ML SOAJ injection as needed.    . fluticasone (FLONASE) 50 MCG/ACT nasal spray SPRAY 2 SPRAYS INTO EACH NOSTRIL EVERY DAY 16 g 2  . levothyroxine (SYNTHROID, LEVOTHROID) 25 MCG tablet Take 1 tablet (25 mcg total) by mouth daily. 90 tablet 3  . loratadine (CLARITIN) 10 MG tablet Take 10 mg by mouth daily.    . metFORMIN (GLUCOPHAGE) 500 MG tablet TAKE 1 TABLET BY MOUTH TWICE A DAY 60 tablet 0  . propranolol ER (INDERAL LA) 80 MG 24 hr capsule Take 1 capsule (80 mg total) by mouth daily. 90 capsule 4  . QVAR REDIHALER 80 MCG/ACT inhaler TAKE 1 PUFF BY MOUTH TWICE A DAY  5  . ranitidine (ZANTAC) 150 MG tablet Take 1 tablet (150 mg total) by mouth 2 (two) times daily. No more refills without office visit 180 tablet 0  . rizatriptan (MAXALT-MLT) 10 MG disintegrating tablet Take 1 tablet (10 mg total) by mouth as needed for migraine. May repeat in 2 hours if needed 9 tablet 12   No current facility-administered medications on file prior to visit.     PAST MEDICAL HISTORY: Past Medical History:  Diagnosis Date  . AC (acromioclavicular) joint bone spurs   . Allergy   . Asthma   . Bunion   . Edema, lower extremity   . GERD (gastroesophageal reflux disease)   . Heel spur    Both heels, one has fractured off  . Hypertension   . Hypothyroidism   . Migraines   . Multiple food allergies   . Polycystic ovarian syndrome   . Seasonal allergies   . Thyroid disease   . Ulnar nerve abnormality     PAST SURGICAL HISTORY: Past Surgical History:  Procedure Laterality Date  . OOPHORECTOMY Left   . PLANTAR FASCIECTOMY Bilateral 08/2014  . TERATOMA EXCISION Left   . TUBAL LIGATION  2008    SOCIAL HISTORY: Social History   Tobacco Use  . Smoking status: Never Smoker  . Smokeless tobacco: Never Used  Substance Use Topics  . Alcohol use: Yes    Alcohol/week: 1.0 standard drinks    Types: 1 Glasses of wine per  week    Comment: 1-4/month  . Drug use: No    FAMILY HISTORY: Family History  Problem Relation Age of Onset  . Thyroid disease Mother   . Allergies Mother   . Hypertension Father   . Heart disease Father   . Cancer Father        skin  . Sleep apnea Father   . Obesity Father   . Alcohol abuse Maternal Grandfather   . Hypertension Paternal Grandmother   . Hypertension Paternal Grandfather   . Diabetes Paternal Grandfather   . Hypertension Brother   . Heart Problems Brother     ROS: Review of Systems  Constitutional: Negative for weight loss.  Cardiovascular: Negative for palpitations.  Endo/Heme/Allergies:       Negative for hot or cold intolerance Negative for polyphagia    PHYSICAL EXAM: Pt in no acute distress  RECENT LABS AND TESTS: BMET    Component Value Date/Time   NA 138  11/14/2018 0831   K 4.7 11/14/2018 0831   CL 103 11/14/2018 0831   CO2 19 (L) 11/14/2018 0831   GLUCOSE 86 11/14/2018 0831   GLUCOSE 80 02/10/2016 1735   BUN 15 11/14/2018 0831   CREATININE 0.85 11/14/2018 0831   CREATININE 0.67 02/10/2016 1735   CALCIUM 9.3 11/14/2018 0831   GFRNONAA 84 11/14/2018 0831   GFRAA 96 11/14/2018 0831   Lab Results  Component Value Date   HGBA1C 5.1 11/14/2018   HGBA1C 5.2 06/28/2018   HGBA1C 4.7 09/27/2013   Lab Results  Component Value Date   INSULIN 20.4 11/14/2018   INSULIN 14.2 06/28/2018   CBC    Component Value Date/Time   WBC 11.6 (H) 06/28/2018 1037   WBC 11.6 (H) 02/03/2018 1620   RBC 4.85 06/28/2018 1037   RBC 4.72 02/03/2018 1620   HGB 14.8 06/28/2018 1037   HCT 43.8 06/28/2018 1037   PLT 375.0 02/03/2018 1620   PLT 435 (H) 10/18/2017 1640   MCV 90 06/28/2018 1037   MCH 30.5 06/28/2018 1037   MCH 31.3 02/04/2015 1109   MCHC 33.8 06/28/2018 1037   MCHC 34.1 02/03/2018 1620   RDW 13.2 06/28/2018 1037   LYMPHSABS 1.9 06/28/2018 1037   MONOABS 0.7 02/03/2018 1620   EOSABS 0.3 06/28/2018 1037   BASOSABS 0.1 06/28/2018 1037    Iron/TIBC/Ferritin/ %Sat No results found for: IRON, TIBC, FERRITIN, IRONPCTSAT Lipid Panel     Component Value Date/Time   CHOL 140 11/14/2018 0831   TRIG 201 (H) 11/14/2018 0831   HDL 45 11/14/2018 0831   CHOLHDL 3.1 10/18/2017 1640   CHOLHDL 2.7 02/04/2015 1109   VLDL 23 02/04/2015 1109   LDLCALC 55 11/14/2018 0831   Hepatic Function Panel     Component Value Date/Time   PROT 7.1 11/14/2018 0831   ALBUMIN 3.9 11/14/2018 0831   AST 16 11/14/2018 0831   ALT 22 11/14/2018 0831   ALKPHOS 96 11/14/2018 0831   BILITOT <0.2 11/14/2018 0831      Component Value Date/Time   TSH 1.710 06/28/2018 1037   TSH 2.930 10/18/2017 1640   TSH 3.010 08/17/2016 1650     Ref. Range 06/28/2018 10:37  Vitamin D, 25-Hydroxy Latest Ref Range: 30.0 - 100.0 ng/mL 47.5    I, Doreene Nest, am acting as Location manager for [employer]. Owens Shark, DO  I have reviewed the above documentation for accuracy and completeness, and I agree with the above. -Jearld Lesch, DO

## 2019-05-22 ENCOUNTER — Encounter (INDEPENDENT_AMBULATORY_CARE_PROVIDER_SITE_OTHER): Payer: Self-pay | Admitting: Bariatrics

## 2019-05-22 ENCOUNTER — Telehealth (INDEPENDENT_AMBULATORY_CARE_PROVIDER_SITE_OTHER): Payer: 59 | Admitting: Bariatrics

## 2019-05-22 ENCOUNTER — Other Ambulatory Visit: Payer: Self-pay

## 2019-05-22 DIAGNOSIS — Z6839 Body mass index (BMI) 39.0-39.9, adult: Secondary | ICD-10-CM

## 2019-05-22 DIAGNOSIS — E038 Other specified hypothyroidism: Secondary | ICD-10-CM

## 2019-05-22 DIAGNOSIS — E8881 Metabolic syndrome: Secondary | ICD-10-CM | POA: Diagnosis not present

## 2019-05-24 NOTE — Progress Notes (Signed)
Office: [phone]  /  Fax: [phone] TeleHealth Visit:  Erin Warren has verbally consented to this TeleHealth visit today. The patient is located at home, the provider is located at the [facility] office. The participants in this visit include the listed provider and patient. The visit was conducted today via webex.  HPI:   Chief Complaint: OBESITY Erin Warren is here to discuss her progress with her obesity treatment plan. She is on the Category 2 plan and is following her eating plan approximately 75-80 % of the time. She states she is doing yoga for 15 minutes, tai chi for 15 minutes, and aerobics for 30 minutes 5 times per week. Erin Warren states that her weight remains the same. She is trying to keep to her exercise schedule. She has not been able to keep calorie count lower than 1500.  We were unable to weigh the patient today for this TeleHealth visit. She feels as if she has maintained her weight since her last visit. She has lost 5 lbs since starting treatment with [name].  Insulin Resistance Erin Warren has a diagnosis of insulin resistance based on her elevated fasting insulin level >5. Although Erin Warren's blood glucose readings are still under good control, insulin resistance puts her at greater risk of metabolic syndrome and diabetes. She is taking metformin currently and continues to work on diet and exercise to decrease risk of diabetes. She denies polyphagia.  Hypothyroidism Erin Warren has a diagnosis of hypothyroidism. She is currently on levothyroxine. Her levels are well controlled. She denies hot or cold intolerance or palpitations.  ASSESSMENT AND PLAN:  Insulin resistance  Other specified hypothyroidism  Class 2 severe obesity with serious comorbidity and body mass index (BMI) of 39.0 to 39.9 in adult, unspecified obesity type ([place])  PLAN:  Insulin Resistance Erin Warren will continue to work on weight loss, exercise, increase protein, and decreasing simple carbohydrates in  her diet to help decrease the risk of diabetes. We dicussed metformin including benefits and risks. She was informed that eating too many simple carbohydrates or too many calories at one sitting increases the likelihood of GI side effects. [name] agrees to continue taking metformin, and she agrees to follow up with our clinic in 2 weeks. as directed to monitor her progress.  Hypothyroidism Erin Warren was informed of the importance of good thyroid control to help with weight loss efforts. She was also informed that supertheraputic thyroid levels are dangerous and will not improve weight loss results. Erin Warren will continue to follow up with her primary care physician.  Obesity Erin Warren is currently in the action stage of change. As such, her goal is to continue with weight loss efforts She has agreed to follow the Category 2 plan Erin Warren has been instructed to work up to a goal of 150 minutes of combined cardio and strengthening exercise per week or current exercise for weight loss and overall health benefits. She will mix up variety (Youtube videos). We discussed the following Behavioral Modification Strategies today: increasing lean protein intake, decreasing simple carbohydrates, increasing vegetables, decrease eating out, increase H20 intake, no skipping meals, work on meal planning and easy cooking plans, keeping healthy foods in the home, and planning for success She will work on getting adequate sleep.  Erin Warren has agreed to follow up with our clinic in 2 weeks. She was informed of the importance of frequent follow up visits to maximize her success with intensive lifestyle modifications for her multiple health conditions.  ALLERGIES: Allergies  Allergen Reactions  .  Food Hives, Nausea Only and Swelling    WHEAT  . Garlic   . Onion   . Topamax [Topiramate] Other (See Comments)    Flushing, paresthesias (tingling, burning).      MEDICATIONS: Current Outpatient Medications on File Prior to Visit   Medication Sig Dispense Refill  . albuterol (PROVENTIL HFA;VENTOLIN HFA) 108 (90 BASE) MCG/ACT inhaler Inhale 2 puffs into the lungs every 6 (six) hours as needed. 1 Inhaler 3  . Cholecalciferol (VITAMIN D) 2000 UNITS tablet Take 2,000 Units by mouth daily.    [name] EPIPEN 2-PAK 0.3 MG/0.3ML SOAJ injection as needed.    . fluticasone (FLONASE) 50 MCG/ACT nasal spray SPRAY 2 SPRAYS INTO EACH NOSTRIL EVERY DAY 16 g 2  . levothyroxine (SYNTHROID, LEVOTHROID) 25 MCG tablet Take 1 tablet (25 mcg total) by mouth daily. 90 tablet 3  . loratadine (CLARITIN) 10 MG tablet Take 10 mg by mouth daily.    . metFORMIN (GLUCOPHAGE) 500 MG tablet TAKE 1 TABLET BY MOUTH TWICE A DAY 60 tablet 0  . propranolol ER (INDERAL LA) 80 MG 24 hr capsule Take 1 capsule (80 mg total) by mouth daily. 90 capsule 4  . QVAR REDIHALER 80 MCG/ACT inhaler TAKE 1 PUFF BY MOUTH TWICE A DAY  5  . ranitidine (ZANTAC) 150 MG tablet Take 1 tablet (150 mg total) by mouth 2 (two) times daily. No more refills without office visit 180 tablet 0  . rizatriptan (MAXALT-MLT) 10 MG disintegrating tablet Take 1 tablet (10 mg total) by mouth as needed for migraine. May repeat in 2 hours if needed 9 tablet 12   No current facility-administered medications on file prior to visit.     PAST MEDICAL HISTORY: Past Medical History:  Diagnosis Date  . AC (acromioclavicular) joint bone spurs   . Allergy   . Asthma   . Bunion   . Edema, lower extremity   . GERD (gastroesophageal reflux disease)   . Heel spur    Both heels, one has fractured off  . Hypertension   . Hypothyroidism   . Migraines   . Multiple food allergies   . Polycystic ovarian syndrome   . Seasonal allergies   . Thyroid disease   . Ulnar nerve abnormality     PAST SURGICAL HISTORY: Past Surgical History:  Procedure Laterality Date  . OOPHORECTOMY Left   . PLANTAR FASCIECTOMY Bilateral 08/2014  . TERATOMA EXCISION Left   . TUBAL LIGATION  2008    SOCIAL HISTORY: Social  History   Tobacco Use  . Smoking status: Never Smoker  . Smokeless tobacco: Never Used  Substance Use Topics  . Alcohol use: Yes    Alcohol/week: 1.0 standard drinks    Types: 1 Glasses of wine per week    Comment: 1-4/month  . Drug use: No    FAMILY HISTORY: Family History  Problem Relation Age of Onset  . Thyroid disease Mother   . Allergies Mother   . Hypertension Father   . Heart disease Father   . Cancer Father        skin  . Sleep apnea Father   . Obesity Father   . Alcohol abuse Maternal Grandfather   . Hypertension Paternal Grandmother   . Hypertension Paternal Grandfather   . Diabetes Paternal Grandfather   . Hypertension Brother   . Heart Problems Brother     ROS: Review of Systems  Constitutional: Negative for weight loss.  Cardiovascular: Negative for palpitations.  Endo/Heme/Allergies:  Negative polyphagia Negative hot/cold intolerance    PHYSICAL EXAM: Pt in no acute distress  RECENT LABS AND TESTS: BMET    Component Value Date/Time   NA 138 11/14/2018 0831   K 4.7 11/14/2018 0831   CL 103 11/14/2018 0831   CO2 19 (L) 11/14/2018 0831   GLUCOSE 86 11/14/2018 0831   GLUCOSE 80 02/10/2016 1735   BUN 15 11/14/2018 0831   CREATININE 0.85 11/14/2018 0831   CREATININE 0.67 02/10/2016 1735   CALCIUM 9.3 11/14/2018 0831   GFRNONAA 84 11/14/2018 0831   GFRAA 96 11/14/2018 0831   Lab Results  Component Value Date   HGBA1C 5.1 11/14/2018   HGBA1C 5.2 06/28/2018   HGBA1C 4.7 09/27/2013   Lab Results  Component Value Date   INSULIN 20.4 11/14/2018   INSULIN 14.2 06/28/2018   CBC    Component Value Date/Time   WBC 11.6 (H) 06/28/2018 1037   WBC 11.6 (H) 02/03/2018 1620   RBC 4.85 06/28/2018 1037   RBC 4.72 02/03/2018 1620   HGB 14.8 06/28/2018 1037   HCT 43.8 06/28/2018 1037   PLT 375.0 02/03/2018 1620   PLT 435 (H) 10/18/2017 1640   MCV 90 06/28/2018 1037   MCH 30.5 06/28/2018 1037   MCH 31.3 02/04/2015 1109   MCHC 33.8  06/28/2018 1037   MCHC 34.1 02/03/2018 1620   RDW 13.2 06/28/2018 1037   LYMPHSABS 1.9 06/28/2018 1037   MONOABS 0.7 02/03/2018 1620   EOSABS 0.3 06/28/2018 1037   BASOSABS 0.1 06/28/2018 1037   Iron/TIBC/Ferritin/ %Sat No results found for: IRON, TIBC, FERRITIN, IRONPCTSAT Lipid Panel     Component Value Date/Time   CHOL 140 11/14/2018 0831   TRIG 201 (H) 11/14/2018 0831   HDL 45 11/14/2018 0831   CHOLHDL 3.1 10/18/2017 1640   CHOLHDL 2.7 02/04/2015 1109   VLDL 23 02/04/2015 1109   LDLCALC 55 11/14/2018 0831   Hepatic Function Panel     Component Value Date/Time   PROT 7.1 11/14/2018 0831   ALBUMIN 3.9 11/14/2018 0831   AST 16 11/14/2018 0831   ALT 22 11/14/2018 0831   ALKPHOS 96 11/14/2018 0831   BILITOT <0.2 11/14/2018 0831      Component Value Date/Time   TSH 1.710 06/28/2018 1037   TSH 2.930 10/18/2017 1640   TSH 3.010 08/17/2016 1650      I, [name] Dredge, am acting as Location manager for [employer], DO   I have reviewed the above documentation for accuracy and completeness, and I agree with the above. Jearld Lesch, DO

## 2019-06-05 ENCOUNTER — Encounter (INDEPENDENT_AMBULATORY_CARE_PROVIDER_SITE_OTHER): Payer: Self-pay | Admitting: Bariatrics

## 2019-06-05 ENCOUNTER — Other Ambulatory Visit: Payer: Self-pay

## 2019-06-05 ENCOUNTER — Telehealth (INDEPENDENT_AMBULATORY_CARE_PROVIDER_SITE_OTHER): Payer: 59 | Admitting: Bariatrics

## 2019-06-05 DIAGNOSIS — E8881 Metabolic syndrome: Secondary | ICD-10-CM

## 2019-06-05 DIAGNOSIS — Z6839 Body mass index (BMI) 39.0-39.9, adult: Secondary | ICD-10-CM

## 2019-06-05 DIAGNOSIS — E038 Other specified hypothyroidism: Secondary | ICD-10-CM | POA: Diagnosis not present

## 2019-06-06 NOTE — Progress Notes (Signed)
Office: [phone]  /  Fax: [phone] TeleHealth Visit:  Erin Warren has verbally consented to this TeleHealth visit today. The patient is located at home, the provider is located at the [employer] [facility] office. The participants in this visit include the listed provider and patient. The visit was conducted today via Webex.  HPI:   Chief Complaint: OBESITY Erin Warren is here to discuss her progress with her obesity treatment plan. She is keeping a food journal with 1400 calories and 85 grams of protein and is following her eating plan approximately 50% of the time. She states she is doing yoga/tai chi/aerobics 60 minutes 3 times per week. Erin Warren states that she may have gained 1 or 2 lbs but has not weighed herself. Her elbow has been hurting more with exercise. She reports doing well with her protein and water intake. We were unable to weigh the patient today for this TeleHealth visit. She feels as if she has gained weight since her last visit. She has lost 5 lbs since starting treatment with [name].  Hypothyroidism Erin Warren has a diagnosis of hypothyroidism, which is well controlled.She denies hot or cold intolerance, palpitations, or fatigue. No change in temperature.  Insulin Resistance Erin Warren has a diagnosis of insulin resistance based on her elevated fasting insulin level >5. Although Erin Warren's blood glucose readings are still under good control, insulin resistance puts her at greater risk of metabolic syndrome and diabetes. She is on no medications currently and continues to work on diet and exercise to decrease risk of diabetes.  ASSESSMENT AND PLAN:  Insulin resistance  Other specified hypothyroidism  Class 2 severe obesity with serious comorbidity and body mass index (BMI) of 39.0 to 39.9 in adult, unspecified obesity type [facility])  PLAN:  Hypothyroidism Erin Warren was informed of the importance of good thyroid control to help with weight loss efforts. She was also informed that  supertheraputic thyroid levels are dangerous and will not improve weight loss results. Erin Warren will continue her medication and follow-up as directed.  Insulin Resistance Erin Warren will continue to work on weight loss, exercise, and decreasing simple carbohydrates in her diet to help decrease the risk of diabetes. We dicussed metformin including benefits and risks. She was informed that eating too many simple carbohydrates or too many calories at one sitting increases the likelihood of GI side effects. Erin Warren was instructed to decrease carbohydrates, increase protein, and increase exercise. She will follow-up with [name] as directed to monitor her progress.  Obesity Erin Warren is currently in the action stage of change. As such, her goal is to continue with weight loss efforts. She has agreed to keep a food journal with 1400 calories and 85 grams of protein. Erin Warren will work on meal planning, intentional eating, and journaling. She will use Ace bandages to her elbow with exercising. Erin Warren has been instructed to continue her current exercise regimen for weight loss and overall health benefits. We discussed the following Behavioral Modification Strategies today: increasing lean protein intake, decreasing simple carbohydrates, increasing vegetables, increase H20 intake, decrease eating out, no skipping meals, work on meal planning and easy cooking plans, keeping healthy foods in the home, and planning for success.  Erin Warren has agreed to follow-up with our clinic in 2 weeks. She was informed of the importance of frequent follow-up visits to maximize her success with intensive lifestyle modifications for her multiple health conditions.  ALLERGIES: Allergies  Allergen Reactions  . Food Hives, Nausea Only and Swelling    WHEAT  . Garlic   .  Onion   . Topamax [Topiramate] Other (See Comments)    Flushing, paresthesias (tingling, burning).      MEDICATIONS: Current Outpatient Medications on File Prior to Visit   Medication Sig Dispense Refill  . albuterol (PROVENTIL HFA;VENTOLIN HFA) 108 (90 BASE) MCG/ACT inhaler Inhale 2 puffs into the lungs every 6 (six) hours as needed. 1 Inhaler 3  . Cholecalciferol (VITAMIN D) 2000 UNITS tablet Take 2,000 Units by mouth daily.    [name] EPIPEN 2-PAK 0.3 MG/0.3ML SOAJ injection as needed.    . fluticasone (FLONASE) 50 MCG/ACT nasal spray SPRAY 2 SPRAYS INTO EACH NOSTRIL EVERY DAY 16 g 2  . levothyroxine (SYNTHROID, LEVOTHROID) 25 MCG tablet Take 1 tablet (25 mcg total) by mouth daily. 90 tablet 3  . loratadine (CLARITIN) 10 MG tablet Take 10 mg by mouth daily.    . metFORMIN (GLUCOPHAGE) 500 MG tablet TAKE 1 TABLET BY MOUTH TWICE A DAY 60 tablet 0  . propranolol ER (INDERAL LA) 80 MG 24 hr capsule Take 1 capsule (80 mg total) by mouth daily. 90 capsule 4  . QVAR REDIHALER 80 MCG/ACT inhaler TAKE 1 PUFF BY MOUTH TWICE A DAY  5  . ranitidine (ZANTAC) 150 MG tablet Take 1 tablet (150 mg total) by mouth 2 (two) times daily. No more refills without office visit 180 tablet 0  . rizatriptan (MAXALT-MLT) 10 MG disintegrating tablet Take 1 tablet (10 mg total) by mouth as needed for migraine. May repeat in 2 hours if needed 9 tablet 12   No current facility-administered medications on file prior to visit.     PAST MEDICAL HISTORY: Past Medical History:  Diagnosis Date  . AC (acromioclavicular) joint bone spurs   . Allergy   . Asthma   . Bunion   . Edema, lower extremity   . GERD (gastroesophageal reflux disease)   . Heel spur    Both heels, one has fractured off  . Hypertension   . Hypothyroidism   . Migraines   . Multiple food allergies   . Polycystic ovarian syndrome   . Seasonal allergies   . Thyroid disease   . Ulnar nerve abnormality     PAST SURGICAL HISTORY: Past Surgical History:  Procedure Laterality Date  . OOPHORECTOMY Left   . PLANTAR FASCIECTOMY Bilateral 08/2014  . TERATOMA EXCISION Left   . TUBAL LIGATION  2008    SOCIAL HISTORY: Social  History   Tobacco Use  . Smoking status: Never Smoker  . Smokeless tobacco: Never Used  Substance Use Topics  . Alcohol use: Yes    Alcohol/week: 1.0 standard drinks    Types: 1 Glasses of wine per week    Comment: 1-4/month  . Drug use: No    FAMILY HISTORY: Family History  Problem Relation Age of Onset  . Thyroid disease Mother   . Allergies Mother   . Hypertension Father   . Heart disease Father   . Cancer Father        skin  . Sleep apnea Father   . Obesity Father   . Alcohol abuse Maternal Grandfather   . Hypertension Paternal Grandmother   . Hypertension Paternal Grandfather   . Diabetes Paternal Grandfather   . Hypertension Brother   . Heart Problems Brother    ROS: Review of Systems  Constitutional: Negative for malaise/fatigue.       Negative for temperature changes.  Cardiovascular: Negative for palpitations.  Endo/Heme/Allergies:       Negative for hot/cold intolerance.  PHYSICAL EXAM: Pt in no acute distress  RECENT LABS AND TESTS: BMET    Component Value Date/Time   NA 138 11/14/2018 0831   K 4.7 11/14/2018 0831   CL 103 11/14/2018 0831   CO2 19 (L) 11/14/2018 0831   GLUCOSE 86 11/14/2018 0831   GLUCOSE 80 02/10/2016 1735   BUN 15 11/14/2018 0831   CREATININE 0.85 11/14/2018 0831   CREATININE 0.67 02/10/2016 1735   CALCIUM 9.3 11/14/2018 0831   GFRNONAA 84 11/14/2018 0831   GFRAA 96 11/14/2018 0831   Lab Results  Component Value Date   HGBA1C 5.1 11/14/2018   HGBA1C 5.2 06/28/2018   HGBA1C 4.7 09/27/2013   Lab Results  Component Value Date   INSULIN 20.4 11/14/2018   INSULIN 14.2 06/28/2018   CBC    Component Value Date/Time   WBC 11.6 (H) 06/28/2018 1037   WBC 11.6 (H) 02/03/2018 1620   RBC 4.85 06/28/2018 1037   RBC 4.72 02/03/2018 1620   HGB 14.8 06/28/2018 1037   HCT 43.8 06/28/2018 1037   PLT 375.0 02/03/2018 1620   PLT 435 (H) 10/18/2017 1640   MCV 90 06/28/2018 1037   MCH 30.5 06/28/2018 1037   MCH 31.3 02/04/2015  1109   MCHC 33.8 06/28/2018 1037   MCHC 34.1 02/03/2018 1620   RDW 13.2 06/28/2018 1037   LYMPHSABS 1.9 06/28/2018 1037   MONOABS 0.7 02/03/2018 1620   EOSABS 0.3 06/28/2018 1037   BASOSABS 0.1 06/28/2018 1037   Iron/TIBC/Ferritin/ %Sat No results found for: IRON, TIBC, FERRITIN, IRONPCTSAT Lipid Panel     Component Value Date/Time   CHOL 140 11/14/2018 0831   TRIG 201 (H) 11/14/2018 0831   HDL 45 11/14/2018 0831   CHOLHDL 3.1 10/18/2017 1640   CHOLHDL 2.7 02/04/2015 1109   VLDL 23 02/04/2015 1109   LDLCALC 55 11/14/2018 0831   Hepatic Function Panel     Component Value Date/Time   PROT 7.1 11/14/2018 0831   ALBUMIN 3.9 11/14/2018 0831   AST 16 11/14/2018 0831   ALT 22 11/14/2018 0831   ALKPHOS 96 11/14/2018 0831   BILITOT <0.2 11/14/2018 0831      Component Value Date/Time   TSH 1.710 06/28/2018 1037   TSH 2.930 10/18/2017 1640   TSH 3.010 08/17/2016 1650   Results for NADIYAH, ZEIS (MRN [id number]) as of 06/06/2019 07:06  Ref. Range 06/28/2018 10:37  Vitamin D, 25-Hydroxy Latest Ref Range: 30.0 - 100.0 ng/mL 47.5   I, Michaelene Song, am acting as Location manager for [employer], DO  I have reviewed the above documentation for accuracy and completeness, and I agree with the above. -Jearld Lesch, DO

## 2019-06-19 ENCOUNTER — Encounter (INDEPENDENT_AMBULATORY_CARE_PROVIDER_SITE_OTHER): Payer: Self-pay | Admitting: Bariatrics

## 2019-06-19 ENCOUNTER — Other Ambulatory Visit: Payer: Self-pay

## 2019-06-19 ENCOUNTER — Telehealth (INDEPENDENT_AMBULATORY_CARE_PROVIDER_SITE_OTHER): Payer: 59 | Admitting: Bariatrics

## 2019-06-19 DIAGNOSIS — Z6839 Body mass index (BMI) 39.0-39.9, adult: Secondary | ICD-10-CM

## 2019-06-19 DIAGNOSIS — E8881 Metabolic syndrome: Secondary | ICD-10-CM | POA: Diagnosis not present

## 2019-06-20 NOTE — Progress Notes (Signed)
Office: [phone]  /  Fax: [phone] TeleHealth Visit:  Erin Warren has verbally consented to this TeleHealth visit today. The patient is located at home, the provider is located at the [employer] [facility] office. The participants in this visit include the listed provider and patient. The visit was conducted today via webex.  HPI:   Chief Complaint: OBESITY Erin Warren is here to discuss her progress with her obesity treatment plan. She is on the keep a food journal with 1400 calories and 85 grams of protein daily and is following her eating plan approximately 60-70 % of the time. She states she is doing tai chi, aerobics, and yoga for 50-60 minutes 5 times per week. Erin Warren states that her weight remains the same. She still struggles with carbohydrates. She is doing well with her water intake.  We were unable to weigh the patient today for this TeleHealth visit. She feels as if she has maintained her weight since her last visit. She has lost 5 lbs since starting treatment with [name].  Insulin Resistance Erin Warren has a diagnosis of insulin resistance based on her elevated fasting insulin level >5. Although Erin Warren's blood glucose readings are still under good control, insulin resistance puts her at greater risk of metabolic syndrome and diabetes. She is taking metformin currently and denies polyphagia. She continues to work on diet and exercise to decrease risk of diabetes.  ASSESSMENT AND PLAN:  Insulin resistance  Class 2 severe obesity with serious comorbidity and body mass index (BMI) of 39.0 to 39.9 in adult, unspecified obesity type (Erin Warren)  PLAN:  Insulin Resistance Erin Warren will continue to work on weight loss, exercise, increase protein, healthy fats, and decreasing simple carbohydrates in her diet to help decrease the risk of diabetes. We dicussed metformin including benefits and risks. She was informed that eating too many simple carbohydrates or too many calories at one sitting  increases the likelihood of GI side effects. Erin Warren agrees to continue taking metformin, and she agrees to follow up with our clinic in 2 weeks as directed to monitor her progress.  Obesity Erin Warren is currently in the action stage of change. As such, her goal is to continue with weight loss efforts She has agreed to follow the Category 2 plan Erin Warren has been instructed to work up to a goal of 150 minutes of combined cardio and strengthening exercise per week or continue current exercise and increase rotation for weight loss and overall health benefits. We discussed the following Behavioral Modification Strategies today: increasing lean protein intake, decreasing simple carbohydrates, increasing vegetables, decrease eating out, increase H20 intake, no skipping meals, work on meal planning and easy cooking plans, and keeping healthy foods in the home Erin Warren is to decrease her calories and carbohydrates. She is to adjust her exercise routine (to include different muscle groups). She will work more with [employer].  Erin Warren has agreed to follow up with our clinic in 2 weeks. She was informed of the importance of frequent follow up visits to maximize her success with intensive lifestyle modifications for her multiple health conditions.  ALLERGIES: Allergies  Allergen Reactions  . Food Hives, Nausea Only and Swelling    WHEAT  . Garlic   . Onion   . Topamax [Topiramate] Other (See Comments)    Flushing, paresthesias (tingling, burning).      MEDICATIONS: Current Outpatient Medications on File Prior to Visit  Medication Sig Dispense Refill  . albuterol (PROVENTIL HFA;VENTOLIN HFA) 108 (90 BASE) MCG/ACT inhaler Inhale 2  puffs into the lungs every 6 (six) hours as needed. 1 Inhaler 3  . Cholecalciferol (VITAMIN D) 2000 UNITS tablet Take 2,000 Units by mouth daily.    [name] EPIPEN 2-PAK 0.3 MG/0.3ML SOAJ injection as needed.    . fluticasone (FLONASE) 50 MCG/ACT nasal spray SPRAY 2 SPRAYS INTO EACH  NOSTRIL EVERY DAY 16 g 2  . levothyroxine (SYNTHROID, LEVOTHROID) 25 MCG tablet Take 1 tablet (25 mcg total) by mouth daily. 90 tablet 3  . loratadine (CLARITIN) 10 MG tablet Take 10 mg by mouth daily.    . metFORMIN (GLUCOPHAGE) 500 MG tablet TAKE 1 TABLET BY MOUTH TWICE A DAY 60 tablet 0  . propranolol ER (INDERAL LA) 80 MG 24 hr capsule Take 1 capsule (80 mg total) by mouth daily. 90 capsule 4  . QVAR REDIHALER 80 MCG/ACT inhaler TAKE 1 PUFF BY MOUTH TWICE A DAY  5  . ranitidine (ZANTAC) 150 MG tablet Take 1 tablet (150 mg total) by mouth 2 (two) times daily. No more refills without office visit 180 tablet 0  . rizatriptan (MAXALT-MLT) 10 MG disintegrating tablet Take 1 tablet (10 mg total) by mouth as needed for migraine. May repeat in 2 hours if needed 9 tablet 12   No current facility-administered medications on file prior to visit.     PAST MEDICAL HISTORY: Past Medical History:  Diagnosis Date  . AC (acromioclavicular) joint bone spurs   . Allergy   . Asthma   . Bunion   . Edema, lower extremity   . GERD (gastroesophageal reflux disease)   . Heel spur    Both heels, one has fractured off  . Hypertension   . Hypothyroidism   . Migraines   . Multiple food allergies   . Polycystic ovarian syndrome   . Seasonal allergies   . Thyroid disease   . Ulnar nerve abnormality     PAST SURGICAL HISTORY: Past Surgical History:  Procedure Laterality Date  . OOPHORECTOMY Left   . PLANTAR FASCIECTOMY Bilateral 08/2014  . TERATOMA EXCISION Left   . TUBAL LIGATION  2008    SOCIAL HISTORY: Social History   Tobacco Use  . Smoking status: Never Smoker  . Smokeless tobacco: Never Used  Substance Use Topics  . Alcohol use: Yes    Alcohol/week: 1.0 standard drinks    Types: 1 Glasses of wine per week    Comment: 1-4/month  . Drug use: No    FAMILY HISTORY: Family History  Problem Relation Age of Onset  . Thyroid disease Mother   . Allergies Mother   . Hypertension Father    . Heart disease Father   . Cancer Father        skin  . Sleep apnea Father   . Obesity Father   . Alcohol abuse Maternal Grandfather   . Hypertension Paternal Grandmother   . Hypertension Paternal Grandfather   . Diabetes Paternal Grandfather   . Hypertension Brother   . Heart Problems Brother     ROS: Review of Systems  Constitutional: Negative for weight loss.  Endo/Heme/Allergies:       Negative polyphagia    PHYSICAL EXAM: Pt in no acute distress  RECENT LABS AND TESTS: BMET    Component Value Date/Time   NA 138 11/14/2018 0831   K 4.7 11/14/2018 0831   CL 103 11/14/2018 0831   CO2 19 (L) 11/14/2018 0831   GLUCOSE 86 11/14/2018 0831   GLUCOSE 80 02/10/2016 1735   BUN 15 11/14/2018 0831  CREATININE 0.85 11/14/2018 0831   CREATININE 0.67 02/10/2016 1735   CALCIUM 9.3 11/14/2018 0831   GFRNONAA 84 11/14/2018 0831   GFRAA 96 11/14/2018 0831   Lab Results  Component Value Date   HGBA1C 5.1 11/14/2018   HGBA1C 5.2 06/28/2018   HGBA1C 4.7 09/27/2013   Lab Results  Component Value Date   INSULIN 20.4 11/14/2018   INSULIN 14.2 06/28/2018   CBC    Component Value Date/Time   WBC 11.6 (H) 06/28/2018 1037   WBC 11.6 (H) 02/03/2018 1620   RBC 4.85 06/28/2018 1037   RBC 4.72 02/03/2018 1620   HGB 14.8 06/28/2018 1037   HCT 43.8 06/28/2018 1037   PLT 375.0 02/03/2018 1620   PLT 435 (H) 10/18/2017 1640   MCV 90 06/28/2018 1037   MCH 30.5 06/28/2018 1037   MCH 31.3 02/04/2015 1109   MCHC 33.8 06/28/2018 1037   MCHC 34.1 02/03/2018 1620   RDW 13.2 06/28/2018 1037   LYMPHSABS 1.9 06/28/2018 1037   MONOABS 0.7 02/03/2018 1620   EOSABS 0.3 06/28/2018 1037   BASOSABS 0.1 06/28/2018 1037   Iron/TIBC/Ferritin/ %Sat No results found for: IRON, TIBC, FERRITIN, IRONPCTSAT Lipid Panel     Component Value Date/Time   CHOL 140 11/14/2018 0831   TRIG 201 (H) 11/14/2018 0831   HDL 45 11/14/2018 0831   CHOLHDL 3.1 10/18/2017 1640   CHOLHDL 2.7 02/04/2015 1109    VLDL 23 02/04/2015 1109   LDLCALC 55 11/14/2018 0831   Hepatic Function Panel     Component Value Date/Time   PROT 7.1 11/14/2018 0831   ALBUMIN 3.9 11/14/2018 0831   AST 16 11/14/2018 0831   ALT 22 11/14/2018 0831   ALKPHOS 96 11/14/2018 0831   BILITOT <0.2 11/14/2018 0831      Component Value Date/Time   TSH 1.710 06/28/2018 1037   TSH 2.930 10/18/2017 1640   TSH 3.010 08/17/2016 1650      I, [name] Dredge, am acting as Location manager for [employer], DO  I have reviewed the above documentation for accuracy and completeness, and I agree with the above. Jearld Lesch, DO

## 2019-07-09 ENCOUNTER — Encounter (INDEPENDENT_AMBULATORY_CARE_PROVIDER_SITE_OTHER): Payer: Self-pay | Admitting: Bariatrics

## 2019-07-09 ENCOUNTER — Telehealth (INDEPENDENT_AMBULATORY_CARE_PROVIDER_SITE_OTHER): Payer: 59 | Admitting: Bariatrics

## 2019-07-09 ENCOUNTER — Other Ambulatory Visit: Payer: Self-pay

## 2019-07-09 DIAGNOSIS — Z6839 Body mass index (BMI) 39.0-39.9, adult: Secondary | ICD-10-CM

## 2019-07-09 DIAGNOSIS — G43109 Migraine with aura, not intractable, without status migrainosus: Secondary | ICD-10-CM

## 2019-07-09 DIAGNOSIS — K219 Gastro-esophageal reflux disease without esophagitis: Secondary | ICD-10-CM

## 2019-07-10 NOTE — Progress Notes (Addendum)
Office: [phone]  /  Fax: [phone] TeleHealth Visit:  Erin Warren has verbally consented to this TeleHealth visit today. The patient is located at home, the provider is located at the [facility] office. The participants in this visit include the listed provider and patient. Shaunie was unable to use realtime audiovisual technology today and the telehealth visit was conducted via telephone for 16 minutes.   HPI:   Chief Complaint: OBESITY Erin Warren is here to discuss her progress with her obesity treatment plan. She is on the Category 2 plan and is following her eating plan approximately 60 % of the time. She states she [place] around [place] zoo for 5 hours 1 time per week. [name] states that her weight remains the same. She went to the zoo and enjoyed her visit. She is having some stress.  We were unable to weigh the patient today for this TeleHealth visit. She feels as if she has maintained her weight since her last visit. She has lost 5 lbs since starting treatment with [name].  Migraine with [name] note her migraines are improving.   GERD [name] notes her symptoms are stable. She is on antacids and she notes food allergies.  ASSESSMENT AND PLAN:  Migraine with aura and without status migrainosus, not intractable  Gastroesophageal reflux disease without esophagitis  Class 2 severe obesity with serious comorbidity and body mass index (BMI) of 39.0 to 39.9 in adult, unspecified obesity type ([place])  PLAN:  Migraine with Erin Warren will follow up with her primary care physician, and she agrees to follow up with our clinic in 2 to 3 weeks.  GERD [name] will continue her medications, and she agrees to follow up with our clinic in 2 to 3 weeks.  Obesity Erin Warren is currently in the action stage of change. As such, her goal is to continue with weight loss efforts She has agreed to follow the Category 2 plan [name] has been instructed to work up to a goal of 150 minutes of  combined cardio and strengthening exercise per week or increase walking frequency and add home exercises for weight loss and overall health benefits. We discussed the following Behavioral Modification Strategies today: increasing lean protein intake, decreasing simple carbohydrates, increasing vegetables, decrease eating out, increase H20 intake, no skipping meals, work on meal planning and easy cooking plans, and keeping healthy foods in the home Erin Warren will be more strict with adherence to the meal plan. We will do labs at her next in office visit.  Erin Warren has agreed to follow up with our clinic in 2 to 3 weeks. She was informed of the importance of frequent follow up visits to maximize her success with intensive lifestyle modifications for her multiple health conditions.  ALLERGIES: Allergies  Allergen Reactions  . Food Hives, Nausea Only and Swelling    WHEAT  . Garlic   . Onion   . Topamax [Topiramate] Other (See Comments)    Flushing, paresthesias (tingling, burning).      MEDICATIONS: Current Outpatient Medications on File Prior to Visit  Medication Sig Dispense Refill  . albuterol (PROVENTIL HFA;VENTOLIN HFA) 108 (90 BASE) MCG/ACT inhaler Inhale 2 puffs into the lungs every 6 (six) hours as needed. 1 Inhaler 3  . Cholecalciferol (VITAMIN D) 2000 UNITS tablet Take 2,000 Units by mouth daily.    [name] EPIPEN 2-PAK 0.3 MG/0.3ML SOAJ injection as needed.    . fluticasone (FLONASE) 50 MCG/ACT nasal spray SPRAY 2 SPRAYS INTO EACH NOSTRIL EVERY DAY  16 g 2  . levothyroxine (SYNTHROID, LEVOTHROID) 25 MCG tablet Take 1 tablet (25 mcg total) by mouth daily. 90 tablet 3  . loratadine (CLARITIN) 10 MG tablet Take 10 mg by mouth daily.    . metFORMIN (GLUCOPHAGE) 500 MG tablet TAKE 1 TABLET BY MOUTH TWICE A DAY 60 tablet 0  . propranolol ER (INDERAL LA) 80 MG 24 hr capsule Take 1 capsule (80 mg total) by mouth daily. 90 capsule 4  . QVAR REDIHALER 80 MCG/ACT inhaler TAKE 1 PUFF BY MOUTH TWICE A DAY   5  . ranitidine (ZANTAC) 150 MG tablet Take 1 tablet (150 mg total) by mouth 2 (two) times daily. No more refills without office visit 180 tablet 0  . rizatriptan (MAXALT-MLT) 10 MG disintegrating tablet Take 1 tablet (10 mg total) by mouth as needed for migraine. May repeat in 2 hours if needed 9 tablet 12   No current facility-administered medications on file prior to visit.     PAST MEDICAL HISTORY: Past Medical History:  Diagnosis Date  . AC (acromioclavicular) joint bone spurs   . Allergy   . Asthma   . Bunion   . Edema, lower extremity   . GERD (gastroesophageal reflux disease)   . Heel spur    Both heels, one has fractured off  . Hypertension   . Hypothyroidism   . Migraines   . Multiple food allergies   . Polycystic ovarian syndrome   . Seasonal allergies   . Thyroid disease   . Ulnar nerve abnormality     PAST SURGICAL HISTORY: Past Surgical History:  Procedure Laterality Date  . OOPHORECTOMY Left   . PLANTAR FASCIECTOMY Bilateral 08/2014  . TERATOMA EXCISION Left   . TUBAL LIGATION  2008    SOCIAL HISTORY: Social History   Tobacco Use  . Smoking status: Never Smoker  . Smokeless tobacco: Never Used  Substance Use Topics  . Alcohol use: Yes    Alcohol/week: 1.0 standard drinks    Types: 1 Glasses of wine per week    Comment: 1-4/month  . Drug use: No    FAMILY HISTORY: Family History  Problem Relation Age of Onset  . Thyroid disease Mother   . Allergies Mother   . Hypertension Father   . Heart disease Father   . Cancer Father        skin  . Sleep apnea Father   . Obesity Father   . Alcohol abuse Maternal Grandfather   . Hypertension Paternal Grandmother   . Hypertension Paternal Grandfather   . Diabetes Paternal Grandfather   . Hypertension Brother   . Heart Problems Brother     ROS: Review of Systems  Constitutional: Negative for weight loss.  Neurological: Positive for headaches.    PHYSICAL EXAM: Pt in no acute distress   RECENT LABS AND TESTS: BMET    Component Value Date/Time   NA 138 11/14/2018 0831   K 4.7 11/14/2018 0831   CL 103 11/14/2018 0831   CO2 19 (L) 11/14/2018 0831   GLUCOSE 86 11/14/2018 0831   GLUCOSE 80 02/10/2016 1735   BUN 15 11/14/2018 0831   CREATININE 0.85 11/14/2018 0831   CREATININE 0.67 02/10/2016 1735   CALCIUM 9.3 11/14/2018 0831   GFRNONAA 84 11/14/2018 0831   GFRAA 96 11/14/2018 0831   Lab Results  Component Value Date   HGBA1C 5.1 11/14/2018   HGBA1C 5.2 06/28/2018   HGBA1C 4.7 09/27/2013   Lab Results  Component Value Date  INSULIN 20.4 11/14/2018   INSULIN 14.2 06/28/2018   CBC    Component Value Date/Time   WBC 11.6 (H) 06/28/2018 1037   WBC 11.6 (H) 02/03/2018 1620   RBC 4.85 06/28/2018 1037   RBC 4.72 02/03/2018 1620   HGB 14.8 06/28/2018 1037   HCT 43.8 06/28/2018 1037   PLT 375.0 02/03/2018 1620   PLT 435 (H) 10/18/2017 1640   MCV 90 06/28/2018 1037   MCH 30.5 06/28/2018 1037   MCH 31.3 02/04/2015 1109   MCHC 33.8 06/28/2018 1037   MCHC 34.1 02/03/2018 1620   RDW 13.2 06/28/2018 1037   LYMPHSABS 1.9 06/28/2018 1037   MONOABS 0.7 02/03/2018 1620   EOSABS 0.3 06/28/2018 1037   BASOSABS 0.1 06/28/2018 1037   Iron/TIBC/Ferritin/ %Sat No results found for: IRON, TIBC, FERRITIN, IRONPCTSAT Lipid Panel     Component Value Date/Time   CHOL 140 11/14/2018 0831   TRIG 201 (H) 11/14/2018 0831   HDL 45 11/14/2018 0831   CHOLHDL 3.1 10/18/2017 1640   CHOLHDL 2.7 02/04/2015 1109   VLDL 23 02/04/2015 1109   LDLCALC 55 11/14/2018 0831   Hepatic Function Panel     Component Value Date/Time   PROT 7.1 11/14/2018 0831   ALBUMIN 3.9 11/14/2018 0831   AST 16 11/14/2018 0831   ALT 22 11/14/2018 0831   ALKPHOS 96 11/14/2018 0831   BILITOT <0.2 11/14/2018 0831      Component Value Date/Time   TSH 1.710 06/28/2018 1037   TSH 2.930 10/18/2017 1640   TSH 3.010 08/17/2016 1650      I, Erin Warren Dredge, am acting as Location manager for [employer], DO  I have reviewed the above documentation for accuracy and completeness, and I agree with the above. Jearld Lesch, DO

## 2019-08-07 ENCOUNTER — Telehealth (INDEPENDENT_AMBULATORY_CARE_PROVIDER_SITE_OTHER): Payer: 59 | Admitting: Bariatrics

## 2019-08-07 ENCOUNTER — Other Ambulatory Visit: Payer: Self-pay

## 2019-08-07 ENCOUNTER — Encounter (INDEPENDENT_AMBULATORY_CARE_PROVIDER_SITE_OTHER): Payer: Self-pay | Admitting: Bariatrics

## 2019-08-07 DIAGNOSIS — G43109 Migraine with aura, not intractable, without status migrainosus: Secondary | ICD-10-CM | POA: Diagnosis not present

## 2019-08-07 DIAGNOSIS — Z6839 Body mass index (BMI) 39.0-39.9, adult: Secondary | ICD-10-CM

## 2019-08-07 DIAGNOSIS — E8881 Metabolic syndrome: Secondary | ICD-10-CM | POA: Diagnosis not present

## 2019-08-08 NOTE — Progress Notes (Signed)
Office: [phone]  /  Fax: [phone] TeleHealth Visit:  Erin Warren has verbally consented to this TeleHealth visit today. The patient is located at home, the provider is located at the [facility] office. The participants in this visit include the listed provider and patient. The visit was conducted today via webex.  HPI:   Chief Complaint: OBESITY Erin Warren is here to discuss her progress with her obesity treatment plan. She is on the Category 2 plan and is following her eating plan approximately 50 % of the time. She states she is stretching for 20 minutes 5 times per week. Magline states that she has remained the same. She has too many carbohydrates for the holiday. She is doing well drinking her water.  We were unable to weigh the patient today for this TeleHealth visit. She feels as if she has maintained her weight since her last visit. She has lost 5 lbs since starting treatment with [name].  Migraines Erin Warren complains of migraines, and notes a decrease in frequency. She is taking Maxalt and propranolol ER.  Insulin Resistance Erin Warren has a diagnosis of insulin resistance based on her elevated fasting insulin level >5. Although Erin Warren's blood glucose readings are still under good control, insulin resistance puts her at greater risk of metabolic syndrome and diabetes. She is taking metformin currently and denies increased appetite. She continues to work on diet and exercise to decrease risk of diabetes.  ASSESSMENT AND PLAN:  Migraine with aura and without status migrainosus, not intractable  Insulin resistance  Class 2 severe obesity with serious comorbidity and body mass index (BMI) of 39.0 to 39.9 in adult, unspecified obesity type ([facility])  PLAN:  Migraines We discussed migraines, and Erin Warren is aware that losing weight may help with her migraines. She will increase water frequency.  Insulin Resistance Erin Warren will continue to work on weight loss, exercise, increasing  protein and healthy fats, and decreasing simple carbohydrates in her diet to help decrease the risk of diabetes. We dicussed metformin including benefits and risks. She was informed that eating too many simple carbohydrates or too many calories at one sitting increases the likelihood of GI side effects. Erin Warren agrees to continue taking metformin, and she agrees to follow up with our clinic in 2 to 3 weeks as directed to monitor her progress.  Obesity Erin Warren is currently in the action stage of change. As such, her goal is to continue with weight loss efforts She has agreed to follow the Category 2 plan Erin Warren has been instructed to work up to a goal of 150 minutes of combined cardio and strengthening exercise per week or continue stretching and increase her cardio for weight loss and overall health benefits. We discussed the following Behavioral Modification Strategies today: increasing lean protein intake, decreasing simple carbohydrates, increasing vegetables, increase H20 intake, no skipping meals, decrease eating out, work on meal planning and easy cooking plans, and keeping healthy foods in the home Erin Warren will work on portion control, portioning her snacks, and will get back to journaling.  Erin Warren has agreed to follow up with our clinic in 2 to 3 weeks. She was informed of the importance of frequent follow up visits to maximize her success with intensive lifestyle modifications for her multiple health conditions.  ALLERGIES: Allergies  Allergen Reactions  . Food Hives, Nausea Only and Swelling    WHEAT  . Garlic   . Onion   . Topamax [Topiramate] Other (See Comments)    Flushing, paresthesias (tingling, burning).  MEDICATIONS: Current Outpatient Medications on File Prior to Visit  Medication Sig Dispense Refill  . albuterol (PROVENTIL HFA;VENTOLIN HFA) 108 (90 BASE) MCG/ACT inhaler Inhale 2 puffs into the lungs every 6 (six) hours as needed. 1 Inhaler 3  . Cholecalciferol (VITAMIN D)  2000 UNITS tablet Take 2,000 Units by mouth daily.    [name] EPIPEN 2-PAK 0.3 MG/0.3ML SOAJ injection as needed.    . fluticasone (FLONASE) 50 MCG/ACT nasal spray SPRAY 2 SPRAYS INTO EACH NOSTRIL EVERY DAY 16 g 2  . levothyroxine (SYNTHROID, LEVOTHROID) 25 MCG tablet Take 1 tablet (25 mcg total) by mouth daily. 90 tablet 3  . loratadine (CLARITIN) 10 MG tablet Take 10 mg by mouth daily.    . metFORMIN (GLUCOPHAGE) 500 MG tablet TAKE 1 TABLET BY MOUTH TWICE A DAY 60 tablet 0  . propranolol ER (INDERAL LA) 80 MG 24 hr capsule Take 1 capsule (80 mg total) by mouth daily. 90 capsule 4  . QVAR REDIHALER 80 MCG/ACT inhaler TAKE 1 PUFF BY MOUTH TWICE A DAY  5  . ranitidine (ZANTAC) 150 MG tablet Take 1 tablet (150 mg total) by mouth 2 (two) times daily. No more refills without office visit 180 tablet 0  . rizatriptan (MAXALT-MLT) 10 MG disintegrating tablet Take 1 tablet (10 mg total) by mouth as needed for migraine. May repeat in 2 hours if needed 9 tablet 12   No current facility-administered medications on file prior to visit.     PAST MEDICAL HISTORY: Past Medical History:  Diagnosis Date  . AC (acromioclavicular) joint bone spurs   . Allergy   . Asthma   . Bunion   . Edema, lower extremity   . GERD (gastroesophageal reflux disease)   . Heel spur    Both heels, one has fractured off  . Hypertension   . Hypothyroidism   . Migraines   . Multiple food allergies   . Polycystic ovarian syndrome   . Seasonal allergies   . Thyroid disease   . Ulnar nerve abnormality     PAST SURGICAL HISTORY: Past Surgical History:  Procedure Laterality Date  . OOPHORECTOMY Left   . PLANTAR FASCIECTOMY Bilateral 08/2014  . TERATOMA EXCISION Left   . TUBAL LIGATION  2008    SOCIAL HISTORY: Social History   Tobacco Use  . Smoking status: Never Smoker  . Smokeless tobacco: Never Used  Substance Use Topics  . Alcohol use: Yes    Alcohol/week: 1.0 standard drinks    Types: 1 Glasses of wine per week     Comment: 1-4/month  . Drug use: No    FAMILY HISTORY: Family History  Problem Relation Age of Onset  . Thyroid disease Mother   . Allergies Mother   . Hypertension Father   . Heart disease Father   . Cancer Father        skin  . Sleep apnea Father   . Obesity Father   . Alcohol abuse Maternal Grandfather   . Hypertension Paternal Grandmother   . Hypertension Paternal Grandfather   . Diabetes Paternal Grandfather   . Hypertension Brother   . Heart Problems Brother     ROS: Review of Systems  Constitutional: Negative for weight loss.  Neurological: Positive for headaches (Migraines).    PHYSICAL EXAM: Pt in no acute distress  RECENT LABS AND TESTS: BMET    Component Value Date/Time   NA 138 11/14/2018 0831   K 4.7 11/14/2018 0831   CL 103 11/14/2018 0831  CO2 19 (L) 11/14/2018 0831   GLUCOSE 86 11/14/2018 0831   GLUCOSE 80 02/10/2016 1735   BUN 15 11/14/2018 0831   CREATININE 0.85 11/14/2018 0831   CREATININE 0.67 02/10/2016 1735   CALCIUM 9.3 11/14/2018 0831   GFRNONAA 84 11/14/2018 0831   GFRAA 96 11/14/2018 0831   Lab Results  Component Value Date   HGBA1C 5.1 11/14/2018   HGBA1C 5.2 06/28/2018   HGBA1C 4.7 09/27/2013   Lab Results  Component Value Date   INSULIN 20.4 11/14/2018   INSULIN 14.2 06/28/2018   CBC    Component Value Date/Time   WBC 11.6 (H) 06/28/2018 1037   WBC 11.6 (H) 02/03/2018 1620   RBC 4.85 06/28/2018 1037   RBC 4.72 02/03/2018 1620   HGB 14.8 06/28/2018 1037   HCT 43.8 06/28/2018 1037   PLT 375.0 02/03/2018 1620   PLT 435 (H) 10/18/2017 1640   MCV 90 06/28/2018 1037   MCH 30.5 06/28/2018 1037   MCH 31.3 02/04/2015 1109   MCHC 33.8 06/28/2018 1037   MCHC 34.1 02/03/2018 1620   RDW 13.2 06/28/2018 1037   LYMPHSABS 1.9 06/28/2018 1037   MONOABS 0.7 02/03/2018 1620   EOSABS 0.3 06/28/2018 1037   BASOSABS 0.1 06/28/2018 1037   Iron/TIBC/Ferritin/ %Sat No results found for: IRON, TIBC, FERRITIN, IRONPCTSAT Lipid  Panel     Component Value Date/Time   CHOL 140 11/14/2018 0831   TRIG 201 (H) 11/14/2018 0831   HDL 45 11/14/2018 0831   CHOLHDL 3.1 10/18/2017 1640   CHOLHDL 2.7 02/04/2015 1109   VLDL 23 02/04/2015 1109   LDLCALC 55 11/14/2018 0831   Hepatic Function Panel     Component Value Date/Time   PROT 7.1 11/14/2018 0831   ALBUMIN 3.9 11/14/2018 0831   AST 16 11/14/2018 0831   ALT 22 11/14/2018 0831   ALKPHOS 96 11/14/2018 0831   BILITOT <0.2 11/14/2018 0831      Component Value Date/Time   TSH 1.710 06/28/2018 1037   TSH 2.930 10/18/2017 1640   TSH 3.010 08/17/2016 1650      I, [name] Dredge, am acting as Location manager for [employer], DO   I have reviewed the above documentation for accuracy and completeness, and I agree with the above. Jearld Lesch, DO

## 2019-08-21 ENCOUNTER — Other Ambulatory Visit: Payer: Self-pay

## 2019-08-21 ENCOUNTER — Telehealth (INDEPENDENT_AMBULATORY_CARE_PROVIDER_SITE_OTHER): Payer: 59 | Admitting: Bariatrics

## 2019-08-21 ENCOUNTER — Encounter (INDEPENDENT_AMBULATORY_CARE_PROVIDER_SITE_OTHER): Payer: Self-pay | Admitting: Bariatrics

## 2019-08-21 DIAGNOSIS — E8881 Metabolic syndrome: Secondary | ICD-10-CM | POA: Diagnosis not present

## 2019-08-21 DIAGNOSIS — Z6839 Body mass index (BMI) 39.0-39.9, adult: Secondary | ICD-10-CM

## 2019-08-22 NOTE — Progress Notes (Signed)
Office: [phone]  /  Fax: [phone] TeleHealth Visit:  MINERVIA OSSO has verbally consented to this TeleHealth visit today. The patient is located at home, the provider is located at the [facility] office. The participants in this visit include the listed provider and patient. The visit was conducted today via [employer].  HPI:  Chief Complaint: OBESITY Erin Warren is here to discuss her progress with her obesity treatment plan. She is on the Category 2 plan and states she is following her eating plan approximately 60% of the time. She states she is doing yoga/Tai Chi 60 minutes 5 times per week.  Erin Warren states that she has either gained or remains the same. She is getting adequate water and protein. She states she struggles with journaling.  Today's visit was #27 Starting weight: 240 lbs Starting date: 06/28/2018  Insulin Resistance Erin Warren has a diagnosis of insulin resistance and denies polyphagia.  ASSESSMENT AND PLAN:  Insulin resistance  Class 2 severe obesity with serious comorbidity and body mass index (BMI) of 39.0 to 39.9 in adult, unspecified obesity type ([place])  PLAN:  Insulin Resistance Besan will continue to work on weight loss, exercise, and decreasing carbohydrates and refined sugars, increase protein and healthy fats to help decrease the risk of diabetes. Erin Warren agreed to follow up with [name] as directed to closely monitor her progress.  Obesity [name] is currently in the action stage of change. As such, her goal is to continue with weight loss efforts. She has agreed to follow the Category 2 plan. Erin Warren will work on meal planning, intentional eating, decreasing carbs, and journaling (breakfast). Erin Warren has been instructed to exercise 5 days a week for weight loss and overall health benefits. We discussed the following Behavioral Modification Strategies today: increasing lean protein intake, decreasing simple carbohydrates, increasing vegetables, increase H20  intake, decrease eating out, no skipping meals, work on meal planning and easy cooking plans, and keeping healthy foods in the home.  Erin Warren has agreed to follow-up with our clinic in 3 weeks. She was informed of the importance of frequent follow-up visits to maximize her success with intensive lifestyle modifications for her multiple health conditions.  ALLERGIES: Allergies  Allergen Reactions  . Food Hives, Nausea Only and Swelling    WHEAT  . Garlic   . Onion   . Topamax [Topiramate] Other (See Comments)    Flushing, paresthesias (tingling, burning).      MEDICATIONS: Current Outpatient Medications on File Prior to Visit  Medication Sig Dispense Refill  . albuterol (PROVENTIL HFA;VENTOLIN HFA) 108 (90 BASE) MCG/ACT inhaler Inhale 2 puffs into the lungs every 6 (six) hours as needed. 1 Inhaler 3  . Cholecalciferol (VITAMIN D) 2000 UNITS tablet Take 2,000 Units by mouth daily.    [name] EPIPEN 2-PAK 0.3 MG/0.3ML SOAJ injection as needed.    . fluticasone (FLONASE) 50 MCG/ACT nasal spray SPRAY 2 SPRAYS INTO EACH NOSTRIL EVERY DAY 16 g 2  . levothyroxine (SYNTHROID, LEVOTHROID) 25 MCG tablet Take 1 tablet (25 mcg total) by mouth daily. 90 tablet 3  . loratadine (CLARITIN) 10 MG tablet Take 10 mg by mouth daily.    . metFORMIN (GLUCOPHAGE) 500 MG tablet TAKE 1 TABLET BY MOUTH TWICE A DAY 60 tablet 0  . propranolol ER (INDERAL LA) 80 MG 24 hr capsule Take 1 capsule (80 mg total) by mouth daily. 90 capsule 4  . QVAR REDIHALER 80 MCG/ACT inhaler TAKE 1 PUFF BY MOUTH TWICE A DAY  5  . ranitidine (  ZANTAC) 150 MG tablet Take 1 tablet (150 mg total) by mouth 2 (two) times daily. No more refills without office visit 180 tablet 0  . rizatriptan (MAXALT-MLT) 10 MG disintegrating tablet Take 1 tablet (10 mg total) by mouth as needed for migraine. May repeat in 2 hours if needed 9 tablet 12   No current facility-administered medications on file prior to visit.    PAST MEDICAL HISTORY: Past Medical  History:  Diagnosis Date  . AC (acromioclavicular) joint bone spurs   . Allergy   . Asthma   . Bunion   . Edema, lower extremity   . GERD (gastroesophageal reflux disease)   . Heel spur    Both heels, one has fractured off  . Hypertension   . Hypothyroidism   . Migraines   . Multiple food allergies   . Polycystic ovarian syndrome   . Seasonal allergies   . Thyroid disease   . Ulnar nerve abnormality     PAST SURGICAL HISTORY: Past Surgical History:  Procedure Laterality Date  . OOPHORECTOMY Left   . PLANTAR FASCIECTOMY Bilateral 08/2014  . TERATOMA EXCISION Left   . TUBAL LIGATION  2008    SOCIAL HISTORY: Social History   Tobacco Use  . Smoking status: Never Smoker  . Smokeless tobacco: Never Used  Substance Use Topics  . Alcohol use: Yes    Alcohol/week: 1.0 standard drinks    Types: 1 Glasses of wine per week    Comment: 1-4/month  . Drug use: No    FAMILY HISTORY: Family History  Problem Relation Age of Onset  . Thyroid disease Mother   . Allergies Mother   . Hypertension Father   . Heart disease Father   . Cancer Father        skin  . Sleep apnea Father   . Obesity Father   . Alcohol abuse Maternal Grandfather   . Hypertension Paternal Grandmother   . Hypertension Paternal Grandfather   . Diabetes Paternal Grandfather   . Hypertension Brother   . Heart Problems Brother    ROS: Review of Systems  Endo/Heme/Allergies:       Negative for polyphagia.   PHYSICAL EXAM: There were no vitals taken for this visit. There is no height or weight on file to calculate BMI. Physical Exam: Pt in no acute distress.  RECENT LABS AND TESTS: BMET    Component Value Date/Time   NA 138 11/14/2018 0831   K 4.7 11/14/2018 0831   CL 103 11/14/2018 0831   CO2 19 (L) 11/14/2018 0831   GLUCOSE 86 11/14/2018 0831   GLUCOSE 80 02/10/2016 1735   BUN 15 11/14/2018 0831   CREATININE 0.85 11/14/2018 0831   CREATININE 0.67 02/10/2016 1735   CALCIUM 9.3 11/14/2018  0831   GFRNONAA 84 11/14/2018 0831   GFRAA 96 11/14/2018 0831   Lab Results  Component Value Date   HGBA1C 5.1 11/14/2018   HGBA1C 5.2 06/28/2018   HGBA1C 4.7 09/27/2013   Lab Results  Component Value Date   INSULIN 20.4 11/14/2018   INSULIN 14.2 06/28/2018   CBC    Component Value Date/Time   WBC 11.6 (H) 06/28/2018 1037   WBC 11.6 (H) 02/03/2018 1620   RBC 4.85 06/28/2018 1037   RBC 4.72 02/03/2018 1620   HGB 14.8 06/28/2018 1037   HCT 43.8 06/28/2018 1037   PLT 375.0 02/03/2018 1620   PLT 435 (H) 10/18/2017 1640   MCV 90 06/28/2018 1037   MCH 30.5 06/28/2018 1037  MCH 31.3 02/04/2015 1109   MCHC 33.8 06/28/2018 1037   MCHC 34.1 02/03/2018 1620   RDW 13.2 06/28/2018 1037   LYMPHSABS 1.9 06/28/2018 1037   MONOABS 0.7 02/03/2018 1620   EOSABS 0.3 06/28/2018 1037   BASOSABS 0.1 06/28/2018 1037   Iron/TIBC/Ferritin/ %Sat No results found for: IRON, TIBC, FERRITIN, IRONPCTSAT Lipid Panel     Component Value Date/Time   CHOL 140 11/14/2018 0831   TRIG 201 (H) 11/14/2018 0831   HDL 45 11/14/2018 0831   CHOLHDL 3.1 10/18/2017 1640   CHOLHDL 2.7 02/04/2015 1109   VLDL 23 02/04/2015 1109   LDLCALC 55 11/14/2018 0831   Hepatic Function Panel     Component Value Date/Time   PROT 7.1 11/14/2018 0831   ALBUMIN 3.9 11/14/2018 0831   AST 16 11/14/2018 0831   ALT 22 11/14/2018 0831   ALKPHOS 96 11/14/2018 0831   BILITOT <0.2 11/14/2018 0831      Component Value Date/Time   TSH 1.710 06/28/2018 1037   TSH 2.930 10/18/2017 1640   TSH 3.010 08/17/2016 1650    OBESITY BEHAVIORAL INTERVENTION VISIT DOCUMENTATION FOR INSURANCE (~15 minutes)  I, Michaelene Song, am acting as Location manager for [employer], DO  I have reviewed the above documentation for accuracy and completeness, and I agree with the above. Jearld Lesch, DO

## 2019-09-11 ENCOUNTER — Ambulatory Visit (INDEPENDENT_AMBULATORY_CARE_PROVIDER_SITE_OTHER): Payer: 59 | Admitting: Bariatrics

## 2019-09-11 ENCOUNTER — Other Ambulatory Visit: Payer: Self-pay

## 2019-09-11 ENCOUNTER — Encounter (INDEPENDENT_AMBULATORY_CARE_PROVIDER_SITE_OTHER): Payer: Self-pay | Admitting: Bariatrics

## 2019-09-11 DIAGNOSIS — Z6839 Body mass index (BMI) 39.0-39.9, adult: Secondary | ICD-10-CM | POA: Diagnosis not present

## 2019-09-11 DIAGNOSIS — E038 Other specified hypothyroidism: Secondary | ICD-10-CM

## 2019-09-11 DIAGNOSIS — E66812 Other obesity due to excess calories: Secondary | ICD-10-CM

## 2019-09-11 DIAGNOSIS — E6609 Other obesity due to excess calories: Secondary | ICD-10-CM | POA: Diagnosis not present

## 2019-09-11 DIAGNOSIS — J4530 Mild persistent asthma, uncomplicated: Secondary | ICD-10-CM | POA: Diagnosis not present

## 2019-09-12 NOTE — Progress Notes (Signed)
TeleHealth Visit:  Due to the COVID-19 pandemic, this visit was completed with telemedicine (audio/video) technology to reduce patient and provider exposure as well as to preserve personal protective equipment.   Erin Warren has verbally consented to this TeleHealth visit. The patient is located at home, the provider is located at the [employer] [facility] office. The participants in this visit include the listed provider and patient. The visit was conducted today via Webex.  Chief Complaint: OBESITY Erin Warren is here to discuss her progress with her obesity treatment plan along with follow-up of her obesity related diagnoses. Erin Warren is on the Category 2 Plan and states she is following her eating plan approximately 50% of the time. Erin Warren states she is exercising 0 minutes 0 times per week.  Today's visit was #: 28 Starting weight: 240 lbs Starting date: 06/28/2018  Interim History: Erin Warren states that her weight remains the same. She had a good holiday. She has been doing more housework.   Subjective:   Other specified hypothyroidism.  Hypothyroidism is stable. No increased temperature sensitivity.   Mild persistent asthma in adult without complication.  Erin Warren is taking Proventil and QVAR. Asthma is triggered by exercise and she uses her inhaler before exercise.    Assessment/Plan:   Other specified hypothyroidism. Erin Warren will continue medications. She will get labs in the spring of 2021.  Mild persistent asthma in adult without complication. Erin Warren will continue medications and will use inhaler before exercise.  Class 2 obesity due to excess calories without serious comorbidity with body mass index (BMI) of 39.0 to 39.9 in adult.   Erin Warren is currently in the action stage of change. As such, her goal is to continue with weight loss efforts. She has agreed to Category 2 Plan. She will work on meal planning, intentional eating, increase her water intake (use app drink water),  and will record breakfast for the day.  We discussed the following exercise goals today: Erin Warren will do yoga and exercise regularly.  We discussed the following behavioral modification strategies today: increasing lean protein intake, decreasing simple carbohydrates, increasing vegetables, increasing water intake, decreasing eating out, no skipping meals, meal planning and cooking strategies, keeping healthy foods in the home and planning for success.  Erin Warren has agreed to follow-up with our clinic in 2-3 weeks. She was informed of the importance of frequent follow-up visits to maximize her success with intensive lifestyle modifications for her multiple health conditions.  Objective:   VITALS: Per patient if applicable, see vitals. GENERAL: Alert and in no acute distress. CARDIOPULMONARY: No increased WOB. Speaking in clear sentences.  PSYCH: Pleasant and cooperative. Speech normal rate and rhythm. Affect is appropriate. Insight and judgement are appropriate. Attention is focused, linear, and appropriate.  NEURO: Oriented as arrived to appointment on time with no prompting.   Lab Results  Component Value Date   CREATININE 0.85 11/14/2018   BUN 15 11/14/2018   NA 138 11/14/2018   K 4.7 11/14/2018   CL 103 11/14/2018   CO2 19 (L) 11/14/2018   Lab Results  Component Value Date   ALT 22 11/14/2018   AST 16 11/14/2018   ALKPHOS 96 11/14/2018   BILITOT <0.2 11/14/2018   Lab Results  Component Value Date   HGBA1C 5.1 11/14/2018   HGBA1C 5.2 06/28/2018   HGBA1C 4.7 09/27/2013   Lab Results  Component Value Date   INSULIN 20.4 11/14/2018   INSULIN 14.2 06/28/2018   Lab Results  Component  Value Date   TSH 1.710 06/28/2018   Lab Results  Component Value Date   CHOL 140 11/14/2018   HDL 45 11/14/2018   LDLCALC 55 11/14/2018   TRIG 201 (H) 11/14/2018   CHOLHDL 3.1 10/18/2017   Lab Results  Component Value Date   WBC 11.6 (H) 06/28/2018   HGB 14.8 06/28/2018   HCT  43.8 06/28/2018   MCV 90 06/28/2018   PLT 375.0 02/03/2018   No results found for: IRON, TIBC, FERRITIN Attestation Statements:   Reviewed by clinician on day of visit: allergies, medications, problem list, medical history, surgical history, family history, social history and previous encounter notes.  Migdalia Dk, am acting as Location manager for [employer], DO  I have reviewed the above documentation for accuracy and completeness, and I agree with the above. Jearld Lesch, DO

## 2019-09-15 ENCOUNTER — Encounter (INDEPENDENT_AMBULATORY_CARE_PROVIDER_SITE_OTHER): Payer: Self-pay | Admitting: Bariatrics

## 2019-09-25 ENCOUNTER — Other Ambulatory Visit: Payer: Self-pay

## 2019-09-25 ENCOUNTER — Encounter (INDEPENDENT_AMBULATORY_CARE_PROVIDER_SITE_OTHER): Payer: Self-pay | Admitting: Bariatrics

## 2019-09-25 ENCOUNTER — Telehealth (INDEPENDENT_AMBULATORY_CARE_PROVIDER_SITE_OTHER): Payer: 59 | Admitting: Bariatrics

## 2019-09-25 DIAGNOSIS — E038 Other specified hypothyroidism: Secondary | ICD-10-CM

## 2019-09-25 DIAGNOSIS — K219 Gastro-esophageal reflux disease without esophagitis: Secondary | ICD-10-CM | POA: Diagnosis not present

## 2019-09-25 DIAGNOSIS — Z6839 Body mass index (BMI) 39.0-39.9, adult: Secondary | ICD-10-CM

## 2019-09-25 DIAGNOSIS — E6609 Other obesity due to excess calories: Secondary | ICD-10-CM | POA: Diagnosis not present

## 2019-09-26 NOTE — Progress Notes (Signed)
TeleHealth Visit:  Due to the COVID-19 pandemic, this visit was completed with telemedicine (audio/video) technology to reduce patient and provider exposure as well as to preserve personal protective equipment.   Erin Warren has verbally consented to this TeleHealth visit. The patient is located at home, the provider is located at the [employer] [facility] office. The participants in this visit include the listed provider and patient. The visit was conducted today via Webex.  Chief Complaint: OBESITY Erin Warren is here to discuss her progress with her obesity treatment plan along with follow-up of her obesity related diagnoses. Erin Warren is on the Category 2 Plan and states she is following her eating plan approximately 60% of the time. Erin Warren states she is doing yoga 15 minutes 2 times per week.  Today's visit was #: 28 Starting weight: 240 lbs Starting date: 06/28/2018  Interim History: Erin Warren states that her weight remains the same. Her work is getting busy, but not overly stressful. She is getting better following the plan.  Subjective:   Gastroesophageal reflux disease without esophagitis. Erin Warren is taking Pepcid and staying away from certain foods.  Other specified hypothyroidism. Erin Warren is taking Synthroid.  Lab Results  Component Value Date   TSH 1.710 06/28/2018    Assessment/Plan:   Gastroesophageal reflux disease without esophagitis. Intensive lifestyle modifications are the first line treatment for this issue. We discussed several lifestyle modifications today and she will continue to work on diet, exercise and weight loss efforts. Orders and follow-up as documented in patient record. Erin Warren will continue her medications and will stay away from trigger foods.  Counseling . If a person has gastroesophageal reflux disease (GERD), food and stomach acid move back up into the esophagus and cause symptoms or problems such as damage to the esophagus. . Anti-reflux measures  include: raising the head of the bed, avoiding tight clothing or belts, avoiding eating late at night, not lying down shortly after mealtime, and achieving weight loss. . Avoid ASA, NSAID's, caffeine, alcohol, and tobacco.  . OTC Pepcid and/or Tums are often very helpful for as needed use.  [name] However, for persisting chronic or daily symptoms, stronger medications like Omeprazole may be needed. . You may need to avoid foods and drinks such as: ? Coffee and tea (with or without caffeine). ? Drinks that contain alcohol. ? Energy drinks and sports drinks. ? Bubbly (carbonated) drinks or sodas. ? Chocolate and cocoa. ? Peppermint and mint flavorings. ? Garlic and onions. ? Horseradish. ? Spicy and acidic foods. These include peppers, chili powder, curry powder, vinegar, hot sauces, and BBQ sauce. ? Citrus fruit juices and citrus fruits, such as oranges, lemons, and limes. ? Tomato-based foods. These include red sauce, chili, salsa, and pizza with red sauce. ? Fried and fatty foods. These include donuts, french fries, potato chips, and high-fat dressings. High-fat meats. These include hot dogs, rib eye steak, sausage, ham, and bacon.  Other specified hypothyroidism. Patient with long-standing hypothyroidism, on levothyroxine therapy. She appears euthyroid. Orders and follow up as documented in patient record. Erin Warren will continue her medication. She is aware that keeping her TSH within normal limits is best for weight loss and maintenance.  Counseling . Good thyroid control is important for overall health. Supratherapeutic thyroid levels are dangerous and will not improve weight loss results. The correct way to take levothyroxine is fasting, with water, separated by at least 30 minutes from breakfast, and separated by more than 4 hours from calcium, iron, multivitamins, acid reflux medications (  PPIs).   Class 2 obesity due to excess calories without serious comorbidity with body mass index (BMI) of  39.0 to 39.9 in adult.  Erin Warren is currently in the action stage of change. As such, her goal is to continue with weight loss efforts. She has agreed to the Category 2 Plan.   She will work on meal planning, intentional eating, will be adherent to the plan, and will continue to do more journaling.  Exercise goals: Erin Warren will resume more yoga and will be more consistent with it.  Behavioral modification strategies: increasing lean protein intake, decreasing simple carbohydrates, increasing vegetables, increasing water intake, decreasing eating out, no skipping meals, meal planning and cooking strategies and keeping healthy foods in the home.  Erin Warren has agreed to follow-up with our clinic in 2 weeks. She was informed of the importance of frequent follow-up visits to maximize her success with intensive lifestyle modifications for her multiple health conditions.  Objective:   VITALS: Per patient if applicable, see vitals. GENERAL: Alert and in no acute distress. CARDIOPULMONARY: No increased WOB. Speaking in clear sentences.  PSYCH: Pleasant and cooperative. Speech normal rate and rhythm. Affect is appropriate. Insight and judgement are appropriate. Attention is focused, linear, and appropriate.  NEURO: Oriented as arrived to appointment on time with no prompting.   Lab Results  Component Value Date   CREATININE 0.85 11/14/2018   BUN 15 11/14/2018   NA 138 11/14/2018   K 4.7 11/14/2018   CL 103 11/14/2018   CO2 19 (L) 11/14/2018   Lab Results  Component Value Date   ALT 22 11/14/2018   AST 16 11/14/2018   ALKPHOS 96 11/14/2018   BILITOT <0.2 11/14/2018   Lab Results  Component Value Date   HGBA1C 5.1 11/14/2018   HGBA1C 5.2 06/28/2018   HGBA1C 4.7 09/27/2013   Lab Results  Component Value Date   INSULIN 20.4 11/14/2018   INSULIN 14.2 06/28/2018   Lab Results  Component Value Date   TSH 1.710 06/28/2018   Lab Results  Component Value Date   CHOL 140 11/14/2018   HDL 45  11/14/2018   LDLCALC 55 11/14/2018   TRIG 201 (H) 11/14/2018   CHOLHDL 3.1 10/18/2017   Lab Results  Component Value Date   WBC 11.6 (H) 06/28/2018   HGB 14.8 06/28/2018   HCT 43.8 06/28/2018   MCV 90 06/28/2018   PLT 375.0 02/03/2018    Attestation Statements:   Reviewed by clinician on day of visit: allergies, medications, problem list, medical history, surgical history, family history, social history, and previous encounter notes.  Time spent on visit including pre-visit chart review and post-visit care was 20 minutes.   Migdalia Dk, am acting as Location manager for [employer], DO   I have reviewed the above documentation for accuracy and completeness, and I agree with the above. Jearld Lesch, DO

## 2019-10-03 ENCOUNTER — Ambulatory Visit: Payer: Managed Care, Other (non HMO) | Admitting: Diagnostic Neuroimaging

## 2019-10-03 ENCOUNTER — Other Ambulatory Visit: Payer: Self-pay

## 2019-10-03 ENCOUNTER — Encounter: Payer: Self-pay | Admitting: Diagnostic Neuroimaging

## 2019-10-03 VITALS — BP 149/98 | HR 87 | Temp 97.6°F | Ht 66.0 in | Wt 238.4 lb

## 2019-10-03 DIAGNOSIS — G43809 Other migraine, not intractable, without status migrainosus: Secondary | ICD-10-CM | POA: Diagnosis not present

## 2019-10-03 MED ORDER — PROPRANOLOL HCL ER 80 MG PO CP24: 80.0000 mg | ORAL_CAPSULE | Freq: Every day | ORAL | 4 refills | Status: DC

## 2019-10-03 MED ORDER — RIZATRIPTAN BENZOATE 10 MG PO TBDP: 10.0000 mg | ORAL_TABLET | ORAL | 12 refills | Status: DC | PRN
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
## 2019-10-11 ENCOUNTER — Encounter (INDEPENDENT_AMBULATORY_CARE_PROVIDER_SITE_OTHER): Payer: Self-pay | Admitting: Bariatrics

## 2019-10-11 ENCOUNTER — Telehealth (INDEPENDENT_AMBULATORY_CARE_PROVIDER_SITE_OTHER): Payer: Managed Care, Other (non HMO) | Admitting: Bariatrics

## 2019-10-11 ENCOUNTER — Other Ambulatory Visit: Payer: Self-pay

## 2019-10-11 DIAGNOSIS — E039 Hypothyroidism, unspecified: Secondary | ICD-10-CM | POA: Diagnosis not present

## 2019-10-11 DIAGNOSIS — E781 Pure hyperglyceridemia: Secondary | ICD-10-CM

## 2019-10-11 DIAGNOSIS — Z6835 Body mass index (BMI) 35.0-35.9, adult: Secondary | ICD-10-CM | POA: Diagnosis not present

## 2019-10-11 NOTE — Progress Notes (Signed)
TeleHealth Visit:  Due to the COVID-19 pandemic, this visit was completed with telemedicine (audio/video) technology to reduce patient and provider exposure as well as to preserve personal protective equipment.   Erin Warren has verbally consented to this TeleHealth visit. The patient is located at home, the provider is located at the [facility] office. The participants in this visit include the listed provider and patient. The visit was conducted today via Webex.  Chief Complaint: OBESITY Erin Warren is here to discuss her progress with her obesity treatment plan along with follow-up of her obesity related diagnoses. Erin Warren is on the Category 2 Plan and states she is following her eating plan approximately 60% of the time. Erin Warren states she is doing yoga 20 minutes 2 times per week.  Today's visit was #: 30 Starting weight: 240 lbs Starting date: 06/28/2018  Interim History: Erin Warren states that her weight remains the same. She had her anniversary on Tuesday and was off the plan slightly. She reports getting adequate water intake.  Subjective:   Hypertriglyceridemia. Erin Warren's triglycerides were 201 on 11/14/2018.  Hypothyroidism, unspecified type. Hypothyroidism is stable, decreased slightly.  Lab Results  Component Value Date   TSH 1.710 06/28/2018   Assessment/Plan:   Hypertriglyceridemia. Erin Warren will decrease carbohydrates, increase healthy fats, PUFA's and MUFA's.  Hypothyroidism, unspecified type. Patient with long-standing hypothyroidism, on levothyroxine therapy. She appears euthyroid. Orders and follow up as documented in patient record. Erin Warren will continue her medications as prescribed.  Counseling . Good thyroid control is important for overall health. Supratherapeutic thyroid levels are dangerous and will not improve weight loss results. . The correct way to take levothyroxine is fasting, with water, separated by at least 30 minutes from breakfast, and  separated by more than 4 hours from calcium, iron, multivitamins, acid reflux medications (PPIs).   Class 2 severe obesity with serious comorbidity and body mass index (BMI) of 35.0 to 35.9 in adult, unspecified obesity type (Erin Warren).  Erin Warren is currently in the action stage of change. As such, her goal is to continue with weight loss efforts. She has agreed to the Category 2 Plan.   She will work on meal planning, intentional eating, recording her food, and decreasing carbohydrates.  Exercise goals: Erin Warren will increase frequency and duration of exercise.  Behavioral modification strategies: increasing lean protein intake, decreasing simple carbohydrates, increasing vegetables, increasing water intake, decreasing eating out, no skipping meals, meal planning and cooking strategies, keeping healthy foods in the home and planning for success.  Erin Warren has agreed to follow-up with our clinic in 2 weeks. She was informed of the importance of frequent follow-up visits to maximize her success with intensive lifestyle modifications for her multiple health conditions.  Objective:   VITALS: Per patient if applicable, see vitals. GENERAL: Alert and in no acute distress. CARDIOPULMONARY: No increased WOB. Speaking in clear sentences.  PSYCH: Pleasant and cooperative. Speech normal rate and rhythm. Affect is appropriate. Insight and judgement are appropriate. Attention is focused, linear, and appropriate.  NEURO: Oriented as arrived to appointment on time with no prompting.   Lab Results  Component Value Date   CREATININE 0.85 11/14/2018   BUN 15 11/14/2018   NA 138 11/14/2018   K 4.7 11/14/2018   CL 103 11/14/2018   CO2 19 (L) 11/14/2018   Lab Results  Component Value Date   ALT 22 11/14/2018   AST 16 11/14/2018   ALKPHOS 96 11/14/2018   BILITOT <0.2 11/14/2018   Lab Results  Component  Value Date   HGBA1C 5.1 11/14/2018   HGBA1C 5.2 06/28/2018   HGBA1C 4.7 09/27/2013   Lab Results    Component Value Date   INSULIN 20.4 11/14/2018   INSULIN 14.2 06/28/2018   Lab Results  Component Value Date   TSH 1.710 06/28/2018   Lab Results  Component Value Date   CHOL 140 11/14/2018   HDL 45 11/14/2018   LDLCALC 55 11/14/2018   TRIG 201 (H) 11/14/2018   CHOLHDL 3.1 10/18/2017   Lab Results  Component Value Date   WBC 11.6 (H) 06/28/2018   HGB 14.8 06/28/2018   HCT 43.8 06/28/2018   MCV 90 06/28/2018   PLT 375.0 02/03/2018   No results found for: IRON, TIBC, FERRITIN  Attestation Statements:   Reviewed by clinician on day of visit: allergies, medications, problem list, medical history, surgical history, family history, social history, and previous encounter notes.  Time spent on visit including pre-visit chart review and post-visit care was 20 minutes.   Migdalia Dk, am acting as Location manager for [employer], DO   I have reviewed the above documentation for accuracy and completeness, and I agree with the above. Jearld Lesch, DO

## 2019-10-24 ENCOUNTER — Encounter (INDEPENDENT_AMBULATORY_CARE_PROVIDER_SITE_OTHER): Payer: Self-pay

## 2019-10-25 ENCOUNTER — Telehealth (INDEPENDENT_AMBULATORY_CARE_PROVIDER_SITE_OTHER): Payer: Managed Care, Other (non HMO) | Admitting: Bariatrics

## 2019-11-27 ENCOUNTER — Other Ambulatory Visit: Payer: Self-pay

## 2019-11-27 ENCOUNTER — Telehealth (INDEPENDENT_AMBULATORY_CARE_PROVIDER_SITE_OTHER): Payer: 59 | Admitting: Bariatrics

## 2019-11-27 ENCOUNTER — Encounter (INDEPENDENT_AMBULATORY_CARE_PROVIDER_SITE_OTHER): Payer: Self-pay | Admitting: Bariatrics

## 2019-11-27 DIAGNOSIS — K219 Gastro-esophageal reflux disease without esophagitis: Secondary | ICD-10-CM

## 2019-11-27 DIAGNOSIS — Z6838 Body mass index (BMI) 38.0-38.9, adult: Secondary | ICD-10-CM | POA: Diagnosis not present

## 2019-11-27 DIAGNOSIS — E282 Polycystic ovarian syndrome: Secondary | ICD-10-CM | POA: Diagnosis not present

## 2019-11-27 NOTE — Progress Notes (Signed)
TeleHealth Visit:  Due to the COVID-19 pandemic, this visit was completed with telemedicine (audio/video) technology to reduce patient and provider exposure as well as to preserve personal protective equipment.   Erin Warren has verbally consented to this TeleHealth visit. The patient is located at home, the provider is located at the [facility] office. The participants in this visit include the listed provider and patient. The visit was conducted today via Webex.  Chief Complaint: OBESITY Erin Warren is here to discuss her progress with her obesity treatment plan along with follow-up of her obesity related diagnoses. Erin Warren is on the Category 2 Plan and states she is following her eating plan approximately 50% of the time. Erin Warren states she is exercising 0 minutes 0 times per week.  Today's visit was #: 31 Starting weight: 240 lbs Starting date: 06/28/2018  Interim History: Erin Warren states that her weight remains the same. She reports doing well with her water intake. She states she has been eating out too much.  Subjective:   PCOS (polycystic ovarian syndrome). No polyphagia.  Gastroesophageal reflux disease without esophagitis. Erin Warren takes Pepcid.  Assessment/Plan:   PCOS (polycystic ovarian syndrome). Erin Warren will decrease carbohydrates. Will do fasting labs at her next visit.  Gastroesophageal reflux disease without esophagitis. Intensive lifestyle modifications are the first line treatment for this issue. We discussed several lifestyle modifications today and she will continue to work on diet, exercise and weight loss efforts. Orders and follow up as documented in patient record. She will continue her medication as directed.  Counseling . If a person has gastroesophageal reflux disease (GERD), food and stomach acid move back up into the esophagus and cause symptoms or problems such as damage to the esophagus. . Anti-reflux measures include: raising the head of the bed,  avoiding tight clothing or belts, avoiding eating late at night, not lying down shortly after mealtime, and achieving weight loss. . Avoid ASA, NSAID's, caffeine, alcohol, and tobacco.  . OTC Pepcid and/or Tums are often very helpful for as needed use.  [name] However, for persisting chronic or daily symptoms, stronger medications like Omeprazole may be needed. . You may need to avoid foods and drinks such as: ? Coffee and tea (with or without caffeine). ? Drinks that contain alcohol. ? Energy drinks and sports drinks. ? Bubbly (carbonated) drinks or sodas. ? Chocolate and cocoa. ? Peppermint and mint flavorings. ? Garlic and onions. ? Horseradish. ? Spicy and acidic foods. These include peppers, chili powder, curry powder, vinegar, hot sauces, and BBQ sauce. ? Citrus fruit juices and citrus fruits, such as oranges, lemons, and limes. ? Tomato-based foods. These include red sauce, chili, salsa, and pizza with red sauce. ? Fried and fatty foods. These include donuts, french fries, potato chips, and high-fat dressings. ? High-fat meats. These include hot dogs, rib eye steak, sausage, ham, and bacon.  Class 2 severe obesity with serious comorbidity and body mass index (BMI) of 38.0 to 38.9 in adult, unspecified obesity type ([place]).  Erin Warren is currently in the action stage of change. As such, her goal is to continue with weight loss efforts. She has agreed to the Category 2 Plan.   She will work on meal planning and intentional eating.  Exercise goals: Erin Warren will start with yoga and Tai Chi.  Behavioral modification strategies: increasing lean protein intake, decreasing simple carbohydrates, increasing vegetables, increasing water intake, decreasing eating out, no skipping meals, meal planning and cooking strategies, keeping healthy foods in the home and  planning for success.  Erin Warren has agreed to follow-up with our clinic in 2-3 weeks. She was informed of the importance of frequent follow-up visits  to maximize her success with intensive lifestyle modifications for her multiple health conditions.  Objective:   VITALS: Per patient if applicable, see vitals. GENERAL: Alert and in no acute distress. CARDIOPULMONARY: No increased WOB. Speaking in clear sentences.  PSYCH: Pleasant and cooperative. Speech normal rate and rhythm. Affect is appropriate. Insight and judgement are appropriate. Attention is focused, linear, and appropriate.  NEURO: Oriented as arrived to appointment on time with no prompting.   Lab Results  Component Value Date   CREATININE 0.85 11/14/2018   BUN 15 11/14/2018   NA 138 11/14/2018   K 4.7 11/14/2018   CL 103 11/14/2018   CO2 19 (L) 11/14/2018   Lab Results  Component Value Date   ALT 22 11/14/2018   AST 16 11/14/2018   ALKPHOS 96 11/14/2018   BILITOT <0.2 11/14/2018   Lab Results  Component Value Date   HGBA1C 5.1 11/14/2018   HGBA1C 5.2 06/28/2018   HGBA1C 4.7 09/27/2013   Lab Results  Component Value Date   INSULIN 20.4 11/14/2018   INSULIN 14.2 06/28/2018   Lab Results  Component Value Date   TSH 1.710 06/28/2018   Lab Results  Component Value Date   CHOL 140 11/14/2018   HDL 45 11/14/2018   LDLCALC 55 11/14/2018   TRIG 201 (H) 11/14/2018   CHOLHDL 3.1 10/18/2017   Lab Results  Component Value Date   WBC 11.6 (H) 06/28/2018   HGB 14.8 06/28/2018   HCT 43.8 06/28/2018   MCV 90 06/28/2018   PLT 375.0 02/03/2018   No results found for: IRON, TIBC, FERRITIN  Attestation Statements:   Reviewed by clinician on day of visit: allergies, medications, problem list, medical history, surgical history, family history, social history, and previous encounter notes.  Time spent on visit including pre-visit chart review and post-visit charting and care was 20 minutes.   Migdalia Dk, am acting as Location manager for [employer], DO   I have reviewed the above documentation for accuracy and completeness, and I agree with the above. Jearld Lesch, DO

## 2019-12-13 ENCOUNTER — Ambulatory Visit (INDEPENDENT_AMBULATORY_CARE_PROVIDER_SITE_OTHER): Payer: 59 | Admitting: Bariatrics

## 2019-12-13 ENCOUNTER — Other Ambulatory Visit: Payer: Self-pay

## 2019-12-13 ENCOUNTER — Encounter (INDEPENDENT_AMBULATORY_CARE_PROVIDER_SITE_OTHER): Payer: Self-pay | Admitting: Bariatrics

## 2019-12-13 VITALS — BP 145/96 | HR 85 | Temp 98.1°F | Ht 65.0 in | Wt 236.0 lb

## 2019-12-13 DIAGNOSIS — Z6839 Body mass index (BMI) 39.0-39.9, adult: Secondary | ICD-10-CM

## 2019-12-13 DIAGNOSIS — E038 Other specified hypothyroidism: Secondary | ICD-10-CM

## 2019-12-13 DIAGNOSIS — E8881 Metabolic syndrome: Secondary | ICD-10-CM | POA: Diagnosis not present

## 2019-12-13 DIAGNOSIS — E6609 Other obesity due to excess calories: Secondary | ICD-10-CM

## 2019-12-13 DIAGNOSIS — Z9189 Other specified personal risk factors, not elsewhere classified: Secondary | ICD-10-CM

## 2019-12-13 NOTE — Progress Notes (Signed)
Chief Complaint:   OBESITY Erin Warren is here to discuss her progress with her obesity treatment plan along with follow-up of her obesity related diagnoses. Erin Warren is keeping a food journal and adhering to recommended goals of 1350 calories and 80-85 grams of protein and states she is following her eating plan approximately 50% of the time. Erin Warren states she is walking 15-20 minutes 3 times per week.  Today's visit was #: 64 Starting weight: 240 lbs Starting date: 06/28/2018 Today's weight: 236 lbs Today's date: 12/13/2019 Total lbs lost to date: 4 Total lbs lost since last in-office visit: 0  Interim History: Erin Warren is up 1 lb. Her last visit in our office was 11/14/2018. She reports doing okay with her water intake.  Subjective:   Other specified hypothyroidism. Erin Warren is taking Synthroid.   Lab Results  Component Value Date   TSH 1.710 06/28/2018   Insulin resistance. Erin Warren has a diagnosis of insulin resistance based on her elevated fasting insulin level >5. She continues to work on diet and exercise to decrease her risk of diabetes. Erin Warren is on no medications. She has a history of PCOS.  Lab Results  Component Value Date   INSULIN 20.4 11/14/2018   INSULIN 14.2 06/28/2018   Lab Results  Component Value Date   HGBA1C 5.1 11/14/2018   At risk for hypoglycemia. Erin Warren is at increased risk for hypoglycemia due to insulin resistance and dietary changes.   Assessment/Plan:   Other specified hypothyroidism. Patient with long-standing hypothyroidism, on levothyroxine therapy. She appears euthyroid. Orders and follow up as documented in patient record. Will check TSH today.  Counseling . Good thyroid control is important for overall health. Supratherapeutic thyroid levels are dangerous and will not improve weight loss results. . The correct way to take levothyroxine is fasting, with water, separated by at least 30 minutes from breakfast, and separated by more than 4 hours  from calcium, iron, multivitamins, acid reflux medications (PPIs).   Insulin resistance. Erin Warren will continue to work on weight loss, exercise, and decreasing simple carbohydrates to help decrease the risk of diabetes. Erin Warren agreed to follow-up with [name] as directed to closely monitor her progress. Will check A1c and insulin today.  At risk for hypoglycemia. Erin Warren was given approximately 15 minutes of counseling today regarding prevention of hypoglycemia. She was advised of symptoms of hypoglycemia. Erin Warren was instructed to avoid skipping meals, eat regular protein rich meals and schedule low calorie snacks as needed.   Repetitive spaced learning was employed today to elicit superior memory formation and behavioral change.  Class 2 obesity due to excess calories without serious comorbidity with body mass index (BMI) of 39.0 to 39.9 in adult.  Erin Warren is currently in the action stage of change. As such, her goal is to continue with weight loss efforts. She has agreed to keeping a food journal and adhering to recommended goals of 1350 calories and 80-85 grams of protein.   She will work on meal planning, mindful eating, and will manage carbohydrates and limit carbohydrates with meals.  Exercise goals: Erin Warren will continue walking.  Behavioral modification strategies: increasing lean protein intake, decreasing simple carbohydrates, increasing vegetables, increasing water intake, decreasing eating out, no skipping meals, meal planning and cooking strategies, keeping healthy foods in the home, ways to avoid boredom eating, ways to avoid night time snacking, better snacking choices, emotional eating strategies and planning for success.  Erin Warren has agreed to follow-up with our clinic in 2 weeks. She was informed  of the importance of frequent follow-up visits to maximize her success with intensive lifestyle modifications for her multiple health conditions.   Erin Warren was informed we would discuss her lab results at  her next visit unless there is a critical issue that needs to be addressed sooner. Erin Warren agreed to keep her next visit at the agreed upon time to discuss these results.  Objective:   Blood pressure (!) 145/96, pulse 85, temperature 98.1 F (36.7 C), height 5\' 5"  (1.651 m), weight 236 lb (107 kg), last menstrual period 12/01/2019, SpO2 96 %. Body mass index is 39.27 kg/m.  General: Cooperative, alert, well developed, in no acute distress. HEENT: Conjunctivae and lids unremarkable. Cardiovascular: Regular rhythm.  Lungs: Normal work of breathing. Neurologic: No focal deficits.   Lab Results  Component Value Date   CREATININE 0.85 11/14/2018   BUN 15 11/14/2018   NA 138 11/14/2018   K 4.7 11/14/2018   CL 103 11/14/2018   CO2 19 (L) 11/14/2018   Lab Results  Component Value Date   ALT 22 11/14/2018   AST 16 11/14/2018   ALKPHOS 96 11/14/2018   BILITOT <0.2 11/14/2018   Lab Results  Component Value Date   HGBA1C 5.1 11/14/2018   HGBA1C 5.2 06/28/2018   HGBA1C 4.7 09/27/2013   Lab Results  Component Value Date   INSULIN 20.4 11/14/2018   INSULIN 14.2 06/28/2018   Lab Results  Component Value Date   TSH 1.710 06/28/2018   Lab Results  Component Value Date   CHOL 140 11/14/2018   HDL 45 11/14/2018   LDLCALC 55 11/14/2018   TRIG 201 (H) 11/14/2018   CHOLHDL 3.1 10/18/2017   Lab Results  Component Value Date   WBC 11.6 (H) 06/28/2018   HGB 14.8 06/28/2018   HCT 43.8 06/28/2018   MCV 90 06/28/2018   PLT 375.0 02/03/2018   No results found for: IRON, TIBC, FERRITIN  Attestation Statements:   Reviewed by clinician on day of visit: allergies, medications, problem list, medical history, surgical history, family history, social history, and previous encounter notes.  Migdalia Dk, am acting as Location manager for [employer], DO   I have reviewed the above documentation for accuracy and completeness, and I agree with the above. Jearld Lesch, DO

## 2019-12-13 NOTE — Addendum Note (Signed)
Addended by: Wilfrid Lund on: 12/13/2019 04:18 PM   Modules accepted: Orders

## 2019-12-14 LAB — LIPID PANEL WITH LDL/HDL RATIO
Cholesterol, Total: 194 mg/dL (ref 100–199)
HDL: 67 mg/dL (ref >39)
LDL Chol Calc (NIH): 105 mg/dL — ABNORMAL HIGH (ref 0–99)
LDL/HDL Ratio: 1.6 ratio (ref 0.0–3.2)
Triglycerides: 123 mg/dL (ref 0–149)
VLDL Cholesterol Cal: 22 mg/dL (ref 5–40)

## 2019-12-14 LAB — COMPREHENSIVE METABOLIC PANEL
ALT: 20 IU/L (ref 0–32)
AST: 19 IU/L (ref 0–40)
Albumin/Globulin Ratio: 1.3 (ref 1.2–2.2)
Albumin: 4.2 g/dL (ref 3.8–4.8)
Alkaline Phosphatase: 105 IU/L (ref 39–117)
BUN/Creatinine Ratio: 20 (ref 9–23)
BUN: 17 mg/dL (ref 6–24)
Bilirubin Total: <0.2 mg/dL (ref 0.0–1.2)
CO2: 20 mmol/L (ref 20–29)
Calcium: 9.7 mg/dL (ref 8.7–10.2)
Chloride: 102 mmol/L (ref 96–106)
Creatinine, Ser: 0.83 mg/dL (ref 0.57–1.00)
GFR calc Af Amer: 98 mL/min/{1.73_m2} (ref >59)
GFR calc non Af Amer: 85 mL/min/{1.73_m2} (ref >59)
Globulin, Total: 3.2 g/dL (ref 1.5–4.5)
Glucose: 81 mg/dL (ref 65–99)
Potassium: 4.4 mmol/L (ref 3.5–5.2)
Sodium: 139 mmol/L (ref 134–144)
Total Protein: 7.4 g/dL (ref 6.0–8.5)

## 2019-12-14 LAB — TSH: TSH: 2.58 u[IU]/mL (ref 0.450–4.500)

## 2019-12-14 LAB — HEMOGLOBIN A1C
Est. average glucose Bld gHb Est-mCnc: 103 mg/dL
Hgb A1c MFr Bld: 5.2 % (ref 4.8–5.6)

## 2019-12-14 LAB — T3: T3, Total: 139 ng/dL (ref 71–180)

## 2019-12-14 LAB — T4, FREE: Free T4: 1.49 ng/dL (ref 0.82–1.77)

## 2019-12-14 LAB — VITAMIN D 25 HYDROXY (VIT D DEFICIENCY, FRACTURES): Vit D, 25-Hydroxy: 50.3 ng/mL (ref 30.0–100.0)

## 2019-12-14 LAB — INSULIN, RANDOM: INSULIN: 16.5 u[IU]/mL (ref 2.6–24.9)

## 2020-01-01 ENCOUNTER — Other Ambulatory Visit: Payer: Self-pay

## 2020-01-01 ENCOUNTER — Encounter (INDEPENDENT_AMBULATORY_CARE_PROVIDER_SITE_OTHER): Payer: Self-pay | Admitting: Bariatrics

## 2020-01-01 ENCOUNTER — Ambulatory Visit (INDEPENDENT_AMBULATORY_CARE_PROVIDER_SITE_OTHER): Payer: 59 | Admitting: Bariatrics

## 2020-01-01 VITALS — BP 155/91 | HR 86 | Temp 99.1°F | Ht 65.0 in | Wt 238.0 lb

## 2020-01-01 DIAGNOSIS — Z6839 Body mass index (BMI) 39.0-39.9, adult: Secondary | ICD-10-CM | POA: Diagnosis not present

## 2020-01-01 DIAGNOSIS — E038 Other specified hypothyroidism: Secondary | ICD-10-CM | POA: Diagnosis not present

## 2020-01-01 DIAGNOSIS — E8881 Metabolic syndrome: Secondary | ICD-10-CM

## 2020-01-02 NOTE — Progress Notes (Signed)
Chief Complaint:   OBESITY Erin Warren is here to discuss her progress with her obesity treatment plan along with follow-up of her obesity related diagnoses. Erin Warren is keeping a food journal and adhering to recommended goals of 1350 calories and 80-85 protein and states she is following her eating plan approximately 50% of the time. Erin Warren states she is walking 20 minutes 2 times per week.  Today's visit was #: 18 Starting weight: 240 lbs Starting date: 06/28/2018 Today's weight: 238 lbs Today's date: 01/01/2020 Total lbs lost to date: 2 Total lbs lost since last in-office visit: 0  Interim History: Erin Warren is up 2 lbs. Her body water is up about 4 lbs according to the bioimpedance scale.  Subjective:   Insulin resistance. Erin Warren has a diagnosis of insulin resistance based on her elevated fasting insulin level >5. She continues to work on diet and exercise to decrease her risk of diabetes. She states her appetite fluctuates.   Lab Results  Component Value Date   INSULIN 16.5 12/13/2019   INSULIN 20.4 11/14/2018   INSULIN 14.2 06/28/2018   Lab Results  Component Value Date   HGBA1C 5.2 12/13/2019   Other specified hypothyroidism. Erin Warren is taking Synthroid.   Lab Results  Component Value Date   TSH 2.580 12/13/2019   Assessment/Plan:   Insulin resistance. Erin Warren will continue to work on weight loss, exercise, increasing healthy fats and protein, and decreasing simple carbohydrates to help decrease the risk of diabetes. Erin Warren agreed to follow-up with [name] as directed to closely monitor her progress.  Other specified hypothyroidism. Patient with long-standing hypothyroidism, on levothyroxine therapy. She appears euthyroid. Orders and follow up as documented in patient record. Erin Warren will continue Synthroid as directed.  Counseling . Good thyroid control is important for overall health. Supratherapeutic thyroid levels are dangerous and will not improve weight loss results. . The  correct way to take levothyroxine is fasting, with water, separated by at least 30 minutes from breakfast, and separated by more than 4 hours from calcium, iron, multivitamins, acid reflux medications (PPIs).   Class 2 severe obesity with serious comorbidity and body mass index (BMI) of 39.0 to 39.9 in adult, unspecified obesity type ([place]).  Erin Warren is currently in the action stage of change. As such, her goal is to continue with weight loss efforts. She has agreed to keeping a food journal and adhering to recommended goals of 1350 calories and 80-85 grams of protein.   She will work on meal planning and intentional eating.  We reviewed with the patient labs from 12/13/2019 including CMP, lipids, Vitamin D, A1c, insulin, and thyroid panel.  She will track on [employer]itness [employer] or [employer].  Exercise goals: Erin Warren will continue walking.  Behavioral modification strategies: increasing lean protein intake, decreasing simple carbohydrates, increasing vegetables, increasing water intake, decreasing eating out, no skipping meals, meal planning and cooking strategies and keeping healthy foods in the home.  Erin Warren has agreed to follow-up with our clinic in 2-3 weeks. She was informed of the importance of frequent follow-up visits to maximize her success with intensive lifestyle modifications for her multiple health conditions.   Objective:   Blood pressure (!) 155/91, pulse 86, temperature 99.1 F (37.3 C), height 5\' 5"  (1.651 m), weight 238 lb (108 kg), last menstrual period 12/26/2019, SpO2 95 %. Body mass index is 39.61 kg/m.  General: Cooperative, alert, well developed, in no acute distress. HEENT: Conjunctivae and lids unremarkable. Cardiovascular: Regular rhythm.  Lungs: Normal work  of breathing. Neurologic: No focal deficits.   Lab Results  Component Value Date   CREATININE 0.83 12/13/2019   BUN 17 12/13/2019   NA 139 12/13/2019   K 4.4 12/13/2019   CL 102 12/13/2019   CO2 20  12/13/2019   Lab Results  Component Value Date   ALT 20 12/13/2019   AST 19 12/13/2019   ALKPHOS 105 12/13/2019   BILITOT <0.2 12/13/2019   Lab Results  Component Value Date   HGBA1C 5.2 12/13/2019   HGBA1C 5.1 11/14/2018   HGBA1C 5.2 06/28/2018   HGBA1C 4.7 09/27/2013   Lab Results  Component Value Date   INSULIN 16.5 12/13/2019   INSULIN 20.4 11/14/2018   INSULIN 14.2 06/28/2018   Lab Results  Component Value Date   TSH 2.580 12/13/2019   Lab Results  Component Value Date   CHOL 194 12/13/2019   HDL 67 12/13/2019   LDLCALC 105 (H) 12/13/2019   TRIG 123 12/13/2019   CHOLHDL 3.1 10/18/2017   Lab Results  Component Value Date   WBC 11.6 (H) 06/28/2018   HGB 14.8 06/28/2018   HCT 43.8 06/28/2018   MCV 90 06/28/2018   PLT 375.0 02/03/2018   No results found for: IRON, TIBC, FERRITIN  Attestation Statements:   Reviewed by clinician on day of visit: allergies, medications, problem list, medical history, surgical history, family history, social history, and previous encounter notes.  Migdalia Dk, am acting as Location manager for [employer], DO   I have reviewed the above documentation for accuracy and completeness, and I agree with the above. Erin Lesch, DO

## 2020-01-03 ENCOUNTER — Encounter (INDEPENDENT_AMBULATORY_CARE_PROVIDER_SITE_OTHER): Payer: Self-pay | Admitting: Bariatrics

## 2020-01-22 ENCOUNTER — Encounter (INDEPENDENT_AMBULATORY_CARE_PROVIDER_SITE_OTHER): Payer: Self-pay | Admitting: Bariatrics

## 2020-01-22 ENCOUNTER — Ambulatory Visit (INDEPENDENT_AMBULATORY_CARE_PROVIDER_SITE_OTHER): Payer: 59 | Admitting: Bariatrics

## 2020-01-22 ENCOUNTER — Other Ambulatory Visit: Payer: Self-pay

## 2020-01-22 VITALS — BP 142/99 | HR 90 | Temp 98.6°F | Ht 65.0 in | Wt 240.0 lb

## 2020-01-22 DIAGNOSIS — E6609 Other obesity due to excess calories: Secondary | ICD-10-CM

## 2020-01-22 DIAGNOSIS — Z6839 Body mass index (BMI) 39.0-39.9, adult: Secondary | ICD-10-CM

## 2020-01-22 DIAGNOSIS — E038 Other specified hypothyroidism: Secondary | ICD-10-CM | POA: Diagnosis not present

## 2020-01-22 DIAGNOSIS — E8881 Metabolic syndrome: Secondary | ICD-10-CM | POA: Diagnosis not present

## 2020-01-24 ENCOUNTER — Encounter (INDEPENDENT_AMBULATORY_CARE_PROVIDER_SITE_OTHER): Payer: Self-pay | Admitting: Bariatrics

## 2020-01-24 NOTE — Progress Notes (Signed)
Chief Complaint:   OBESITY Erin Warren is here to discuss her progress with her obesity treatment plan along with follow-up of her obesity related diagnoses. Erin Warren is on keeping a food journal and adhering to recommended goals of 1350 calories and 80-85 grams of protein and states she is following her eating plan approximately 50% of the time. Erin Warren states she is walking for 20 minutes 3 times per week.  Today's visit was #: 9 Starting weight: 240 lbs Starting date: 06/28/2018 Today's weight: 240 lbs Today's date: 01/22/2020 Total lbs lost to date: 0 Total lbs lost since last in-office visit: 0  Interim History: Erin Warren is up 2 pounds.  She has had more sugar intake.  She is doing well with her water consumption.  Subjective:   1. Insulin resistance Erin Warren has a diagnosis of insulin resistance based on her elevated fasting insulin level >5. She continues to work on diet and exercise to decrease her risk of diabetes.  She denies hunger or thirst.  She has a history of PCOS.  Lab Results  Component Value Date   INSULIN 16.5 12/13/2019   INSULIN 20.4 11/14/2018   INSULIN 14.2 06/28/2018   Lab Results  Component Value Date   HGBA1C 5.2 12/13/2019   2. Other specified hypothyroidism Erin Warren is taking Synthroid 25 mcg daily.  Lab Results  Component Value Date   TSH 2.580 12/13/2019   Assessment/Plan:   1. Insulin resistance Erin Warren will continue to work on weight loss, exercise, and decreasing simple carbohydrates to help decrease the risk of diabetes. Erin Warren agreed to follow-up with [name] as directed to closely monitor her progress.  She should decrease her carbohydrates and increase healthy fats and protein.  2. Other specified hypothyroidism Patient with long-standing hypothyroidism, on levothyroxine therapy. She appears euthyroid. Orders and follow up as documented in patient record.  Counseling . Good thyroid control is important for overall health. Supratherapeutic thyroid levels  are dangerous and will not improve weight loss results. . The correct way to take levothyroxine is fasting, with water, separated by at least 30 minutes from breakfast, and separated by more than 4 hours from calcium, iron, multivitamins, acid reflux medications (PPIs).   3. Class 2 obesity due to excess calories without serious comorbidity with body mass index (BMI) of 39.0 to 39.9 in adult Erin Warren is currently in the action stage of change. As such, her goal is to continue with weight loss efforts. She has agreed to keeping a food journal and adhering to recommended goals of 1350 calories and 80-85 grams of protein.   She will work on meal planning, intentional eating, journaling and increasing her protein intake and decreasing her sugar consumption.  Exercise goals: Will continue to walk.  Behavioral modification strategies: increasing lean protein intake, decreasing simple carbohydrates, increasing vegetables, increasing water intake, decreasing eating out, no skipping meals, meal planning and cooking strategies, keeping healthy foods in the home and planning for success.  Erin Warren has agreed to follow-up with our clinic in 2-3 weeks. She was informed of the importance of frequent follow-up visits to maximize her success with intensive lifestyle modifications for her multiple health conditions.   Objective:   Blood pressure (!) 142/99, pulse 90, temperature 98.6 F (37 C), height 5\' 5"  (1.651 m), weight 240 lb (108.9 kg), last menstrual period 12/26/2019, SpO2 96 %. Body mass index is 39.94 kg/m.  General: Cooperative, alert, well developed, in no acute distress. HEENT: Conjunctivae and lids unremarkable. Cardiovascular: Regular rhythm.  Lungs: Normal  work of breathing. Neurologic: No focal deficits.   Lab Results  Component Value Date   CREATININE 0.83 12/13/2019   BUN 17 12/13/2019   NA 139 12/13/2019   K 4.4 12/13/2019   CL 102 12/13/2019   CO2 20 12/13/2019   Lab Results   Component Value Date   ALT 20 12/13/2019   AST 19 12/13/2019   ALKPHOS 105 12/13/2019   BILITOT <0.2 12/13/2019   Lab Results  Component Value Date   HGBA1C 5.2 12/13/2019   HGBA1C 5.1 11/14/2018   HGBA1C 5.2 06/28/2018   HGBA1C 4.7 09/27/2013   Lab Results  Component Value Date   INSULIN 16.5 12/13/2019   INSULIN 20.4 11/14/2018   INSULIN 14.2 06/28/2018   Lab Results  Component Value Date   TSH 2.580 12/13/2019   Lab Results  Component Value Date   CHOL 194 12/13/2019   HDL 67 12/13/2019   LDLCALC 105 (H) 12/13/2019   TRIG 123 12/13/2019   CHOLHDL 3.1 10/18/2017   Lab Results  Component Value Date   WBC 11.6 (H) 06/28/2018   HGB 14.8 06/28/2018   HCT 43.8 06/28/2018   MCV 90 06/28/2018   PLT 375.0 02/03/2018   Attestation Statements:   Reviewed by clinician on day of visit: allergies, medications, problem list, medical history, surgical history, family history, social history, and previous encounter notes.  Time spent on visit including pre-visit chart review and post-visit care and charting was 20 minutes.   I, Water quality scientist, CMA, am acting as Location manager for [employer], DO.  I have reviewed the above documentation for accuracy and completeness, and I agree with the above. Jearld Lesch, DO

## 2020-02-19 ENCOUNTER — Ambulatory Visit (INDEPENDENT_AMBULATORY_CARE_PROVIDER_SITE_OTHER): Payer: 59 | Admitting: Bariatrics

## 2020-02-19 ENCOUNTER — Other Ambulatory Visit: Payer: Self-pay

## 2020-02-19 ENCOUNTER — Encounter (INDEPENDENT_AMBULATORY_CARE_PROVIDER_SITE_OTHER): Payer: Self-pay | Admitting: Bariatrics

## 2020-02-19 VITALS — BP 136/94 | HR 85 | Temp 98.3°F | Ht 65.0 in | Wt 243.0 lb

## 2020-02-19 DIAGNOSIS — E038 Other specified hypothyroidism: Secondary | ICD-10-CM | POA: Diagnosis not present

## 2020-02-19 DIAGNOSIS — E8881 Metabolic syndrome: Secondary | ICD-10-CM | POA: Diagnosis not present

## 2020-02-19 DIAGNOSIS — Z6841 Body Mass Index (BMI) 40.0 and over, adult: Secondary | ICD-10-CM

## 2020-02-20 ENCOUNTER — Encounter (INDEPENDENT_AMBULATORY_CARE_PROVIDER_SITE_OTHER): Payer: Self-pay | Admitting: Bariatrics

## 2020-02-20 NOTE — Progress Notes (Signed)
Chief Complaint:   OBESITY Erin Warren is here to discuss her progress with her obesity treatment plan along with follow-up of her obesity related diagnoses. Erin Warren is on keeping a food journal and adhering to recommended goals of 1350 calories and 80-85 grams of protein and states she is following her eating plan approximately 40% of the time. Erin Warren states she is walking for 20 minutes 2 times per week.  Today's visit was #: 25 Starting weight: 240 lbs Starting date: 06/28/2018 Today's weight: 243 lbs Today's date: 02/19/2020 Total lbs lost to date: 0 Total lbs lost since last in-office visit: 0  Interim History: Erin Warren is up 3 pounds from her last visit, but she is up almost 5 pounds in water weight per bioimpedence scale.  She has not been exercising as much.  Subjective:   1. Insulin resistance Clydean has a diagnosis of insulin resistance based on her elevated fasting insulin level >5. She continues to work on diet and exercise to decrease her risk of diabetes.  Lab Results  Component Value Date   INSULIN 16.5 12/13/2019   INSULIN 20.4 11/14/2018   INSULIN 14.2 06/28/2018   Lab Results  Component Value Date   HGBA1C 5.2 12/13/2019   2. Other specified hypothyroidism Erin Warren is taking Synthroid.   Lab Results  Component Value Date   TSH 2.580 12/13/2019   Assessment/Plan:   1. Insulin resistance Erin Warren will continue to work on weight loss, exercise, and decreasing simple carbohydrates to help decrease the risk of diabetes. Erin Warren agreed to follow-up with [name] as directed to closely monitor her progress.  Decrease carbohydrates and increase protein and healthy fats.  2. Other specified hypothyroidism Patient with long-standing hypothyroidism, on levothyroxine therapy. She appears euthyroid. Orders and follow up as documented in patient record.  Continue Synthroid.  Counseling . Good thyroid control is important for overall health. Supratherapeutic thyroid levels are dangerous  and will not improve weight loss results. . The correct way to take levothyroxine is fasting, with water, separated by at least 30 minutes from breakfast, and separated by more than 4 hours from calcium, iron, multivitamins, acid reflux medications (PPIs).   3. Class 3 severe obesity with serious comorbidity and body mass index (BMI) of 40.0 to 44.9 in adult, unspecified obesity type [facility]) Erin Warren is currently in the action stage of change. As such, her goal is to continue with weight loss efforts. She has agreed to keeping a food journal and adhering to recommended goals of 1350 calories and 80-85 grams of protein.   Exercise goals: She will get back to exercise.  Behavioral modification strategies: increasing lean protein intake, decreasing simple carbohydrates, increasing vegetables, increasing water intake, decreasing eating out, no skipping meals, meal planning and cooking strategies, keeping healthy foods in the home, dealing with family or coworker sabotage, travel eating strategies, holiday eating strategies  and celebration eating strategies.  Erin Warren will work on meal planning and intentional eating.  We discussed healthy snacks.  She says she will eat out less.  Erin Warren has agreed to follow-up with our clinic in 2 weeks. She was informed of the importance of frequent follow-up visits to maximize her success with intensive lifestyle modifications for her multiple health conditions.   Objective:   Blood pressure (!) 136/94, pulse 85, temperature 98.3 F (36.8 C), height 5\' 5"  (1.651 m), weight 243 lb (110.2 kg), last menstrual period 02/17/2020, SpO2 97 %. Body mass index is 40.44 kg/m.  General: Cooperative, alert, well developed, in no  acute distress. HEENT: Conjunctivae and lids unremarkable. Cardiovascular: Regular rhythm.  Lungs: Normal work of breathing. Neurologic: No focal deficits.   Lab Results  Component Value Date   CREATININE 0.83 12/13/2019   BUN 17 12/13/2019   NA 139  12/13/2019   K 4.4 12/13/2019   CL 102 12/13/2019   CO2 20 12/13/2019   Lab Results  Component Value Date   ALT 20 12/13/2019   AST 19 12/13/2019   ALKPHOS 105 12/13/2019   BILITOT <0.2 12/13/2019   Lab Results  Component Value Date   HGBA1C 5.2 12/13/2019   HGBA1C 5.1 11/14/2018   HGBA1C 5.2 06/28/2018   HGBA1C 4.7 09/27/2013   Lab Results  Component Value Date   INSULIN 16.5 12/13/2019   INSULIN 20.4 11/14/2018   INSULIN 14.2 06/28/2018   Lab Results  Component Value Date   TSH 2.580 12/13/2019   Lab Results  Component Value Date   CHOL 194 12/13/2019   HDL 67 12/13/2019   LDLCALC 105 (H) 12/13/2019   TRIG 123 12/13/2019   CHOLHDL 3.1 10/18/2017   Lab Results  Component Value Date   WBC 11.6 (H) 06/28/2018   HGB 14.8 06/28/2018   HCT 43.8 06/28/2018   MCV 90 06/28/2018   PLT 375.0 02/03/2018   Attestation Statements:   Reviewed by clinician on day of visit: allergies, medications, problem list, medical history, surgical history, family history, social history, and previous encounter notes.  Time spent on visit including pre-visit chart review and post-visit care and charting was 20 minutes.   I, Water quality scientist, CMA, am acting as Location manager for [employer], DO   I have reviewed the above documentation for accuracy and completeness, and I agree with the above. Jearld Lesch, DO

## 2020-03-04 ENCOUNTER — Ambulatory Visit (INDEPENDENT_AMBULATORY_CARE_PROVIDER_SITE_OTHER): Payer: 59 | Admitting: Bariatrics

## 2020-03-04 ENCOUNTER — Encounter (INDEPENDENT_AMBULATORY_CARE_PROVIDER_SITE_OTHER): Payer: Self-pay | Admitting: Bariatrics

## 2020-03-04 ENCOUNTER — Other Ambulatory Visit: Payer: Self-pay

## 2020-03-04 VITALS — BP 147/97 | HR 95 | Temp 98.2°F | Ht 65.0 in | Wt 244.0 lb

## 2020-03-04 DIAGNOSIS — E038 Other specified hypothyroidism: Secondary | ICD-10-CM

## 2020-03-04 DIAGNOSIS — Z6841 Body Mass Index (BMI) 40.0 and over, adult: Secondary | ICD-10-CM | POA: Diagnosis not present

## 2020-03-04 DIAGNOSIS — E8881 Metabolic syndrome: Secondary | ICD-10-CM

## 2020-03-05 ENCOUNTER — Encounter (INDEPENDENT_AMBULATORY_CARE_PROVIDER_SITE_OTHER): Payer: Self-pay | Admitting: Bariatrics

## 2020-03-05 NOTE — Progress Notes (Signed)
Chief Complaint:   Erin Warren is here to discuss her progress with her Erin treatment plan along with follow-up of her Erin related diagnoses. Erin Warren is keeping a food journal and adhering to recommended goals of 1350 calories and 80-85 grams of protein and states she is following her eating plan approximately 50% of the time. Erin Warren states she is walking 20 minutes 3 times per week.  Today's visit was #: 13 Starting weight: 240 lbs Starting date: 06/28/2018 Today's weight: 244 lbs Today's date: 03/04/2020 Total lbs lost to date: 0 Total lbs lost since last in-office visit: 0  Interim History: Erin Warren is up 1 lb. She continues to eat out more than she would like. She states she is staying well hydrated.  Subjective:   Other specified hypothyroidism. Thyroid levels are controlled. Erin Warren is taking Synthroid.   Lab Results  Component Value Date   TSH 2.580 12/13/2019   Insulin resistance. Erin Warren has a diagnosis of insulin resistance based on her elevated fasting insulin level >5. She continues to work on diet and exercise to decrease her risk of diabetes. Erin Warren is taking metformin.  Lab Results  Component Value Date   INSULIN 16.5 12/13/2019   INSULIN 20.4 11/14/2018   INSULIN 14.2 06/28/2018   Lab Results  Component Value Date   HGBA1C 5.2 12/13/2019   Assessment/Plan:   Other specified hypothyroidism. Patient with long-standing hypothyroidism, on levothyroxine therapy. She appears euthyroid. Orders and follow up as documented in patient record. Erin Warren will continue Synthroid as directed.  Counseling . Good thyroid control is important for overall health. Supratherapeutic thyroid levels are dangerous and will not improve weight loss results. . The correct way to take levothyroxine is fasting, with water, separated by at least 30 minutes from breakfast, and separated by more than 4 hours from calcium, iron, multivitamins, acid reflux medications (PPIs).    Insulin resistance. Erin Warren will continue to work on weight loss, exercise, and decreasing simple carbohydrates to help decrease the risk of diabetes. Erin Warren agreed to follow-up with [name] as directed to closely monitor her progress. She will continue metformin as directed.  Class 3 severe Erin with serious comorbidity and body mass index (BMI) of 40.0 to 44.9 in adult, unspecified Erin type ([place]).  Erin Warren is currently in the action stage of change. As such, her goal is to continue with weight loss efforts. She has agreed to keeping a food journal and adhering to recommended goals of 1350 calories and 80-85 grams of protein.   Exercise goals: Erin Warren will consider rejoining [employer].  Behavioral modification strategies: increasing lean protein intake, decreasing simple carbohydrates, increasing vegetables, increasing water intake, decreasing eating out, no skipping meals, meal planning and cooking strategies, keeping healthy foods in the home and planning for success.  Erin Warren has agreed to follow-up with our clinic in 2-3 weeks. She was informed of the importance of frequent follow-up visits to maximize her success with intensive lifestyle modifications for her multiple health conditions.   Objective:   Blood pressure (!) 147/97, pulse 95, temperature 98.2 F (36.8 C), height 5\' 5"  (1.651 m), weight 244 lb (110.7 kg), last menstrual period 02/17/2020, SpO2 94 %. Body mass index is 40.6 kg/m.  General: Cooperative, alert, well developed, in no acute distress. HEENT: Conjunctivae and lids unremarkable. Cardiovascular: Regular rhythm.  Lungs: Normal work of breathing. Neurologic: No focal deficits.   Lab Results  Component Value Date   CREATININE 0.83 12/13/2019   BUN 17 12/13/2019  NA 139 12/13/2019   K 4.4 12/13/2019   CL 102 12/13/2019   CO2 20 12/13/2019   Lab Results  Component Value Date   ALT 20 12/13/2019   AST 19 12/13/2019   ALKPHOS 105 12/13/2019   BILITOT <0.2  12/13/2019   Lab Results  Component Value Date   HGBA1C 5.2 12/13/2019   HGBA1C 5.1 11/14/2018   HGBA1C 5.2 06/28/2018   HGBA1C 4.7 09/27/2013   Lab Results  Component Value Date   INSULIN 16.5 12/13/2019   INSULIN 20.4 11/14/2018   INSULIN 14.2 06/28/2018   Lab Results  Component Value Date   TSH 2.580 12/13/2019   Lab Results  Component Value Date   CHOL 194 12/13/2019   HDL 67 12/13/2019   LDLCALC 105 (H) 12/13/2019   TRIG 123 12/13/2019   CHOLHDL 3.1 10/18/2017   Lab Results  Component Value Date   WBC 11.6 (H) 06/28/2018   HGB 14.8 06/28/2018   HCT 43.8 06/28/2018   MCV 90 06/28/2018   PLT 375.0 02/03/2018   No results found for: IRON, TIBC, FERRITIN  Attestation Statements:   Reviewed by clinician on day of visit: allergies, medications, problem list, medical history, surgical history, family history, social history, and previous encounter notes.  Time spent on visit including pre-visit chart review and post-visit charting and care was 20 minutes.   Erin Warren, am acting as Location manager for [employer], DO   I have reviewed the above documentation for accuracy and completeness, and I agree with the above. Jearld Lesch, DO

## 2020-03-25 ENCOUNTER — Encounter (INDEPENDENT_AMBULATORY_CARE_PROVIDER_SITE_OTHER): Payer: Self-pay | Admitting: Bariatrics

## 2020-03-25 ENCOUNTER — Ambulatory Visit (INDEPENDENT_AMBULATORY_CARE_PROVIDER_SITE_OTHER): Payer: 59 | Admitting: Bariatrics

## 2020-03-25 ENCOUNTER — Other Ambulatory Visit: Payer: Self-pay

## 2020-03-25 VITALS — BP 142/90 | HR 91 | Temp 97.8°F | Ht 65.0 in | Wt 243.0 lb

## 2020-03-25 DIAGNOSIS — R030 Elevated blood-pressure reading, without diagnosis of hypertension: Secondary | ICD-10-CM | POA: Diagnosis not present

## 2020-03-25 DIAGNOSIS — E8881 Metabolic syndrome: Secondary | ICD-10-CM | POA: Diagnosis not present

## 2020-03-25 DIAGNOSIS — Z6841 Body Mass Index (BMI) 40.0 and over, adult: Secondary | ICD-10-CM | POA: Diagnosis not present

## 2020-03-26 ENCOUNTER — Encounter (INDEPENDENT_AMBULATORY_CARE_PROVIDER_SITE_OTHER): Payer: Self-pay | Admitting: Bariatrics

## 2020-03-26 NOTE — Progress Notes (Signed)
Chief Complaint:   OBESITY DAKIYA PUOPOLO is here to discuss her progress with her obesity treatment plan along with follow-up of her obesity related diagnoses. Ofelia is keeping a food journal and adhering to recommended goals of 1350 calories and 80-85 grams of protein and states she is following her eating plan approximately 40% of the time. Ezmeralda states she is walking 20 minutes 3 times per week.  Today's visit was #: 43 Starting weight: 240 lbs Starting date: 06/28/2018 Today's weight: 243 lbs Today's date: 03/25/2020 Total lbs lost to date: 0 Total lbs lost since last in-office visit: 1  Interim History: Alexiss is down 1 lb. She went to the beach last week. She notes she could improve with her protein intake.  Subjective:   Elevated blood pressure reading without diagnosis of hypertension. Renatha denies headaches. She states blood pressure is up with stress, increased pain, and migraines.  BP Readings from Last 3 Encounters:  03/25/20 (!) 142/90  03/04/20 (!) 147/97  02/19/20 (!) 136/94   Insulin resistance. Alicea has a diagnosis of insulin resistance based on her elevated fasting insulin level >5. She continues to work on diet and exercise to decrease her risk of diabetes. No polyphagia.  Lab Results  Component Value Date   INSULIN 16.5 12/13/2019   INSULIN 20.4 11/14/2018   INSULIN 14.2 06/28/2018   Lab Results  Component Value Date   HGBA1C 5.2 12/13/2019   Assessment/Plan:   Elevated blood pressure reading without diagnosis of hypertension. Will watch over time. Will keep a check on blood pressure.  Insulin resistance. Rudean will continue to work on weight loss, exercise, and decreasing simple carbohydrates to help decrease the risk of diabetes. Sharissa agreed to follow-up with [name] as directed to closely monitor her progress.  Class 3 severe obesity with serious comorbidity and body mass index (BMI) of 40.0 to 44.9 in adult, unspecified obesity type ([place]).  Demica  is currently in the action stage of change. As such, her goal is to continue with weight loss efforts. She has agreed to keeping a food journal and adhering to recommended goals of 1350 calories and 80-85 grams of protein.   She will work on meal planning, intentional eating, and increasing her protein intake. She will continue to journal.  Exercise goals: Dajanae will continue to walk in the afternoon 30 minutes 5 days a week.  Behavioral modification strategies: increasing lean protein intake, decreasing simple carbohydrates, increasing vegetables, increasing water intake, decreasing eating out, no skipping meals, meal planning and cooking strategies, keeping healthy foods in the home and planning for success.  Goldye has agreed to follow-up with our clinic in 2-3 weeks. She was informed of the importance of frequent follow-up visits to maximize her success with intensive lifestyle modifications for her multiple health conditions.   Objective:   Blood pressure (!) 142/90, pulse 91, temperature 97.8 F (36.6 C), height 5\' 5"  (1.651 m), weight 243 lb (110.2 kg), SpO2 (!) 9 %. Body mass index is 40.44 kg/m.  General: Cooperative, alert, well developed, in no acute distress. HEENT: Conjunctivae and lids unremarkable. Cardiovascular: Regular rhythm.  Lungs: Normal work of breathing. Neurologic: No focal deficits.   Lab Results  Component Value Date   CREATININE 0.83 12/13/2019   BUN 17 12/13/2019   NA 139 12/13/2019   K 4.4 12/13/2019   CL 102 12/13/2019   CO2 20 12/13/2019   Lab Results  Component Value Date   ALT 20 12/13/2019   AST  19 12/13/2019   ALKPHOS 105 12/13/2019   BILITOT <0.2 12/13/2019   Lab Results  Component Value Date   HGBA1C 5.2 12/13/2019   HGBA1C 5.1 11/14/2018   HGBA1C 5.2 06/28/2018   HGBA1C 4.7 09/27/2013   Lab Results  Component Value Date   INSULIN 16.5 12/13/2019   INSULIN 20.4 11/14/2018   INSULIN 14.2 06/28/2018   Lab Results  Component Value  Date   TSH 2.580 12/13/2019   Lab Results  Component Value Date   CHOL 194 12/13/2019   HDL 67 12/13/2019   LDLCALC 105 (H) 12/13/2019   TRIG 123 12/13/2019   CHOLHDL 3.1 10/18/2017   Lab Results  Component Value Date   WBC 11.6 (H) 06/28/2018   HGB 14.8 06/28/2018   HCT 43.8 06/28/2018   MCV 90 06/28/2018   PLT 375.0 02/03/2018   No results found for: IRON, TIBC, FERRITIN  Attestation Statements:   Reviewed by clinician on day of visit: allergies, medications, problem list, medical history, surgical history, family history, social history, and previous encounter notes.  Time spent on visit including pre-visit chart review and post-visit charting and care was 20 minutes.   Migdalia Dk, am acting as Location manager for [employer], DO   I have reviewed the above documentation for accuracy and completeness, and I agree with the above. Jearld Lesch, DO

## 2020-04-10 ENCOUNTER — Encounter (INDEPENDENT_AMBULATORY_CARE_PROVIDER_SITE_OTHER): Payer: Self-pay | Admitting: Bariatrics

## 2020-04-10 ENCOUNTER — Ambulatory Visit (INDEPENDENT_AMBULATORY_CARE_PROVIDER_SITE_OTHER): Payer: 59 | Admitting: Bariatrics

## 2020-04-10 ENCOUNTER — Other Ambulatory Visit: Payer: Self-pay

## 2020-04-10 VITALS — BP 140/88 | HR 90 | Temp 97.6°F | Ht 65.0 in | Wt 245.0 lb

## 2020-04-10 DIAGNOSIS — E038 Other specified hypothyroidism: Secondary | ICD-10-CM

## 2020-04-10 DIAGNOSIS — E8881 Metabolic syndrome: Secondary | ICD-10-CM | POA: Diagnosis not present

## 2020-04-10 DIAGNOSIS — Z6841 Body Mass Index (BMI) 40.0 and over, adult: Secondary | ICD-10-CM

## 2020-04-14 ENCOUNTER — Encounter (INDEPENDENT_AMBULATORY_CARE_PROVIDER_SITE_OTHER): Payer: Self-pay | Admitting: Bariatrics

## 2020-04-14 NOTE — Progress Notes (Signed)
Chief Complaint:   OBESITY Erin Warren is here to discuss her progress with her obesity treatment plan along with follow-up of her obesity related diagnoses. Erin Warren is keeping a food journal and adhering to recommended goals of 1350 calories and 80-85 grams of protein and states she is following her eating plan approximately 60% of the time. Erin Warren states she is walking 20-30 minutes 4 times per week.  Today's visit was #: 12 Starting weight: 240 lbs Starting date: 06/28/2018 Today's weight: 245 lbs Today's date: 04/10/2020 Total lbs lost to date: 0 Total lbs lost since last in-office visit: 0  Interim History: [name] is up 2 lbs. She states it has been stressful at work. She reports doing okay with her water and protein intake. She is up about 4.5 lbs of water.  Subjective:   Insulin resistance. [name] has a diagnosis of insulin resistance based on her elevated fasting insulin level >5. She continues to work on diet and exercise to decrease her risk of diabetes. [name] is on no medication.   Lab Results  Component Value Date   INSULIN 16.5 12/13/2019   INSULIN 20.4 11/14/2018   INSULIN 14.2 06/28/2018   Lab Results  Component Value Date   HGBA1C 5.2 12/13/2019   Other specified hypothyroidism. Erin Warren is taking Synthroid and denies excessive fatigue.   Lab Results  Component Value Date   TSH 2.580 12/13/2019   Assessment/Plan:   Insulin resistance. [name] will continue to work on weight loss, increasing activities, and decreasing simple carbohydrates to help decrease the risk of diabetes. [name] agreed to follow-up with [name] as directed to closely monitor her progress.  Other specified hypothyroidism. Patient with long-standing hypothyroidism, on levothyroxine therapy. She appears euthyroid. Orders and follow up as documented in patient record. [name] will continue her medication as directed.   Counseling . Good thyroid control is important for overall health. Supratherapeutic  thyroid levels are dangerous and will not improve weight loss results.  . The correct way to take levothyroxine is fasting, with water, separated by at least 30 minutes from breakfast, and separated by more than 4 hours from calcium, iron, multivitamins, acid reflux medications (PPIs).   Class 3 severe obesity with serious comorbidity and body mass index (BMI) of 40.0 to 44.9 in adult, unspecified obesity type (Erin Warren).  [name] is currently in the action stage of change. As such, her goal is to continue with weight loss efforts. She has agreed to keeping a food journal and adhering to recommended goals of 1350 calories and 80-85 grams of protein.   She will work on meal planning, intentional eating, and continuing to decrease  carbs and sweets in her diet.  Exercise goals: Erin Warren will continue walking 20-30 minutes 6 times per week.  Behavioral modification strategies: increasing lean protein intake, decreasing simple carbohydrates, increasing vegetables, increasing water intake, decreasing eating out, no skipping meals, meal planning and cooking strategies, keeping healthy foods in the home and planning for success.  [name] has agreed to follow-up with our clinic in 2-3 weeks. She was informed of the importance of frequent follow-up visits to maximize her success with intensive lifestyle modifications for her multiple health conditions.   Objective:   Blood pressure 140/88, pulse 90, temperature 97.6 F (36.4 C), height 5\' 5"  (1.651 m), weight 245 lb (111.1 kg), last menstrual period 04/09/2020, SpO2 99 %. Body mass index is 40.77 kg/m.  General: Cooperative, alert, well developed, in no acute distress. HEENT: Conjunctivae and lids unremarkable. Cardiovascular:  Regular rhythm.  Lungs: Normal work of breathing. Neurologic: No focal deficits.   Lab Results  Component Value Date   CREATININE 0.83 12/13/2019   BUN 17 12/13/2019   NA 139 12/13/2019   K 4.4 12/13/2019   CL 102 12/13/2019    CO2 20 12/13/2019   Lab Results  Component Value Date   ALT 20 12/13/2019   AST 19 12/13/2019   ALKPHOS 105 12/13/2019   BILITOT <0.2 12/13/2019   Lab Results  Component Value Date   HGBA1C 5.2 12/13/2019   HGBA1C 5.1 11/14/2018   HGBA1C 5.2 06/28/2018   HGBA1C 4.7 09/27/2013   Lab Results  Component Value Date   INSULIN 16.5 12/13/2019   INSULIN 20.4 11/14/2018   INSULIN 14.2 06/28/2018   Lab Results  Component Value Date   TSH 2.580 12/13/2019   Lab Results  Component Value Date   CHOL 194 12/13/2019   HDL 67 12/13/2019   LDLCALC 105 (H) 12/13/2019   TRIG 123 12/13/2019   CHOLHDL 3.1 10/18/2017   Lab Results  Component Value Date   WBC 11.6 (H) 06/28/2018   HGB 14.8 06/28/2018   HCT 43.8 06/28/2018   MCV 90 06/28/2018   PLT 375.0 02/03/2018   No results found for: IRON, TIBC, FERRITIN  Attestation Statements:   Reviewed by clinician on day of visit: allergies, medications, problem list, medical history, surgical history, family history, social history, and previous encounter notes.  Time spent on visit including pre-visit chart review and post-visit charting and care was 20 minutes.   Migdalia Dk, am acting as Location manager for [employer], DO   I have reviewed the above documentation for accuracy and completeness, and I agree with the above. Jearld Lesch, DO

## 2020-05-05 ENCOUNTER — Ambulatory Visit (INDEPENDENT_AMBULATORY_CARE_PROVIDER_SITE_OTHER): Payer: 59 | Admitting: Bariatrics

## 2020-05-05 ENCOUNTER — Encounter (INDEPENDENT_AMBULATORY_CARE_PROVIDER_SITE_OTHER): Payer: Self-pay

## 2020-06-16 ENCOUNTER — Ambulatory Visit (INDEPENDENT_AMBULATORY_CARE_PROVIDER_SITE_OTHER): Payer: 59 | Admitting: Family Medicine

## 2020-06-16 ENCOUNTER — Encounter (INDEPENDENT_AMBULATORY_CARE_PROVIDER_SITE_OTHER): Payer: Self-pay | Admitting: Family Medicine

## 2020-06-16 ENCOUNTER — Other Ambulatory Visit: Payer: Self-pay

## 2020-06-16 VITALS — BP 153/106 | HR 102 | Temp 99.0°F | Ht 65.0 in | Wt 244.0 lb

## 2020-06-16 DIAGNOSIS — Z6841 Body Mass Index (BMI) 40.0 and over, adult: Secondary | ICD-10-CM

## 2020-06-16 DIAGNOSIS — R030 Elevated blood-pressure reading, without diagnosis of hypertension: Secondary | ICD-10-CM

## 2020-06-16 DIAGNOSIS — G43109 Migraine with aura, not intractable, without status migrainosus: Secondary | ICD-10-CM | POA: Diagnosis not present

## 2020-06-18 NOTE — Progress Notes (Signed)
Chief Complaint:   OBESITY Erin Warren is here to discuss her progress with her obesity treatment plan along with follow-up of her obesity related diagnoses. Erin Warren is on the Category 2 Plan and states she is following her eating plan approximately 50% of the time. Erin Warren states she is exercising for 0 minutes 0 times per week.  Today's visit was #: 31 Starting weight: 241 Starting date: 06/28/2018 Today's weight: 244 lbs Today's date: 06/16/2020 Total lbs lost to date: +3 lbs Total lbs lost since last in-office visit: 1 lb  Interim History: Erin Warren has not been exercising for the past 2 weeks.  She has not been seen since 04/10/2020.  This is her first office visit with me.  She saw Dr. Owens Shark prior.  She was told to follow-up in 2-3 weeks.  She says that she is eating off plan 50% of the time.  Assessment/Plan:   1. Elevated blood pressure reading without diagnosis of hypertension At last office visit, blood pressure was 140/88, and at prior office visits, 140s/90s.  In the past, she was on an ACE inhibitor that caused a cough.  Plan:  Check blood pressure/heart rate at home every other day and keep a log and bring in to next office visit.  Follow-up with PCP if it remains above goal of 120/80 or less (she has an appointment next month).  BP Readings from Last 3 Encounters:  06/16/20 (!) 153/106  04/10/20 140/88  03/25/20 (!) 142/90   2. Migraine with aura and without status migrainosus, not intractable Started on propranolol by Neurology for migraines a couple of years ago.  Plan:  Stable symptoms.  Denies medication side effects.  3. Class 3 severe obesity with serious comorbidity and body mass index (BMI) of 40.0 to 44.9 in adult, unspecified obesity type [facility])  Erin Warren is currently in the action stage of change. As such, her goal is to continue with weight loss efforts. She has agreed to the Category 2 Plan.   Exercise goals: All adults should avoid inactivity. Some physical  activity is better than none, and adults who participate in any amount of physical activity gain some health benefits.  Behavioral modification strategies: increasing lean protein intake, decreasing simple carbohydrates, meal planning and cooking strategies, keeping healthy foods in the home, dealing with family sabotage (significant other is in the program also, and she says that he eats poorly and gets her off track) and planning for success.  Erin Warren has agreed to follow-up with our clinic in 3 weeks with Dr. Owens Shark. She was informed of the importance of frequent follow-up visits to maximize her success with intensive lifestyle modifications for her multiple health conditions.   Objective:   Blood pressure (!) 153/106, pulse (!) 102, temperature 99 F (37.2 C), height 5\' 5"  (1.651 m), weight 244 lb (110.7 kg), SpO2 97 %. Body mass index is 40.6 kg/m.  General: Cooperative, alert, well developed, in no acute distress. HEENT: Conjunctivae and lids unremarkable. Cardiovascular: Regular rhythm.  Lungs: Normal work of breathing. Neurologic: No focal deficits.   Lab Results  Component Value Date   CREATININE 0.83 12/13/2019   BUN 17 12/13/2019   NA 139 12/13/2019   K 4.4 12/13/2019   CL 102 12/13/2019   CO2 20 12/13/2019   Lab Results  Component Value Date   ALT 20 12/13/2019   AST 19 12/13/2019   ALKPHOS 105 12/13/2019   BILITOT <0.2 12/13/2019   Lab Results  Component Value Date   HGBA1C  5.2 12/13/2019   HGBA1C 5.1 11/14/2018   HGBA1C 5.2 06/28/2018   HGBA1C 4.7 09/27/2013   Lab Results  Component Value Date   INSULIN 16.5 12/13/2019   INSULIN 20.4 11/14/2018   INSULIN 14.2 06/28/2018   Lab Results  Component Value Date   TSH 2.580 12/13/2019   Lab Results  Component Value Date   CHOL 194 12/13/2019   HDL 67 12/13/2019   LDLCALC 105 (H) 12/13/2019   TRIG 123 12/13/2019   CHOLHDL 3.1 10/18/2017   Lab Results  Component Value Date   WBC 11.6 (H) 06/28/2018    HGB 14.8 06/28/2018   HCT 43.8 06/28/2018   MCV 90 06/28/2018   PLT 375.0 02/03/2018   Attestation Statements:   Reviewed by clinician on day of visit: allergies, medications, problem list, medical history, surgical history, family history, social history, and previous encounter notes.  Time spent on visit including pre-visit chart review and post-visit care and charting was 20 minutes.   I, Water quality scientist, CMA, am acting as Location manager for [employer], DO.  I have reviewed the above documentation for accuracy and completeness, and I agree with the above. -  *Marjory Sneddon, D.O.  The [place] was signed into law in 2016 which includes the topic of electronic health records.  This provides immediate access to information in MyChart.  This includes consultation notes, operative notes, office notes, lab results and pathology reports.  If you have any questions about what you read please let us know at your next visit so we can discuss your concerns and take corrective action if need be.  We are right here with you.

## 2020-07-07 ENCOUNTER — Ambulatory Visit (INDEPENDENT_AMBULATORY_CARE_PROVIDER_SITE_OTHER): Payer: 59 | Admitting: Bariatrics

## 2020-07-07 ENCOUNTER — Other Ambulatory Visit: Payer: Self-pay

## 2020-07-07 ENCOUNTER — Encounter (INDEPENDENT_AMBULATORY_CARE_PROVIDER_SITE_OTHER): Payer: Self-pay | Admitting: Bariatrics

## 2020-07-07 VITALS — BP 77/52 | HR 93 | Temp 97.7°F | Ht 65.0 in | Wt 244.0 lb

## 2020-07-07 DIAGNOSIS — R0989 Other specified symptoms and signs involving the circulatory and respiratory systems: Secondary | ICD-10-CM | POA: Diagnosis not present

## 2020-07-07 DIAGNOSIS — Z6841 Body Mass Index (BMI) 40.0 and over, adult: Secondary | ICD-10-CM

## 2020-07-07 DIAGNOSIS — E038 Other specified hypothyroidism: Secondary | ICD-10-CM

## 2020-07-08 ENCOUNTER — Encounter (INDEPENDENT_AMBULATORY_CARE_PROVIDER_SITE_OTHER): Payer: Self-pay | Admitting: Bariatrics

## 2020-07-08 NOTE — Progress Notes (Signed)
Chief Complaint:   OBESITY Erin Warren is here to discuss her progress with her obesity treatment plan along with follow-up of her obesity related diagnoses. Erin Warren is on the Category 2 Plan and states she is following her eating plan approximately 50% of the time. Erin Warren states she is exercising 0 minutes 0 times per week.  Today's visit was #: 43 Starting weight: 240 lbs Starting date: 06/28/2018 Today's weight: 244 lbs Today's date: 07/07/2020 Total lbs lost to date: 0 Total lbs lost since last in-office visit: 0  Interim History: Erin Warren's weight remains the same since her last visit. She is packing her lunch. She states appetite is normal. She reports getting adequate water intake. She has been more stressed and not sleeping as well.  Subjective:   Labile blood pressure. Blood pressure was elevated at the time of her last office visit, however, is low today at 77/52. She is on no medication and denies dizziness. She has been on vacation.  Other specified hypothyroidism. Erin Warren is on synthroid and endorses fatigue.   Lab Results  Component Value Date   TSH 2.580 12/13/2019   Assessment/Plan:   Labile blood pressure. Will continue to watch her blood pressure.  Other specified hypothyroidism. Patient with long-standing hypothyroidism, on levothyroxine therapy. She appears euthyroid. Orders and follow up as documented in patient record. Erin Warren will continue synthroid as directed.   Counseling . Good thyroid control is important for overall health. Supratherapeutic thyroid levels are dangerous and will not improve weight loss results. . The correct way to take levothyroxine is fasting, with water, separated by at least 30 minutes from breakfast, and separated by more than 4 hours from calcium, iron, multivitamins, acid reflux medications (PPIs).   Class 3 severe obesity with serious comorbidity and body mass index (BMI) of 40.0 to 44.9 in adult, unspecified obesity type  (Erin Warren).  Erin Warren is currently in the action stage of change. As such, her goal is to continue with weight loss efforts. She has agreed to the [employer] and will journal 1200 calories and 85+ grams of protein.   She will work on meal planning, intentional eating, and increasing her exercise.  Exercise goals: Exercise is non-existent, but she will increase exercise.  Behavioral modification strategies: increasing lean protein intake, decreasing simple carbohydrates, increasing vegetables, increasing water intake, decreasing eating out, no skipping meals, meal planning and cooking strategies, keeping healthy foods in the home and planning for success.  Erin Warren has agreed to follow-up with our clinic fasting in 3-4 weeks. She was informed of the importance of frequent follow-up visits to maximize her success with intensive lifestyle modifications for her multiple health conditions.   Objective:   Blood pressure (!) 77/52, pulse 93, temperature 97.7 F (36.5 C), height 5\' 5"  (1.651 m), weight 244 lb (110.7 kg), last menstrual period 06/30/2020, SpO2 95 %. Body mass index is 40.6 kg/m.  General: Cooperative, alert, well developed, in no acute distress. HEENT: Conjunctivae and lids unremarkable. Cardiovascular: Regular rhythm.  Lungs: Normal work of breathing. Neurologic: No focal deficits.   Lab Results  Component Value Date   CREATININE 0.83 12/13/2019   BUN 17 12/13/2019   NA 139 12/13/2019   K 4.4 12/13/2019   CL 102 12/13/2019   CO2 20 12/13/2019   Lab Results  Component Value Date   ALT 20 12/13/2019   AST 19 12/13/2019   ALKPHOS 105 12/13/2019   BILITOT <0.2 12/13/2019   Lab Results  Component Value Date  HGBA1C 5.2 12/13/2019   HGBA1C 5.1 11/14/2018   HGBA1C 5.2 06/28/2018   HGBA1C 4.7 09/27/2013   Lab Results  Component Value Date   INSULIN 16.5 12/13/2019   INSULIN 20.4 11/14/2018   INSULIN 14.2 06/28/2018   Lab Results  Component Value Date   TSH 2.580  12/13/2019   Lab Results  Component Value Date   CHOL 194 12/13/2019   HDL 67 12/13/2019   LDLCALC 105 (H) 12/13/2019   TRIG 123 12/13/2019   CHOLHDL 3.1 10/18/2017   Lab Results  Component Value Date   WBC 11.6 (H) 06/28/2018   HGB 14.8 06/28/2018   HCT 43.8 06/28/2018   MCV 90 06/28/2018   PLT 375.0 02/03/2018   No results found for: IRON, TIBC, FERRITIN  Attestation Statements:   Reviewed by clinician on day of visit: allergies, medications, problem list, medical history, surgical history, family history, social history, and previous encounter notes.  Time spent on visit including pre-visit chart review and post-visit charting and care was 20 minutes.   Migdalia Dk, am acting as Location manager for [employer], DO   I have reviewed the above documentation for accuracy and completeness, and I agree with the above. Jearld Lesch, DO

## 2020-07-28 ENCOUNTER — Other Ambulatory Visit: Payer: Self-pay

## 2020-07-28 ENCOUNTER — Ambulatory Visit (INDEPENDENT_AMBULATORY_CARE_PROVIDER_SITE_OTHER): Payer: 59 | Admitting: Bariatrics

## 2020-07-28 ENCOUNTER — Encounter (INDEPENDENT_AMBULATORY_CARE_PROVIDER_SITE_OTHER): Payer: Self-pay | Admitting: Bariatrics

## 2020-07-28 VITALS — BP 134/85 | HR 86 | Temp 98.1°F | Ht 65.0 in | Wt 244.0 lb

## 2020-07-28 DIAGNOSIS — E038 Other specified hypothyroidism: Secondary | ICD-10-CM | POA: Diagnosis not present

## 2020-07-28 DIAGNOSIS — Z6841 Body Mass Index (BMI) 40.0 and over, adult: Secondary | ICD-10-CM | POA: Diagnosis not present

## 2020-07-28 DIAGNOSIS — E8881 Metabolic syndrome: Secondary | ICD-10-CM | POA: Diagnosis not present

## 2020-07-30 ENCOUNTER — Encounter (INDEPENDENT_AMBULATORY_CARE_PROVIDER_SITE_OTHER): Payer: Self-pay | Admitting: Bariatrics

## 2020-07-30 NOTE — Progress Notes (Signed)
Chief Complaint:   Erin Warren is here to discuss her progress with her Erin treatment plan along with follow-up of her Erin related diagnoses. Erin Warren is on the Category 2 Plan and states she is following her eating plan approximately 60% of the time. [name] states she is exercising 0 minutes 0 times per week.  Today's visit was #: 24 Starting weight: 240 lbs Starting date: 06/28/2018 Today's weight: 244 lbs Today's date: 07/28/2020 Total lbs lost to date: 0 Total lbs lost since last in-office visit: 0  Interim History: Erin Warren's weight remains the same. She reports doing okay with her water intake.  Subjective:   Insulin resistance. Erin Warren has a diagnosis of insulin resistance based on her elevated fasting insulin level >5. She continues to work on diet and exercise to decrease her risk of diabetes. Erin Warren is taking metformin.  Lab Results  Component Value Date   INSULIN 16.5 12/13/2019   INSULIN 20.4 11/14/2018   INSULIN 14.2 06/28/2018   Lab Results  Component Value Date   HGBA1C 5.2 12/13/2019   Other specified hypothyroidism. Erin Warren is taking Synthroid.   Lab Results  Component Value Date   TSH 2.580 12/13/2019   Assessment/Plan:   Insulin resistance. Erin Warren will continue to work on weight loss, exercise, and decreasing simple carbohydrates to help decrease the risk of diabetes. Erin Warren agreed to follow-up with [name] as directed to closely monitor her progress. She will continue metformin as directed.   Other specified hypothyroidism. Patient with long-standing hypothyroidism, on levothyroxine therapy. She appears euthyroid. Orders and follow up as documented in patient record. Erin Warren will continue Synthroid as directed.   Counseling . Good thyroid control is important for overall health. Supratherapeutic thyroid levels are dangerous and will not improve weight loss results. . The correct way to take levothyroxine is fasting, with water, separated by at least  30 minutes from breakfast, and separated by more than 4 hours from calcium, iron, multivitamins, acid reflux medications (PPIs).   Class 3 severe Erin with serious comorbidity and body mass index (BMI) of 40.0 to 44.9 in adult, unspecified Erin type (Erin Warren).  Erin Warren is currently in the action stage of change. As such, her goal is to continue with weight loss efforts. She has agreed to the Category 2 Plan alternating with the [place].   She will work on meal planning, intentional eating, increasing her water intake, and avoiding crazy stress baking.  Exercise goals: Danesha will schedule in her exercise.  Behavioral modification strategies: increasing lean protein intake, decreasing simple carbohydrates, increasing vegetables, increasing water intake, decreasing eating out, no skipping meals, meal planning and cooking strategies, keeping healthy foods in the home and planning for success.  Erin Warren has agreed to follow-up with our clinic in 3-4 weeks. She was informed of the importance of frequent follow-up visits to maximize her success with intensive lifestyle modifications for her multiple health conditions.   Objective:   Blood pressure 134/85, pulse 86, temperature 98.1 F (36.7 C), height 5\' 5"  (1.651 m), weight 244 lb (110.7 kg), last menstrual period 06/30/2020, SpO2 (!) 9 %. Body mass index is 40.6 kg/m.  General: Cooperative, alert, well developed, in no acute distress. HEENT: Conjunctivae and lids unremarkable. Cardiovascular: Regular rhythm.  Lungs: Normal work of breathing. Neurologic: No focal deficits.   Lab Results  Component Value Date   CREATININE 0.83 12/13/2019   BUN 17 12/13/2019   NA 139 12/13/2019   K 4.4 12/13/2019   CL 102 12/13/2019  CO2 20 12/13/2019   Lab Results  Component Value Date   ALT 20 12/13/2019   AST 19 12/13/2019   ALKPHOS 105 12/13/2019   BILITOT <0.2 12/13/2019   Lab Results  Component Value Date   HGBA1C 5.2 12/13/2019    HGBA1C 5.1 11/14/2018   HGBA1C 5.2 06/28/2018   HGBA1C 4.7 09/27/2013   Lab Results  Component Value Date   INSULIN 16.5 12/13/2019   INSULIN 20.4 11/14/2018   INSULIN 14.2 06/28/2018   Lab Results  Component Value Date   TSH 2.580 12/13/2019   Lab Results  Component Value Date   CHOL 194 12/13/2019   HDL 67 12/13/2019   LDLCALC 105 (H) 12/13/2019   TRIG 123 12/13/2019   CHOLHDL 3.1 10/18/2017   Lab Results  Component Value Date   WBC 11.6 (H) 06/28/2018   HGB 14.8 06/28/2018   HCT 43.8 06/28/2018   MCV 90 06/28/2018   PLT 375.0 02/03/2018   No results found for: IRON, TIBC, FERRITIN  Attestation Statements:   Reviewed by clinician on day of visit: allergies, medications, problem list, medical history, surgical history, family history, social history, and previous encounter notes.  Time spent on visit including pre-visit chart review and post-visit charting and care was 20 minutes.   Migdalia Dk, am acting as Location manager for [employer], DO   I have reviewed the above documentation for accuracy and completeness, and I agree with the above. Jearld Lesch, DO

## 2020-08-19 ENCOUNTER — Ambulatory Visit (INDEPENDENT_AMBULATORY_CARE_PROVIDER_SITE_OTHER): Payer: 59 | Admitting: Bariatrics

## 2020-09-15 ENCOUNTER — Other Ambulatory Visit: Payer: Self-pay

## 2020-09-15 ENCOUNTER — Encounter (INDEPENDENT_AMBULATORY_CARE_PROVIDER_SITE_OTHER): Payer: Self-pay | Admitting: Bariatrics

## 2020-09-15 ENCOUNTER — Ambulatory Visit (INDEPENDENT_AMBULATORY_CARE_PROVIDER_SITE_OTHER): Payer: 59 | Admitting: Bariatrics

## 2020-09-15 VITALS — BP 141/86 | Temp 98.2°F | Ht 65.0 in | Wt 247.0 lb

## 2020-09-15 DIAGNOSIS — E038 Other specified hypothyroidism: Secondary | ICD-10-CM

## 2020-09-15 DIAGNOSIS — K219 Gastro-esophageal reflux disease without esophagitis: Secondary | ICD-10-CM

## 2020-09-15 DIAGNOSIS — Z6841 Body Mass Index (BMI) 40.0 and over, adult: Secondary | ICD-10-CM | POA: Diagnosis not present

## 2020-09-17 NOTE — Progress Notes (Unsigned)
Chief Complaint:   OBESITY Erin Warren is here to discuss her progress with her obesity treatment plan along with follow-up of her obesity related diagnoses. Erin Warren is on the Category 2 Plan and states she is following her eating plan approximately 40% of the time. Erin Warren states she is walking for a few minutes 5 times per week.  Today's visit was #: 62 Starting weight: 240 lbs Starting date: 06/28/2018 Today's weight: 247 lbs Today's date: 09/15/2020 Total lbs lost to date: 0 Total lbs lost since last in-office visit: 0  Interim History: Erin Warren is up 3 pounds since her last visit on 07/29/2020.  She is working from home now since the new year.  She states that eating was challenging over the holiday.  Subjective:   1. Gastroesophageal reflux disease without esophagitis She has been experiencing acid reflux. She is taking Pepcid for her symptoms.  2. Other specified hypothyroidism Erin Warren is taking Synthroid 25 mcg daily.  Lab Results  Component Value Date   TSH 2.580 12/13/2019   Assessment/Plan:   1. Gastroesophageal reflux disease without esophagitis Intensive lifestyle modifications are the first line treatment for this issue. We discussed several lifestyle modifications today and she will continue to work on diet, exercise and weight loss efforts. She will continue taking Pepcid.  Counseling . If a person has gastroesophageal reflux disease (GERD), food and stomach acid move back up into the esophagus and cause symptoms or problems such as damage to the esophagus. . Anti-reflux measures include: raising the head of the bed, avoiding tight clothing or belts, avoiding eating late at night, not lying down shortly after mealtime, and achieving weight loss. . Avoid ASA, NSAID's, caffeine, alcohol, and tobacco.  . OTC Pepcid and/or Tums are often very helpful for as needed use.  [name] However, for persisting chronic or daily symptoms, stronger medications like Omeprazole may be  needed. . You may need to avoid foods and drinks such as: ? Coffee and tea (with or without caffeine). ? Drinks that contain alcohol. ? Energy drinks and sports drinks. ? Bubbly (carbonated) drinks or sodas. ? Chocolate and cocoa. ? Peppermint and mint flavorings. ? Garlic and onions. ? Horseradish. ? Spicy and acidic foods. These include peppers, chili powder, curry powder, vinegar, hot sauces, and BBQ sauce. ? Citrus fruit juices and citrus fruits, such as oranges, lemons, and limes. ? Tomato-based foods. These include red sauce, chili, salsa, and pizza with red sauce. ? Fried and fatty foods. These include donuts, french fries, potato chips, and high-fat dressings. ? High-fat meats. These include hot dogs, rib eye steak, sausage, ham, and bacon.  2. Other specified hypothyroidism Patient with long-standing hypothyroidism, on levothyroxine therapy. She appears euthyroid. Continue Synthroid.   Counseling . Good thyroid control is important for overall health. Supratherapeutic thyroid levels are dangerous and will not improve weight loss results. . The correct way to take levothyroxine is fasting, with water, separated by at least 30 minutes from breakfast, and separated by more than 4 hours from calcium, iron, multivitamins, acid reflux medications (PPIs).   3. Class 3 severe obesity with serious comorbidity and body mass index (BMI) of 40.0 to 44.9 in adult, unspecified obesity type [facility])  Erin Warren is currently in the action stage of change. As such, her goal is to continue with weight loss efforts. She has agreed to the Category 2 Plan and the Erin Warren.   She will work on meal planning, mindful eating, and will clean out the refrigerator and keep "  bad" things out of the house.  Exercise goals: Walking.  Behavioral modification strategies: increasing lean protein intake, decreasing simple carbohydrates, increasing vegetables, increasing water intake, decreasing eating out, no  skipping meals, meal planning and cooking strategies, keeping healthy foods in the home and planning for success.  Erin Warren has agreed to follow-up with our clinic in 2 weeks. She was informed of the importance of frequent follow-up visits to maximize her success with intensive lifestyle modifications for her multiple health conditions.   Objective:   Blood pressure (!) 141/86, temperature 98.2 F (36.8 C), temperature source Oral, height 5\' 5"  (1.651 m), weight 247 lb (112 kg), last menstrual period 09/15/2020, SpO2 98 %. Body mass index is 41.1 kg/m.  General: Cooperative, alert, well developed, in no acute distress. HEENT: Conjunctivae and lids unremarkable. Cardiovascular: Regular rhythm.  Lungs: Normal work of breathing. Neurologic: No focal deficits.   Lab Results  Component Value Date   CREATININE 0.83 12/13/2019   BUN 17 12/13/2019   NA 139 12/13/2019   K 4.4 12/13/2019   CL 102 12/13/2019   CO2 20 12/13/2019   Lab Results  Component Value Date   ALT 20 12/13/2019   AST 19 12/13/2019   ALKPHOS 105 12/13/2019   BILITOT <0.2 12/13/2019   Lab Results  Component Value Date   HGBA1C 5.2 12/13/2019   HGBA1C 5.1 11/14/2018   HGBA1C 5.2 06/28/2018   HGBA1C 4.7 09/27/2013   Lab Results  Component Value Date   INSULIN 16.5 12/13/2019   INSULIN 20.4 11/14/2018   INSULIN 14.2 06/28/2018   Lab Results  Component Value Date   TSH 2.580 12/13/2019   Lab Results  Component Value Date   CHOL 194 12/13/2019   HDL 67 12/13/2019   LDLCALC 105 (H) 12/13/2019   TRIG 123 12/13/2019   CHOLHDL 3.1 10/18/2017   Lab Results  Component Value Date   WBC 11.6 (H) 06/28/2018   HGB 14.8 06/28/2018   HCT 43.8 06/28/2018   MCV 90 06/28/2018   PLT 375.0 02/03/2018   Attestation Statements:   Reviewed by clinician on day of visit: allergies, medications, problem list, medical history, surgical history, family history, social history, and previous encounter notes.  Time spent on  visit including pre-visit chart review and post-visit care and charting was 20 minutes.   I, Water quality scientist, CMA, am acting as Location manager for [employer], DO  I have reviewed the above documentation for accuracy and completeness, and I agree with the above. Jearld Lesch, DO

## 2020-09-18 ENCOUNTER — Encounter (INDEPENDENT_AMBULATORY_CARE_PROVIDER_SITE_OTHER): Payer: Self-pay | Admitting: Bariatrics

## 2020-10-06 ENCOUNTER — Ambulatory Visit: Payer: 59 | Admitting: Diagnostic Neuroimaging

## 2020-10-06 ENCOUNTER — Encounter: Payer: Self-pay | Admitting: Diagnostic Neuroimaging

## 2020-10-06 VITALS — BP 128/80 | HR 81 | Ht 66.0 in | Wt 247.0 lb

## 2020-10-06 DIAGNOSIS — G43809 Other migraine, not intractable, without status migrainosus: Secondary | ICD-10-CM | POA: Diagnosis not present

## 2020-10-06 MED ORDER — PROPRANOLOL HCL ER 80 MG PO CP24: 80.0000 mg | ORAL_CAPSULE | Freq: Every day | ORAL | 4 refills | Status: DC

## 2020-10-06 MED ORDER — RIZATRIPTAN BENZOATE 10 MG PO TBDP: 10.0000 mg | ORAL_TABLET | ORAL | 12 refills | Status: DC | PRN

## 2020-10-06 NOTE — Progress Notes (Signed)
Doing well with migraines. This month I've had more due to weather which is my trigger. Overall well controlled on propranolol, and rizatriptan. I am averaging one a month but they are mild.
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
## 2020-10-13 ENCOUNTER — Ambulatory Visit (INDEPENDENT_AMBULATORY_CARE_PROVIDER_SITE_OTHER): Payer: 59 | Admitting: Bariatrics

## 2020-10-13 ENCOUNTER — Encounter (INDEPENDENT_AMBULATORY_CARE_PROVIDER_SITE_OTHER): Payer: Self-pay | Admitting: Bariatrics

## 2020-10-13 ENCOUNTER — Other Ambulatory Visit: Payer: Self-pay

## 2020-10-13 VITALS — BP 140/82 | HR 86 | Temp 98.4°F | Ht 66.0 in | Wt 247.0 lb

## 2020-10-13 DIAGNOSIS — G43109 Migraine with aura, not intractable, without status migrainosus: Secondary | ICD-10-CM

## 2020-10-13 DIAGNOSIS — E038 Other specified hypothyroidism: Secondary | ICD-10-CM | POA: Diagnosis not present

## 2020-10-13 DIAGNOSIS — Z6841 Body Mass Index (BMI) 40.0 and over, adult: Secondary | ICD-10-CM | POA: Diagnosis not present

## 2020-10-14 NOTE — Progress Notes (Signed)
Chief Complaint:   OBESITY Erin Warren is here to discuss her progress with her obesity treatment plan along with follow-up of her obesity related diagnoses. Erin Warren is on the Category 2 Plan or the [place] and states she is following her eating plan approximately 70% of the time. Erin Warren states she is walking for 10-15 minutes 7 times per week.  Today's visit was #: 56 Starting weight: 240 lbs Starting date: 06/28/2018 Today's weight: 247 lbs Today's date: 10/13/2020 Total lbs lost to date: 0 Total lbs lost since last in-office visit: 0  Interim History: Erin Warren's weight remains the same. She is doing well with water and protein.   Subjective:   1. Other specified hypothyroidism Erin Warren is currently taking Synthroid, and last TSH was 2.580.  2. Migraine with aura and without status migrainosus, not intractable Erin Warren's symptoms are controlled, only notes 1 migraine per month. She is taking Maxalt and propranolol.  Assessment/Plan:   1. Other specified hypothyroidism Patient with long-standing hypothyroidism, and Erin Warren will continue taking Synthroid. She appears euthyroid. Orders and follow up as documented in patient record.  Counseling . Good thyroid control is important for overall health. Supratherapeutic thyroid levels are dangerous and will not improve weight loss results. . The correct way to take levothyroxine is fasting, with water, separated by at least 30 minutes from breakfast, and separated by more than 4 hours from calcium, iron, multivitamins, acid reflux medications (PPIs).   2. Migraine with aura and without status migrainosus, not intractable We discussed that migraines can improve with weight loss.  3. Class 3 severe obesity with serious comorbidity and body mass index (BMI) of 40.0 to 44.9 in adult, unspecified obesity type [facility]) Erin Warren is currently in the action stage of change. As such, her goal is to continue with weight loss efforts. She has agreed to the  Category 2 Plan.   Intentional eating. Recipes II. She will consider journaling.  Exercise goals: 10-15 minutes of home exercise.  Behavioral modification strategies: increasing lean protein intake, decreasing simple carbohydrates, increasing vegetables, increasing water intake, decreasing eating out, no skipping meals, meal planning and cooking strategies, keeping healthy foods in the home and planning for success.  Erin Warren has agreed to follow-up with our clinic in 4 weeks. She was informed of the importance of frequent follow-up visits to maximize her success with intensive lifestyle modifications for her multiple health conditions.   Objective:   Blood pressure 140/82, pulse 86, temperature 98.4 F (36.9 C), height 5\' 6"  (1.676 m), weight 247 lb (112 kg), last menstrual period 09/15/2020, SpO2 97 %. Body mass index is 39.87 kg/m.  General: Cooperative, alert, well developed, in no acute distress. HEENT: Conjunctivae and lids unremarkable. Cardiovascular: Regular rhythm.  Lungs: Normal work of breathing. Neurologic: No focal deficits.   Lab Results  Component Value Date   CREATININE 0.83 12/13/2019   BUN 17 12/13/2019   NA 139 12/13/2019   K 4.4 12/13/2019   CL 102 12/13/2019   CO2 20 12/13/2019   Lab Results  Component Value Date   ALT 20 12/13/2019   AST 19 12/13/2019   ALKPHOS 105 12/13/2019   BILITOT <0.2 12/13/2019   Lab Results  Component Value Date   HGBA1C 5.2 12/13/2019   HGBA1C 5.1 11/14/2018   HGBA1C 5.2 06/28/2018   HGBA1C 4.7 09/27/2013   Lab Results  Component Value Date   INSULIN 16.5 12/13/2019   INSULIN 20.4 11/14/2018   INSULIN 14.2 06/28/2018   Lab Results  Component  Value Date   TSH 2.580 12/13/2019   Lab Results  Component Value Date   CHOL 194 12/13/2019   HDL 67 12/13/2019   LDLCALC 105 (H) 12/13/2019   TRIG 123 12/13/2019   CHOLHDL 3.1 10/18/2017   Lab Results  Component Value Date   WBC 11.6 (H) 06/28/2018   HGB 14.8  06/28/2018   HCT 43.8 06/28/2018   MCV 90 06/28/2018   PLT 375.0 02/03/2018   No results found for: IRON, TIBC, FERRITIN  Attestation Statements:   Reviewed by clinician on day of visit: allergies, medications, problem list, medical history, surgical history, family history, social history, and previous encounter notes.  Time spent on visit including pre-visit chart review and post-visit care and charting was 20 minutes.    Wilhemena Durie, am acting as Location manager for [employer], DO.  I have reviewed the above documentation for accuracy and completeness, and I agree with the above. Jearld Lesch, DO

## 2020-10-16 ENCOUNTER — Encounter (INDEPENDENT_AMBULATORY_CARE_PROVIDER_SITE_OTHER): Payer: Self-pay | Admitting: Bariatrics

## 2020-11-10 ENCOUNTER — Other Ambulatory Visit: Payer: Self-pay

## 2020-11-10 ENCOUNTER — Encounter (INDEPENDENT_AMBULATORY_CARE_PROVIDER_SITE_OTHER): Payer: Self-pay | Admitting: Bariatrics

## 2020-11-10 ENCOUNTER — Ambulatory Visit (INDEPENDENT_AMBULATORY_CARE_PROVIDER_SITE_OTHER): Payer: 59 | Admitting: Bariatrics

## 2020-11-10 VITALS — BP 136/84 | HR 83 | Temp 97.8°F | Ht 66.0 in | Wt 247.0 lb

## 2020-11-10 DIAGNOSIS — E8881 Metabolic syndrome: Secondary | ICD-10-CM | POA: Diagnosis not present

## 2020-11-10 DIAGNOSIS — E038 Other specified hypothyroidism: Secondary | ICD-10-CM

## 2020-11-10 DIAGNOSIS — Z6839 Body mass index (BMI) 39.0-39.9, adult: Secondary | ICD-10-CM

## 2020-11-11 NOTE — Progress Notes (Unsigned)
Chief Complaint:   OBESITY Erin Warren is here to discuss her progress with her obesity treatment plan along with follow-up of her obesity related diagnoses. Erin Warren is on the Category 2 Plan and states she is following her eating plan approximately 50% of the time. Erin Warren states she is walking 5-10 minutes 3 times per week.  Today's visit was #: 8 Starting weight: 240 lbs Starting date: 06/28/2018 Today's weight: 247 lbs Today's date: 11/10/2020 Total lbs lost to date: 0 Total lbs lost since last in-office visit: 0  Interim History: [name]'s weight remains the same. She is packing her lunch and getting adequate water. She struggles slightly with dinner.  Subjective:   1. Insulin resistance Erin Warren is taking Metformin.  Lab Results  Component Value Date   INSULIN 16.5 12/13/2019   INSULIN 20.4 11/14/2018   INSULIN 14.2 06/28/2018   Lab Results  Component Value Date   HGBA1C 5.2 12/13/2019    2. Other specified hypothyroidism Erin Warren is taking Synthroid. Her energy is ok.  Lab Results  Component Value Date   TSH 2.580 12/13/2019    Assessment/Plan:   1. Insulin resistance Erin Warren will continue to work on weight loss, exercise, and decreasing simple carbohydrates to help decrease the risk of diabetes. Erin Warren agreed to follow-up with [name] as directed to closely monitor her progress. Continue Metformin.  2. Other specified hypothyroidism Patient with long-standing hypothyroidism, on levothyroxine therapy. She appears euthyroid. Orders and follow up as documented in patient record. Continue Synthroid.  Counseling . Good thyroid control is important for overall health. Supratherapeutic thyroid levels are dangerous and will not improve weight loss results. . The correct way to take levothyroxine is fasting, with water, separated by at least 30 minutes from breakfast, and separated by more than 4 hours from calcium, iron, multivitamins, acid reflux medications (PPIs).   3. Class 2  severe obesity due to excess calories with serious comorbidity and body mass index (BMI) of 39.0 to 39.9 in adult Erin Warren is currently in the action stage of change. As such, her goal is to continue with weight loss efforts. She has agreed to the Category 2 Plan.   Meal plan Recipes II and Increase protein  Increase fiber  Exercise goals: Will continue to increase activities and walking at work.  Behavioral modification strategies: increasing lean protein intake, decreasing simple carbohydrates, increasing vegetables, increasing water intake, decreasing eating out, no skipping meals, meal planning and cooking strategies, keeping healthy foods in the home and planning for success.  Erin Warren has agreed to follow-up with our clinic in 4 weeks. She was informed of the importance of frequent follow-up visits to maximize her success with intensive lifestyle modifications for her multiple health conditions.   Objective:   Blood pressure 136/84, pulse 83, temperature 97.8 F (36.6 C), height 5\' 6"  (1.676 m), weight 247 lb (112 kg), SpO2 97 %. Body mass index is 39.87 kg/m.  General: Cooperative, alert, well developed, in no acute distress. HEENT: Conjunctivae and lids unremarkable. Cardiovascular: Regular rhythm.  Lungs: Normal work of breathing. Neurologic: No focal deficits.   Lab Results  Component Value Date   CREATININE 0.83 12/13/2019   BUN 17 12/13/2019   NA 139 12/13/2019   K 4.4 12/13/2019   CL 102 12/13/2019   CO2 20 12/13/2019   Lab Results  Component Value Date   ALT 20 12/13/2019   AST 19 12/13/2019   ALKPHOS 105 12/13/2019   BILITOT <0.2 12/13/2019   Lab Results  Component  Value Date   HGBA1C 5.2 12/13/2019   HGBA1C 5.1 11/14/2018   HGBA1C 5.2 06/28/2018   HGBA1C 4.7 09/27/2013   Lab Results  Component Value Date   INSULIN 16.5 12/13/2019   INSULIN 20.4 11/14/2018   INSULIN 14.2 06/28/2018   Lab Results  Component Value Date   TSH 2.580 12/13/2019    Lab Results  Component Value Date   CHOL 194 12/13/2019   HDL 67 12/13/2019   LDLCALC 105 (H) 12/13/2019   TRIG 123 12/13/2019   CHOLHDL 3.1 10/18/2017   Lab Results  Component Value Date   WBC 11.6 (H) 06/28/2018   HGB 14.8 06/28/2018   HCT 43.8 06/28/2018   MCV 90 06/28/2018   PLT 375.0 02/03/2018    Attestation Statements:   Reviewed by clinician on day of visit: allergies, medications, problem list, medical history, surgical history, family history, social history, and previous encounter notes.  Time spent on visit including pre-visit chart review and post-visit care and charting was 20 minutes.   Coral Ceo, am acting as Location manager for [employer], DO.  I have reviewed the above documentation for accuracy and completeness, and I agree with the above. Jearld Lesch, DO

## 2020-11-12 ENCOUNTER — Encounter (INDEPENDENT_AMBULATORY_CARE_PROVIDER_SITE_OTHER): Payer: Self-pay | Admitting: Bariatrics

## 2020-12-08 ENCOUNTER — Ambulatory Visit (INDEPENDENT_AMBULATORY_CARE_PROVIDER_SITE_OTHER): Payer: 59 | Admitting: Bariatrics

## 2020-12-25 ENCOUNTER — Other Ambulatory Visit: Payer: Self-pay

## 2020-12-25 ENCOUNTER — Ambulatory Visit (INDEPENDENT_AMBULATORY_CARE_PROVIDER_SITE_OTHER): Payer: 59 | Admitting: Bariatrics

## 2020-12-25 ENCOUNTER — Encounter (INDEPENDENT_AMBULATORY_CARE_PROVIDER_SITE_OTHER): Payer: Self-pay | Admitting: Bariatrics

## 2020-12-25 VITALS — BP 136/80 | HR 85 | Temp 97.8°F | Ht 66.0 in | Wt 248.0 lb

## 2020-12-25 DIAGNOSIS — E559 Vitamin D deficiency, unspecified: Secondary | ICD-10-CM | POA: Diagnosis not present

## 2020-12-25 DIAGNOSIS — Z6839 Body mass index (BMI) 39.0-39.9, adult: Secondary | ICD-10-CM

## 2020-12-25 DIAGNOSIS — Z9189 Other specified personal risk factors, not elsewhere classified: Secondary | ICD-10-CM | POA: Diagnosis not present

## 2020-12-25 DIAGNOSIS — E038 Other specified hypothyroidism: Secondary | ICD-10-CM | POA: Diagnosis not present

## 2020-12-25 DIAGNOSIS — E8881 Metabolic syndrome: Secondary | ICD-10-CM

## 2020-12-25 DIAGNOSIS — R0989 Other specified symptoms and signs involving the circulatory and respiratory systems: Secondary | ICD-10-CM

## 2020-12-26 LAB — LIPID PANEL WITH LDL/HDL RATIO
Cholesterol, Total: 175 mg/dL (ref 100–199)
HDL: 54 mg/dL (ref >39)
LDL Chol Calc (NIH): 82 mg/dL (ref 0–99)
LDL/HDL Ratio: 1.5 ratio (ref 0.0–3.2)
Triglycerides: 235 mg/dL — ABNORMAL HIGH (ref 0–149)
VLDL Cholesterol Cal: 39 mg/dL (ref 5–40)

## 2020-12-26 LAB — COMPREHENSIVE METABOLIC PANEL
ALT: 20 IU/L (ref 0–32)
AST: 16 IU/L (ref 0–40)
Albumin/Globulin Ratio: 1.2 (ref 1.2–2.2)
Albumin: 4.2 g/dL (ref 3.8–4.8)
Alkaline Phosphatase: 106 IU/L (ref 44–121)
BUN/Creatinine Ratio: 17 (ref 9–23)
BUN: 15 mg/dL (ref 6–24)
Bilirubin Total: 0.3 mg/dL (ref 0.0–1.2)
CO2: 22 mmol/L (ref 20–29)
Calcium: 10.1 mg/dL (ref 8.7–10.2)
Chloride: 100 mmol/L (ref 96–106)
Creatinine, Ser: 0.86 mg/dL (ref 0.57–1.00)
Globulin, Total: 3.4 g/dL (ref 1.5–4.5)
Glucose: 81 mg/dL (ref 65–99)
Potassium: 4.6 mmol/L (ref 3.5–5.2)
Sodium: 139 mmol/L (ref 134–144)
Total Protein: 7.6 g/dL (ref 6.0–8.5)
eGFR: 84 mL/min/{1.73_m2} (ref >59)

## 2020-12-26 LAB — TSH+T4F+T3FREE
Free T4: 1.52 ng/dL (ref 0.82–1.77)
T3, Free: 2.9 pg/mL (ref 2.0–4.4)
TSH: 2.43 u[IU]/mL (ref 0.450–4.500)

## 2020-12-26 LAB — VITAMIN D 25 HYDROXY (VIT D DEFICIENCY, FRACTURES): Vit D, 25-Hydroxy: 49.2 ng/mL (ref 30.0–100.0)

## 2020-12-26 LAB — HEMOGLOBIN A1C
Est. average glucose Bld gHb Est-mCnc: 103 mg/dL
Hgb A1c MFr Bld: 5.2 % (ref 4.8–5.6)

## 2020-12-26 LAB — INSULIN, RANDOM: INSULIN: 14.9 u[IU]/mL (ref 2.6–24.9)

## 2020-12-29 ENCOUNTER — Encounter (INDEPENDENT_AMBULATORY_CARE_PROVIDER_SITE_OTHER): Payer: Self-pay | Admitting: Bariatrics

## 2020-12-29 NOTE — Progress Notes (Signed)
Chief Complaint:   OBESITY Erin Warren is here to discuss her progress with her obesity treatment plan along with follow-up of her obesity related diagnoses. Erin Warren is on the Category 2 Plan and states she is following her eating plan approximately 60% of the time. [name] states she is not exercising regularly.  Today's visit was #: 20 Starting weight: 240 lbs Starting date: 06/28/2018 Today's weight: 248 lbs Today's date: 12/25/2020 Total lbs lost to date: 0 Total lbs lost since last in-office visit: 0  Interim History: [name] is up 1 pound since her last visit.  Subjective:   1. Insulin resistance Erin Warren has a diagnosis of insulin resistance based on her elevated fasting insulin level >5. She continues to work on diet and exercise to decrease her risk of diabetes.  She is taking metformin 500 mg twice daily.  She also has the diagnosis of PCOS.  Lab Results  Component Value Date   INSULIN 14.9 12/25/2020   INSULIN 16.5 12/13/2019   INSULIN 20.4 11/14/2018   INSULIN 14.2 06/28/2018   Lab Results  Component Value Date   HGBA1C 5.2 12/25/2020   2. Vitamin D deficiency Erin Warren's Vitamin D level was 50.3 on 12/13/2019. She is currently taking OTC vitamin D 2,000 IU each day. She denies nausea, vomiting or muscle weakness.  3. Other specified hypothyroidism Erin Warren is taking Synthroid 25 mcg daily.  Lab Results  Component Value Date   TSH 2.430 12/25/2020   4. Labile blood pressure Erin Warren does not take any medications for blood pressure, but does take propranolol ER 80 mg daily.  BP Readings from Last 3 Encounters:  12/25/20 136/80  11/10/20 136/84  10/13/20 140/82   5. At risk for diabetes mellitus Erin Warren is at higher than average risk for developing diabetes due to obesity and insulin resistance.   Assessment/Plan:   1. Insulin resistance Magaret will continue to work on weight loss, exercise, and decreasing simple carbohydrates to help decrease the risk of diabetes. Marquasha  agreed to follow-up with [name] as directed to closely monitor her progress.  Continue metformin.  Will check A1c and insulin today.  - Hemoglobin A1c - Insulin, random  2. Vitamin D deficiency Low Vitamin D level contributes to fatigue and are associated with obesity, breast, and colon cancer. She agrees to continue to take OTC vitamin D 2,000 IU daily, and we will check her vitamin D level today, as per below.  - VITAMIN D 25 Hydroxy (Vit-D Deficiency, Fractures)  3. Other specified hypothyroidism Patient with long-standing hypothyroidism, on levothyroxine therapy. She appears euthyroid. Orders and follow up as documented in patient record.  Continue Synthroid.  Will check thyroid panel today.  Counseling . Good thyroid control is important for overall health. Supratherapeutic thyroid levels are dangerous and will not improve weight loss results. . The correct way to take levothyroxine is fasting, with water, separated by at least 30 minutes from breakfast, and separated by more than 4 hours from calcium, iron, multivitamins, acid reflux medications (PPIs).   - TSH+T4F+T3Free  4. Labile blood pressure Will check CMP and lipid panel today, as per below.  - Comprehensive metabolic panel - Lipid Panel With LDL/HDL Ratio  5. At risk for diabetes mellitus Erin Warren was given approximately 15 minutes of diabetes education and counseling today. We discussed intensive lifestyle modifications today with an emphasis on weight loss as well as increasing exercise and decreasing simple carbohydrates in her diet. We also reviewed medication options with an emphasis on risk versus  benefit of those discussed.   Repetitive spaced learning was employed today to elicit superior memory formation and behavioral change.  6. Obesity, current BMI 55  Erin Warren is currently in the action stage of change. As such, her goal is to continue with weight loss efforts. She has agreed to the Category 2 Plan.   She will work  on meal planning and mindful eating.  Exercise goals: Will increase exercise.  Behavioral modification strategies: increasing lean protein intake, decreasing simple carbohydrates, increasing vegetables, increasing water intake, decreasing eating out, no skipping meals, meal planning and cooking strategies, keeping healthy foods in the home and planning for success.  Erin Warren has agreed to follow-up with our clinic in 4 weeks. She was informed of the importance of frequent follow-up visits to maximize her success with intensive lifestyle modifications for her multiple health conditions.   Erin Warren was informed we would discuss her lab results at her next visit unless there is a critical issue that needs to be addressed sooner. Erin Warren agreed to keep her next visit at the agreed upon time to discuss these results.  Objective:   Blood pressure 136/80, pulse 85, temperature 97.8 F (36.6 C), height 5\' 6"  (1.676 m), weight 248 lb (112.5 kg), SpO2 95 %. Body mass index is 40.03 kg/m.  General: Cooperative, alert, well developed, in no acute distress. HEENT: Conjunctivae and lids unremarkable. Cardiovascular: Regular rhythm.  Lungs: Normal work of breathing. Neurologic: No focal deficits.   Lab Results  Component Value Date   CREATININE 0.86 12/25/2020   BUN 15 12/25/2020   NA 139 12/25/2020   K 4.6 12/25/2020   CL 100 12/25/2020   CO2 22 12/25/2020   Lab Results  Component Value Date   ALT 20 12/25/2020   AST 16 12/25/2020   ALKPHOS 106 12/25/2020   BILITOT 0.3 12/25/2020   Lab Results  Component Value Date   HGBA1C 5.2 12/25/2020   HGBA1C 5.2 12/13/2019   HGBA1C 5.1 11/14/2018   HGBA1C 5.2 06/28/2018   HGBA1C 4.7 09/27/2013   Lab Results  Component Value Date   INSULIN 14.9 12/25/2020   INSULIN 16.5 12/13/2019   INSULIN 20.4 11/14/2018   INSULIN 14.2 06/28/2018   Lab Results  Component Value Date   TSH 2.430 12/25/2020   Lab Results  Component Value Date   CHOL 175  12/25/2020   HDL 54 12/25/2020   LDLCALC 82 12/25/2020   TRIG 235 (H) 12/25/2020   CHOLHDL 3.1 10/18/2017   Lab Results  Component Value Date   WBC 11.6 (H) 06/28/2018   HGB 14.8 06/28/2018   HCT 43.8 06/28/2018   MCV 90 06/28/2018   PLT 375.0 02/03/2018   Attestation Statements:   Reviewed by clinician on day of visit: allergies, medications, problem list, medical history, surgical history, family history, social history, and previous encounter notes.  I, Water quality scientist, CMA, am acting as Location manager for [employer], DO  I have reviewed the above documentation for accuracy and completeness, and I agree with the above. Jearld Lesch, DO

## 2021-01-22 ENCOUNTER — Ambulatory Visit (INDEPENDENT_AMBULATORY_CARE_PROVIDER_SITE_OTHER): Payer: 59 | Admitting: Bariatrics

## 2021-01-22 ENCOUNTER — Other Ambulatory Visit: Payer: Self-pay

## 2021-01-22 ENCOUNTER — Encounter (INDEPENDENT_AMBULATORY_CARE_PROVIDER_SITE_OTHER): Payer: Self-pay | Admitting: Bariatrics

## 2021-01-22 VITALS — BP 150/89 | HR 90 | Temp 97.5°F

## 2021-01-22 DIAGNOSIS — R030 Elevated blood-pressure reading, without diagnosis of hypertension: Secondary | ICD-10-CM

## 2021-01-22 DIAGNOSIS — E038 Other specified hypothyroidism: Secondary | ICD-10-CM | POA: Diagnosis not present

## 2021-01-22 DIAGNOSIS — Z6839 Body mass index (BMI) 39.0-39.9, adult: Secondary | ICD-10-CM | POA: Diagnosis not present

## 2021-01-27 ENCOUNTER — Encounter (INDEPENDENT_AMBULATORY_CARE_PROVIDER_SITE_OTHER): Payer: Self-pay | Admitting: Bariatrics

## 2021-01-27 NOTE — Progress Notes (Signed)
Chief Complaint:   OBESITY Erin Warren is here to discuss her progress with her obesity treatment plan along with follow-up of her obesity related diagnoses. Erin Warren is on the Category 2 Plan and states she is following her eating plan approximately 60% of the time. Erin Warren states she is not exercising regularly.  Today's visit was #: 47 Starting weight: 240 lbs Starting date: 06/28/2018 Today's weight: 248 lbs Today's date: 01/22/2021 Total lbs lost to date: 0 Total lbs lost since last in-office visit: 0  Interim History: Erin Warren's weight remains the same.  She has found an orthopedist for her ulnar nerve.  She is doing well with her water and protein.  Subjective:   1. Elevated blood pressure reading No diagnosis of hypertension.  She has had increased stress.  BP Readings from Last 3 Encounters:  12/25/20 136/80  11/10/20 136/84  10/13/20 140/82   2. Other specified hypothyroidism She is taking Synthroid.  Lab Results  Component Value Date   TSH 2.430 12/25/2020   Assessment/Plan:   1. Elevated blood pressure reading Erin Warren is working on healthy weight loss and exercise to improve blood pressure control. We will watch for signs of hypotension as she continues her lifestyle modifications.  Will follow over time.  2. Other specified hypothyroidism Patient with long-standing hypothyroidism, on levothyroxine therapy. She appears euthyroid. Orders and follow up as documented in patient record.  Continue Synthroid.  Counseling . Good thyroid control is important for overall health. Supratherapeutic thyroid levels are dangerous and will not improve weight loss results. . The correct way to take levothyroxine is fasting, with water, separated by at least 30 minutes from breakfast, and separated by more than 4 hours from calcium, iron, multivitamins, acid reflux medications (PPIs).   3. Obesity, current BMI 2  Erin Warren is currently in the action stage of change. As such, her goal is  to continue with weight loss efforts. She has agreed to the Category 2 Plan.   She will work on meal planning, intentional eating, and will cut down on Easter candy.  Exercise goals: Will increase exercise.  Behavioral modification strategies: increasing lean protein intake, decreasing simple carbohydrates, increasing vegetables, increasing water intake, decreasing eating out, no skipping meals, meal planning and cooking strategies, keeping healthy foods in the home and avoiding temptations.  Erin Warren has agreed to follow-up with our clinic in 4 weeks. She was informed of the importance of frequent follow-up visits to maximize her success with intensive lifestyle modifications for her multiple health conditions.   Objective:   Pulse 90, temperature (!) 97.5 F (36.4 C), height (P) 5\' 6"  (1.676 m), weight (P) 248 lb (112.5 kg), SpO2 95 %. Body mass index is 40.03 kg/m (pended).  General: Cooperative, alert, well developed, in no acute distress. HEENT: Conjunctivae and lids unremarkable. Cardiovascular: Regular rhythm.  Lungs: Normal work of breathing. Neurologic: No focal deficits.   Lab Results  Component Value Date   CREATININE 0.86 12/25/2020   BUN 15 12/25/2020   NA 139 12/25/2020   K 4.6 12/25/2020   CL 100 12/25/2020   CO2 22 12/25/2020   Lab Results  Component Value Date   ALT 20 12/25/2020   AST 16 12/25/2020   ALKPHOS 106 12/25/2020   BILITOT 0.3 12/25/2020   Lab Results  Component Value Date   HGBA1C 5.2 12/25/2020   HGBA1C 5.2 12/13/2019   HGBA1C 5.1 11/14/2018   HGBA1C 5.2 06/28/2018   HGBA1C 4.7 09/27/2013   Lab Results  Component Value  Date   INSULIN 14.9 12/25/2020   INSULIN 16.5 12/13/2019   INSULIN 20.4 11/14/2018   INSULIN 14.2 06/28/2018   Lab Results  Component Value Date   TSH 2.430 12/25/2020   Lab Results  Component Value Date   CHOL 175 12/25/2020   HDL 54 12/25/2020   LDLCALC 82 12/25/2020   TRIG 235 (H) 12/25/2020   CHOLHDL 3.1  10/18/2017   Lab Results  Component Value Date   WBC 11.6 (H) 06/28/2018   HGB 14.8 06/28/2018   HCT 43.8 06/28/2018   MCV 90 06/28/2018   PLT 375.0 02/03/2018   Attestation Statements:   Reviewed by clinician on day of visit: allergies, medications, problem list, medical history, surgical history, family history, social history, and previous encounter notes.  Time spent on visit including pre-visit chart review and post-visit care and charting was 20 minutes.   I, Water quality scientist, CMA, am acting as Location manager for [employer], DO  I have reviewed the above documentation for accuracy and completeness, and I agree with the above. Jearld Lesch, DO

## 2021-02-16 ENCOUNTER — Encounter (INDEPENDENT_AMBULATORY_CARE_PROVIDER_SITE_OTHER): Payer: Self-pay | Admitting: Bariatrics

## 2021-02-16 ENCOUNTER — Ambulatory Visit (INDEPENDENT_AMBULATORY_CARE_PROVIDER_SITE_OTHER): Payer: 59 | Admitting: Bariatrics

## 2021-02-16 ENCOUNTER — Other Ambulatory Visit: Payer: Self-pay

## 2021-02-16 VITALS — BP 160/100 | HR 86 | Temp 98.0°F | Ht 66.0 in | Wt 246.0 lb

## 2021-02-16 DIAGNOSIS — Z9189 Other specified personal risk factors, not elsewhere classified: Secondary | ICD-10-CM

## 2021-02-16 DIAGNOSIS — R0989 Other specified symptoms and signs involving the circulatory and respiratory systems: Secondary | ICD-10-CM

## 2021-02-16 DIAGNOSIS — Z6839 Body mass index (BMI) 39.0-39.9, adult: Secondary | ICD-10-CM

## 2021-02-16 DIAGNOSIS — E038 Other specified hypothyroidism: Secondary | ICD-10-CM

## 2021-02-16 MED ORDER — CHLORTHALIDONE 15 MG PO TABS: 15.0000 mg | ORAL_TABLET | Freq: Every day | ORAL | 0 refills | Status: DC

## 2021-02-19 NOTE — Progress Notes (Signed)
Chief Complaint:   OBESITY Erin Warren is here to discuss her progress with her obesity treatment plan along with follow-up of her obesity related diagnoses. Erin Warren is on the Category 2 Plan and states she is following her eating plan approximately 0% of the time. Erin Warren states she is moving and packing more 4 hours 1 times per week.  Today's visit was #: 40 Starting weight: 240 lbs Starting date: 06/28/2018 Today's weight: 246 lbs Today's date: 02/16/2021 Total lbs lost to date: 0 Total lbs lost since last in-office visit: 2  Interim History: Erin Warren is down an additional 2 pounds.  She has been in the "moving mode".  Subjective:   1. Labile blood pressure  Blood pressure today is 150/84, 01/22/21 BP was 150/89, and on 09/27/20 BP was 141/86.She is on no medication currently. She has taken Lisinopril and Cozaar in the past.  2. Other specified hypothyroidism Erin Warren will continue her medication.  Lab Results  Component Value Date   TSH 2.430 12/25/2020   3. At risk for heart disease [name] is at a higher than average risk for cardiovascular disease due to obesity and hypertension.   Assessment/Plan:   1. Labile blood pressure Erin Warren will begin Hydrochlorothiazide 15 mg 1 pill by mouth daily, #30 0RF.  2. Other specified hypothyroidism Patient with long-standing hypothyroidism, on levothyroxine therapy. She appears euthyroid. Orders and follow up as documented in patient record. Erin Warren will continue her medication.  Counseling Good thyroid control is important for overall health. Supratherapeutic thyroid levels are dangerous and will not improve weight loss results. The correct way to take levothyroxine is fasting, with water, separated by at least 30 minutes from breakfast, and separated by more than 4 hours from calcium, iron, multivitamins, acid reflux medications (PPIs).    3. At risk for heart disease Erin Warren was given approximately 15 minutes of coronary artery disease prevention  counseling today. She is 45 y.o. female and has risk factors for heart disease including obesity. We discussed intensive lifestyle modifications today with an emphasis on specific weight loss instructions and strategies.   Repetitive spaced learning was employed today to elicit superior memory formation and behavioral change.   4. Obesity, current BMI 73 Erin Warren is currently in the action stage of change. As such, her goal is to continue with weight loss efforts. She has agreed to the Category 2 Plan.   Exercise goals: As is and do more exercising.  Behavioral modification strategies: increasing lean protein intake, decreasing simple carbohydrates, increasing vegetables, increasing water intake, decreasing eating out, no skipping meals, meal planning and cooking strategies, keeping healthy foods in the home, and planning for success.  Erin Warren has agreed to follow-up with our clinic in 3 weeks. She was informed of the importance of frequent follow-up visits to maximize her success with intensive lifestyle modifications for her multiple health conditions.   Objective:   Blood pressure (!) 160/100, pulse 86, temperature 98 F (36.7 C), height 5\' 6"  (1.676 m), weight 246 lb (111.6 kg), last menstrual period 02/10/2021, SpO2 95 %. Body mass index is 39.71 kg/m.  General: Cooperative, alert, well developed, in no acute distress. HEENT: Conjunctivae and lids unremarkable. Cardiovascular: Regular rhythm.  Lungs: Normal work of breathing. Neurologic: No focal deficits.   Lab Results  Component Value Date   CREATININE 0.86 12/25/2020   BUN 15 12/25/2020   NA 139 12/25/2020   K 4.6 12/25/2020   CL 100 12/25/2020   CO2 22 12/25/2020   Lab Results  Component Value Date   ALT 20 12/25/2020   AST 16 12/25/2020   ALKPHOS 106 12/25/2020   BILITOT 0.3 12/25/2020   Lab Results  Component Value Date   HGBA1C 5.2 12/25/2020   HGBA1C 5.2 12/13/2019   HGBA1C 5.1 11/14/2018   HGBA1C 5.2 06/28/2018    HGBA1C 4.7 09/27/2013   Lab Results  Component Value Date   INSULIN 14.9 12/25/2020   INSULIN 16.5 12/13/2019   INSULIN 20.4 11/14/2018   INSULIN 14.2 06/28/2018   Lab Results  Component Value Date   TSH 2.430 12/25/2020   Lab Results  Component Value Date   CHOL 175 12/25/2020   HDL 54 12/25/2020   LDLCALC 82 12/25/2020   TRIG 235 (H) 12/25/2020   CHOLHDL 3.1 10/18/2017   Lab Results  Component Value Date   WBC 11.6 (H) 06/28/2018   HGB 14.8 06/28/2018   HCT 43.8 06/28/2018   MCV 90 06/28/2018   PLT 375.0 02/03/2018   No results found for: IRON, TIBC, FERRITIN  Attestation Statements:   Reviewed by clinician on day of visit: allergies, medications, problem list, medical history, surgical history, family history, social history, and previous encounter notes.  ILennette Bihari, CMA, am acting as Location manager for [employer], DO.  I have reviewed the above documentation for accuracy and completeness, and I agree with the above. Jearld Lesch, DO

## 2021-02-26 ENCOUNTER — Encounter (INDEPENDENT_AMBULATORY_CARE_PROVIDER_SITE_OTHER): Payer: Self-pay | Admitting: Bariatrics

## 2021-03-05 ENCOUNTER — Encounter (INDEPENDENT_AMBULATORY_CARE_PROVIDER_SITE_OTHER): Payer: Self-pay | Admitting: Bariatrics

## 2021-03-05 ENCOUNTER — Other Ambulatory Visit: Payer: Self-pay

## 2021-03-05 ENCOUNTER — Ambulatory Visit (INDEPENDENT_AMBULATORY_CARE_PROVIDER_SITE_OTHER): Payer: 59 | Admitting: Bariatrics

## 2021-03-05 VITALS — BP 146/88 | HR 86 | Temp 97.9°F | Ht 66.0 in | Wt 244.0 lb

## 2021-03-05 DIAGNOSIS — E8881 Metabolic syndrome: Secondary | ICD-10-CM | POA: Diagnosis not present

## 2021-03-05 DIAGNOSIS — Z6839 Body mass index (BMI) 39.0-39.9, adult: Secondary | ICD-10-CM

## 2021-03-05 DIAGNOSIS — Z9189 Other specified personal risk factors, not elsewhere classified: Secondary | ICD-10-CM

## 2021-03-05 DIAGNOSIS — I10 Essential (primary) hypertension: Secondary | ICD-10-CM

## 2021-03-05 DIAGNOSIS — E88819 Insulin resistance, unspecified: Secondary | ICD-10-CM

## 2021-03-05 DIAGNOSIS — E66812 Obesity, class 2: Secondary | ICD-10-CM

## 2021-03-05 MED ORDER — CHLORTHALIDONE 25 MG PO TABS: 25.0000 mg | ORAL_TABLET | Freq: Every day | ORAL | 0 refills | Status: DC

## 2021-03-10 NOTE — Progress Notes (Signed)
Chief Complaint:   OBESITY Erin Warren is here to discuss her progress with her obesity treatment plan along with follow-up of her obesity related diagnoses. Erin Warren is on the Category 2 Plan and states she is following her eating plan approximately 30% of the time. Erin Warren states she is walking 5 minutes 3-4 times per week.  Today's visit was #: 61 Starting weight: 240 lbs Starting date: 06/28/2018 Today's weight: 244 lbs Today's date: 03/05/2021 Total lbs lost to date: 0 Total lbs lost since last in-office visit: 2  Interim History: Erin Warren is down 2 lbs since her last visit.  Per Erin Warren, she is getting adequate water and protein.  Subjective:   1. Essential hypertension  Erin Warren was started on Chlorthalidone.  BP Readings from Last 3 Encounters:  03/05/21 (!) 146/88  02/16/21 (!) 160/100  01/22/21 (!) 150/89   2. Insulin resistance Erin Warren is taking Metformin. Lab Results  Component Value Date   INSULIN 14.9 12/25/2020   INSULIN 16.5 12/13/2019   INSULIN 20.4 11/14/2018   INSULIN 14.2 06/28/2018   Lab Results  Component Value Date   HGBA1C 5.2 12/25/2020    3. At risk for heart disease Erin Warren is at a higher than average risk for cardiovascular disease due to obesity and hypertension.   Assessment/Plan:   1. Essential hypertension Erin Warren is working on healthy weight loss and exercise to improve blood pressure control. We will watch for signs of hypotension as she continues her lifestyle modifications.  Refill- chlorthalidone (HYGROTON) 25 MG tablet; Take 1 tablet (25 mg total) by mouth daily.  Dispense: 30 tablet; Refill: 0  2. Insulin resistance Erin Warren will continue to work on weight loss, exercise, and decreasing simple carbohydrates to help decrease the risk of diabetes. Erin Warren agreed to follow-up with [name] as directed to closely monitor her progress. Continue Metformin, meal plan and exercise.  3. At risk for heart disease Erin Warren was given approximately 15 minutes of  coronary artery disease prevention counseling today. She is 45 y.o. female and has risk factors for heart disease including obesity. We discussed intensive lifestyle modifications today with an emphasis on specific weight loss instructions and strategies.   Repetitive spaced learning was employed today to elicit superior memory formation and behavioral change.   4. Class 2 severe obesity with serious comorbidity and body mass index (BMI) of 39.0 to 39.9 in adult, unspecified obesity type Erin Warren)  Erin Warren is currently in the action stage of change. As such, her goal is to continue with weight loss efforts. She has agreed to the Category 2 Plan.   Exercise goals:  As is  Behavioral modification strategies: increasing lean protein intake, decreasing simple carbohydrates, increasing vegetables, increasing water intake, decreasing eating out, no skipping meals, meal planning and cooking strategies, keeping healthy foods in the home, ways to avoid boredom eating, and planning for success.  Erin Warren has agreed to follow-up with our clinic in 4 weeks. She was informed of the importance of frequent follow-up visits to maximize her success with intensive lifestyle modifications for her multiple health conditions.   Objective:   Blood pressure (!) 146/88, pulse 86, temperature 97.9 F (36.6 C), height 5\' 6"  (1.676 m), weight 244 lb (110.7 kg), last menstrual period 02/10/2021, SpO2 96 %. Body mass index is 39.38 kg/m.  General: Cooperative, alert, well developed, in no acute distress. HEENT: Conjunctivae and lids unremarkable. Cardiovascular: Regular rhythm.  Lungs: Normal work of breathing. Neurologic: No focal deficits.   Lab Results  Component Value Date  CREATININE 0.86 12/25/2020   BUN 15 12/25/2020   NA 139 12/25/2020   K 4.6 12/25/2020   CL 100 12/25/2020   CO2 22 12/25/2020   Lab Results  Component Value Date   ALT 20 12/25/2020   AST 16 12/25/2020   ALKPHOS 106 12/25/2020   BILITOT  0.3 12/25/2020   Lab Results  Component Value Date   HGBA1C 5.2 12/25/2020   HGBA1C 5.2 12/13/2019   HGBA1C 5.1 11/14/2018   HGBA1C 5.2 06/28/2018   HGBA1C 4.7 09/27/2013   Lab Results  Component Value Date   INSULIN 14.9 12/25/2020   INSULIN 16.5 12/13/2019   INSULIN 20.4 11/14/2018   INSULIN 14.2 06/28/2018   Lab Results  Component Value Date   TSH 2.430 12/25/2020   Lab Results  Component Value Date   CHOL 175 12/25/2020   HDL 54 12/25/2020   LDLCALC 82 12/25/2020   TRIG 235 (H) 12/25/2020   CHOLHDL 3.1 10/18/2017   Lab Results  Component Value Date   VD25OH 49.2 12/25/2020   VD25OH 50.3 12/13/2019   VD25OH 47.5 06/28/2018   Lab Results  Component Value Date   WBC 11.6 (H) 06/28/2018   HGB 14.8 06/28/2018   HCT 43.8 06/28/2018   MCV 90 06/28/2018   PLT 375.0 02/03/2018   No results found for: IRON, TIBC, FERRITIN    Attestation Statements:   Reviewed by clinician on day of visit: allergies, medications, problem list, medical history, surgical history, family history, social history, and previous encounter notes.  I, Lizbeth Bark, RMA, am acting as Location manager for [employer], DO.  I have reviewed the above documentation for accuracy and completeness, and I agree with the above. Jearld Lesch, DO

## 2021-03-11 ENCOUNTER — Encounter (INDEPENDENT_AMBULATORY_CARE_PROVIDER_SITE_OTHER): Payer: Self-pay | Admitting: Bariatrics

## 2021-03-21 ENCOUNTER — Other Ambulatory Visit (INDEPENDENT_AMBULATORY_CARE_PROVIDER_SITE_OTHER): Payer: Self-pay | Admitting: Bariatrics

## 2021-03-21 DIAGNOSIS — R0989 Other specified symptoms and signs involving the circulatory and respiratory systems: Secondary | ICD-10-CM

## 2021-03-23 NOTE — Telephone Encounter (Signed)
Last OV with Dr Brown 

## 2021-03-27 ENCOUNTER — Other Ambulatory Visit (INDEPENDENT_AMBULATORY_CARE_PROVIDER_SITE_OTHER): Payer: Self-pay | Admitting: Bariatrics

## 2021-03-27 DIAGNOSIS — I10 Essential (primary) hypertension: Secondary | ICD-10-CM

## 2021-05-20 ENCOUNTER — Ambulatory Visit (INDEPENDENT_AMBULATORY_CARE_PROVIDER_SITE_OTHER): Payer: 59 | Admitting: Bariatrics

## 2021-05-20 ENCOUNTER — Other Ambulatory Visit: Payer: Self-pay

## 2021-05-20 ENCOUNTER — Encounter (INDEPENDENT_AMBULATORY_CARE_PROVIDER_SITE_OTHER): Payer: Self-pay | Admitting: Bariatrics

## 2021-05-20 VITALS — BP 146/86 | HR 70 | Temp 97.9°F | Ht 65.0 in | Wt 251.0 lb

## 2021-05-20 DIAGNOSIS — E8881 Metabolic syndrome: Secondary | ICD-10-CM | POA: Diagnosis not present

## 2021-05-20 DIAGNOSIS — Z9189 Other specified personal risk factors, not elsewhere classified: Secondary | ICD-10-CM

## 2021-05-20 DIAGNOSIS — Z6839 Body mass index (BMI) 39.0-39.9, adult: Secondary | ICD-10-CM

## 2021-05-20 DIAGNOSIS — E88819 Insulin resistance, unspecified: Secondary | ICD-10-CM

## 2021-05-20 DIAGNOSIS — I10 Essential (primary) hypertension: Secondary | ICD-10-CM | POA: Diagnosis not present

## 2021-05-20 MED ORDER — CHLORTHALIDONE 25 MG PO TABS: 25.0000 mg | ORAL_TABLET | Freq: Every day | ORAL | 0 refills | Status: DC

## 2021-05-21 ENCOUNTER — Encounter (INDEPENDENT_AMBULATORY_CARE_PROVIDER_SITE_OTHER): Payer: Self-pay | Admitting: Bariatrics

## 2021-05-21 NOTE — Progress Notes (Signed)
Chief Complaint:   OBESITY Erin Warren is here to discuss her progress with her obesity treatment plan along with follow-up of her obesity related diagnoses. Erin Warren is on the Category 2 Plan and states she is following her eating plan approximately 50% of the time. Erin Warren states she is doing 0 minutes 0 times per week.  Today's visit was #: 25 Starting weight: 240 lbs Starting date: 06/28/2018 Today's weight: 251 lbs Today's date:05/20/2021 Total lbs lost to date: 0 Total lbs lost since last in-office visit: 0  Interim History: Erin Warren is up 7 lbs since her last visit. She has not been eating as well ([employer] and eating out).  Subjective:   1. Essential hypertension Erin Warren has not been taking her blood pressure at home. [name] Her blood pressure was 146/86 today. She states that she ran out of medication and did not get it refilled.   2. Insulin resistance Erin Warren is currently taking Metformin.  3. At risk for heart disease Erin Warren is at risk for heart disease due to hypertension.  Assessment/Plan:   1. Essential hypertension Erin Warren is working on healthy weight loss and exercise to improve blood pressure control. We will refill Chlorthalidone 25 mg once daily for 3 months with no refills. We will watch for signs of hypotension as she continues her lifestyle modifications.  - chlorthalidone (HYGROTON) 25 MG tablet; Take 1 tablet (25 mg total) by mouth daily.  Dispense: 90 tablet; Refill: 0  2. Insulin resistance Erin Warren will continue medications. She will continue to work on weight loss, exercise, and decreasing simple carbohydrates to help decrease the risk of diabetes. Erin Warren agreed to follow-up with [name] as directed to closely monitor her progress.   3. At risk for heart disease Erin Warren was given approximately 15 minutes of coronary artery disease prevention counseling today. She is 45 y.o. female and has risk factors for heart disease including obesity. We discussed intensive lifestyle  modifications today with an emphasis on specific weight loss instructions and strategies.   Repetitive spaced learning was employed today to elicit superior memory formation and behavioral change.   4. Obesity, current BMI 40.6 Erin Warren is currently in the action stage of change. As such, her goal is to continue with weight loss efforts. She has agreed to the Category 2 Plan and keeping a food journal and adhering to recommended goals of 1200 calories and 80-90 grams of protein.   Erin Warren will continue meal planning. She will be more adherent to the plan. She will increase protein and H2O.   Exercise goals:  Erin Warren walking and walking in the house.   Behavioral modification strategies: increasing lean protein intake, decreasing simple carbohydrates, increasing vegetables, increasing water intake, decreasing eating out, no skipping meals, meal planning and cooking strategies, keeping healthy foods in the home, and planning for success.  Erin Warren has agreed to follow-up with our clinic in 4 weeks. She was informed of the importance of frequent follow-up visits to maximize her success with intensive lifestyle modifications for her multiple health conditions.   Objective:   Blood pressure (!) 146/86, pulse 70, temperature 97.9 F (36.6 C), height '5\' 5"'$  (1.651 m), weight 251 lb (113.9 kg), SpO2 96 %. Body mass index is 41.77 kg/m.  General: Cooperative, alert, well developed, in no acute distress. HEENT: Conjunctivae and lids unremarkable. Cardiovascular: Regular rhythm.  Lungs: Normal work of breathing. Neurologic: No focal deficits.   Lab Results  Component Value Date   CREATININE 0.86 12/25/2020   BUN 15 12/25/2020  NA 139 12/25/2020   K 4.6 12/25/2020   CL 100 12/25/2020   CO2 22 12/25/2020   Lab Results  Component Value Date   ALT 20 12/25/2020   AST 16 12/25/2020   ALKPHOS 106 12/25/2020   BILITOT 0.3 12/25/2020   Lab Results  Component Value Date   HGBA1C 5.2 12/25/2020    HGBA1C 5.2 12/13/2019   HGBA1C 5.1 11/14/2018   HGBA1C 5.2 06/28/2018   HGBA1C 4.7 09/27/2013   Lab Results  Component Value Date   INSULIN 14.9 12/25/2020   INSULIN 16.5 12/13/2019   INSULIN 20.4 11/14/2018   INSULIN 14.2 06/28/2018   Lab Results  Component Value Date   TSH 2.430 12/25/2020   Lab Results  Component Value Date   CHOL 175 12/25/2020   HDL 54 12/25/2020   LDLCALC 82 12/25/2020   TRIG 235 (H) 12/25/2020   CHOLHDL 3.1 10/18/2017   Lab Results  Component Value Date   VD25OH 49.2 12/25/2020   VD25OH 50.3 12/13/2019   VD25OH 47.5 06/28/2018   Lab Results  Component Value Date   WBC 11.6 (H) 06/28/2018   HGB 14.8 06/28/2018   HCT 43.8 06/28/2018   MCV 90 06/28/2018   PLT 375.0 02/03/2018   No results found for: IRON, TIBC, FERRITIN  Attestation Statements:   Reviewed by clinician on day of visit: allergies, medications, problem list, medical history, surgical history, family history, social history, and previous encounter notes.  I, Lizbeth Bark, RMA, am acting as Location manager for [employer], DO.   I have reviewed the above documentation for accuracy and completeness, and I agree with the above. Jearld Lesch, DO

## 2021-06-15 ENCOUNTER — Encounter (INDEPENDENT_AMBULATORY_CARE_PROVIDER_SITE_OTHER): Payer: Self-pay

## 2021-06-17 ENCOUNTER — Ambulatory Visit (INDEPENDENT_AMBULATORY_CARE_PROVIDER_SITE_OTHER): Payer: 59 | Admitting: Bariatrics

## 2021-06-17 ENCOUNTER — Encounter (INDEPENDENT_AMBULATORY_CARE_PROVIDER_SITE_OTHER): Payer: Self-pay | Admitting: Bariatrics

## 2021-06-17 ENCOUNTER — Other Ambulatory Visit: Payer: Self-pay

## 2021-06-17 VITALS — BP 166/104 | HR 88 | Temp 98.2°F | Ht 65.0 in | Wt 247.0 lb

## 2021-06-17 DIAGNOSIS — I10 Essential (primary) hypertension: Secondary | ICD-10-CM

## 2021-06-17 DIAGNOSIS — E8881 Metabolic syndrome: Secondary | ICD-10-CM | POA: Diagnosis not present

## 2021-06-17 DIAGNOSIS — Z6839 Body mass index (BMI) 39.0-39.9, adult: Secondary | ICD-10-CM

## 2021-06-18 NOTE — Progress Notes (Signed)
Chief Complaint:   OBESITY Erin Warren is here to discuss her progress with her obesity treatment plan along with follow-up of her obesity related diagnoses. Kamala is on the Category 2 Plan and keeping a food journal and adhering to recommended goals of 1200 calories and 80-90 grams of protein and states she is following her eating plan approximately 60% of the time. Yaritzy states she is using home stepper for 5 minutes 2 times per week.  Today's visit was #: 48 Starting weight: 240 lbs Starting date: 06/28/2018 Today's weight: 247 lbs Today's date: 06/17/2021 Total lbs lost to date: 0 Total lbs lost since last in-office visit: 4 lbs  Interim History: Erin Warren is down an additional 4 lbs since her last visit. She has cut back on her eating.  Subjective:   1. Essential hypertension Erin Warren is taking Hygroton and Inderal currently (migraines). She is taking her medications as directed.   2. Insulin resistance Erin Warren is taking Glucophage currently.   Assessment/Plan:   1. Essential hypertension Erin Warren will continue her medications. She is working on healthy weight loss and exercise to improve blood pressure control. We will watch for signs of hypotension as she continues her lifestyle modifications.  2. Insulin resistance Erin Warren will continue medications. She will continue to work on weight loss, exercise, and decreasing simple carbohydrates to help decrease the risk of diabetes. Erin Warren agreed to follow-up with Erin Warren as directed to closely monitor her progress.  3. Obesity with current BMI of 41.1 Erin Warren is currently in the action stage of change. As such, her goal is to continue with weight loss efforts. She has agreed to the Category 2 Plan and keeping a food journal and adhering to recommended goals of 1200 calories and 80-90 grams of protein.   Erin Warren will continue meal planning. She will continue to cut back on portion sizes and better choices.  Exercise goals:  Erin Warren will use the mini  stepper.  Behavioral modification strategies: increasing lean protein intake, decreasing simple carbohydrates, increasing vegetables, increasing water intake, decreasing eating out, no skipping meals, meal planning and cooking strategies, keeping healthy foods in the home, and planning for success.  Erin Warren has agreed to follow-up with our clinic in 4 weeks. She was informed of the importance of frequent follow-up visits to maximize her success with intensive lifestyle modifications for her multiple health conditions.   Objective:   Blood pressure (!) 166/104, pulse 88, temperature 98.2 F (36.8 C), height 5\' 5"  (1.651 m), weight 247 lb (112 kg), SpO2 95 %. Body mass index is 41.1 kg/m.  General: Cooperative, alert, well developed, in no acute distress. HEENT: Conjunctivae and lids unremarkable. Cardiovascular: Regular rhythm.  Lungs: Normal work of breathing. Neurologic: No focal deficits.   Lab Results  Component Value Date   CREATININE 0.86 12/25/2020   BUN 15 12/25/2020   NA 139 12/25/2020   K 4.6 12/25/2020   CL 100 12/25/2020   CO2 22 12/25/2020   Lab Results  Component Value Date   ALT 20 12/25/2020   AST 16 12/25/2020   ALKPHOS 106 12/25/2020   BILITOT 0.3 12/25/2020   Lab Results  Component Value Date   HGBA1C 5.2 12/25/2020   HGBA1C 5.2 12/13/2019   HGBA1C 5.1 11/14/2018   HGBA1C 5.2 06/28/2018   HGBA1C 4.7 09/27/2013   Lab Results  Component Value Date   INSULIN 14.9 12/25/2020   INSULIN 16.5 12/13/2019   INSULIN 20.4 11/14/2018   INSULIN 14.2 06/28/2018   Lab Results  Component Value Date   TSH 2.430 12/25/2020   Lab Results  Component Value Date   CHOL 175 12/25/2020   HDL 54 12/25/2020   LDLCALC 82 12/25/2020   TRIG 235 (H) 12/25/2020   CHOLHDL 3.1 10/18/2017   Lab Results  Component Value Date   VD25OH 49.2 12/25/2020   VD25OH 50.3 12/13/2019   VD25OH 47.5 06/28/2018   Lab Results  Component Value Date   WBC 11.6 (H) 06/28/2018   HGB  14.8 06/28/2018   HCT 43.8 06/28/2018   MCV 90 06/28/2018   PLT 375.0 02/03/2018   No results found for: IRON, TIBC, FERRITIN  Attestation Statements:   Reviewed by clinician on day of visit: allergies, medications, problem list, medical history, surgical history, family history, social history, and previous encounter notes.  I, Lizbeth Bark, RMA, am acting as Location manager for [employer], DO.   I have reviewed the above documentation for accuracy and completeness, and I agree with the above. Jearld Lesch, DO

## 2021-06-23 ENCOUNTER — Encounter (INDEPENDENT_AMBULATORY_CARE_PROVIDER_SITE_OTHER): Payer: Self-pay | Admitting: Bariatrics

## 2021-07-07 HISTORY — PX: ELBOW SURGERY: SHX618

## 2021-07-15 ENCOUNTER — Ambulatory Visit (INDEPENDENT_AMBULATORY_CARE_PROVIDER_SITE_OTHER): Payer: 59 | Admitting: Bariatrics

## 2021-07-15 ENCOUNTER — Encounter (INDEPENDENT_AMBULATORY_CARE_PROVIDER_SITE_OTHER): Payer: Self-pay | Admitting: Bariatrics

## 2021-07-15 ENCOUNTER — Other Ambulatory Visit: Payer: Self-pay

## 2021-07-15 VITALS — BP 160/92 | HR 91 | Temp 98.0°F | Ht 65.0 in | Wt 248.0 lb

## 2021-07-15 DIAGNOSIS — Z6839 Body mass index (BMI) 39.0-39.9, adult: Secondary | ICD-10-CM

## 2021-07-15 DIAGNOSIS — I10 Essential (primary) hypertension: Secondary | ICD-10-CM | POA: Diagnosis not present

## 2021-07-15 DIAGNOSIS — E8881 Metabolic syndrome: Secondary | ICD-10-CM

## 2021-07-15 MED ORDER — PROPRANOLOL HCL ER 120 MG PO CP24: 120.0000 mg | ORAL_CAPSULE | Freq: Every day | ORAL | 1 refills | Status: DC

## 2021-07-15 MED ORDER — CHLORTHALIDONE 25 MG PO TABS: 25.0000 mg | ORAL_TABLET | Freq: Every day | ORAL | 0 refills | Status: DC

## 2021-07-15 NOTE — Progress Notes (Signed)
Chief Complaint:   OBESITY Erin Warren is here to discuss her progress with her obesity treatment plan along with follow-up of her obesity related diagnoses. Erin Warren is on the Category 2 Plan and states she is following her eating plan approximately 50% of the time. Erin Warren states she is using a stair step for 3 minutes 3-4 times per week.  Today's visit was #: 42 Starting weight: 240 lbs Starting date: 06/28/2018 Today's weight: 248 lbs Today's date: 07/15/2021 Total lbs lost to date: 0 Total lbs lost since last in-office visit: 0  Interim History: Erin Warren has gained 1 lb since her last visit. She had more stress at work to get ready for her surgery   On her ulnar nerve. Her left arm is in a sling.  Subjective:   1. Essential hypertension Erin Warren is currently taking Hygroton as directed.   2. Insulin resistance Erin Warren is taking Metformin currently.   Assessment/Plan:   1. Essential hypertension Micky agrees to increase Propranolol ER 120 mg for 1 month with no refills. We will refill Chlorthalidone 25 mg for 3 months with no refills.  She is working on healthy weight loss and exercise to improve blood pressure control. We will watch for signs of hypotension as she continues her lifestyle modifications.  - chlorthalidone (HYGROTON) 25 MG tablet; Take 1 tablet (25 mg total) by mouth daily.  Dispense: 90 tablet; Refill: 0 - propranolol ER (INDERAL LA) 120 MG 24 hr capsule; Take 1 capsule (120 mg total) by mouth daily.  Dispense: 30 capsule; Refill: 1  2. Insulin resistance Erin Warren will continue Metformin. She will continue to work on weight loss, exercise, and decreasing simple carbohydrates to help decrease the risk of diabetes. Erin Warren agreed to follow-up with [name] as directed to closely monitor her progress.  3. Class 2 severe obesity with serious comorbidity and body mass index (BMI) of 39.0 to 39.9 in adult, unspecified obesity type Erin Warren) Erin Warren is currently in the action stage of change. As  such, her goal is to continue with weight loss efforts. She has agreed to the Category 2 Plan.   Erin Warren will continue meal planning and intentional eating. Strategies for the holiday were given out.   Exercise goals:  As is.  Behavioral modification strategies: increasing lean protein intake, decreasing simple carbohydrates, increasing vegetables, increasing water intake, decreasing eating out, no skipping meals, meal planning and cooking strategies, keeping healthy foods in the home, and planning for success.  Erin Warren has agreed to follow-up with our clinic in 6 weeks. She was informed of the importance of frequent follow-up visits to maximize her success with intensive lifestyle modifications for her multiple health conditions.   Objective:   Blood pressure (!) 160/92, pulse 91, temperature 98 F (36.7 C), height 5\' 5"  (1.651 m), weight 248 lb (112.5 kg), SpO2 95 %. Body mass index is 41.27 kg/m. Erin Warren has her left arm with sling on it (surgery on Monday).  General: Cooperative, alert, well developed, in no acute distress. HEENT: Conjunctivae and lids unremarkable. Cardiovascular: Regular rhythm.  Lungs: Normal work of breathing. Neurologic: No focal deficits.   Lab Results  Component Value Date   CREATININE 0.86 12/25/2020   BUN 15 12/25/2020   NA 139 12/25/2020   K 4.6 12/25/2020   CL 100 12/25/2020   CO2 22 12/25/2020   Lab Results  Component Value Date   ALT 20 12/25/2020   AST 16 12/25/2020   ALKPHOS 106 12/25/2020   BILITOT 0.3 12/25/2020  Lab Results  Component Value Date   HGBA1C 5.2 12/25/2020   HGBA1C 5.2 12/13/2019   HGBA1C 5.1 11/14/2018   HGBA1C 5.2 06/28/2018   HGBA1C 4.7 09/27/2013   Lab Results  Component Value Date   INSULIN 14.9 12/25/2020   INSULIN 16.5 12/13/2019   INSULIN 20.4 11/14/2018   INSULIN 14.2 06/28/2018   Lab Results  Component Value Date   TSH 2.430 12/25/2020   Lab Results  Component Value Date   CHOL 175 12/25/2020   HDL  54 12/25/2020   LDLCALC 82 12/25/2020   TRIG 235 (H) 12/25/2020   CHOLHDL 3.1 10/18/2017   Lab Results  Component Value Date   VD25OH 49.2 12/25/2020   VD25OH 50.3 12/13/2019   VD25OH 47.5 06/28/2018   Lab Results  Component Value Date   WBC 11.6 (H) 06/28/2018   HGB 14.8 06/28/2018   HCT 43.8 06/28/2018   MCV 90 06/28/2018   PLT 375.0 02/03/2018   No results found for: IRON, TIBC, FERRITIN  Attestation Statements:   Reviewed by clinician on day of visit: allergies, medications, problem list, medical history, surgical history, family history, social history, and previous encounter notes.  I, Lizbeth Bark, RMA, am acting as Location manager for [employer], DO.   I have reviewed the above documentation for accuracy and completeness, and I agree with the above. Jearld Lesch, DO

## 2021-08-07 ENCOUNTER — Other Ambulatory Visit (INDEPENDENT_AMBULATORY_CARE_PROVIDER_SITE_OTHER): Payer: Self-pay | Admitting: Bariatrics

## 2021-08-07 DIAGNOSIS — I10 Essential (primary) hypertension: Secondary | ICD-10-CM

## 2021-08-10 NOTE — Telephone Encounter (Signed)
LAST APPOINTMENT DATE: 07/15/21 NEXT APPOINTMENT DATE: 08/26/21   [id number] - [place], [place] - [address] [place] [address] Phone: [phone] Fax: [phone]  Patient is requesting a refill of the following medications: Pending Prescriptions:                       Disp   Refills   propranolol ER (INDERAL LA) 120 MG 24 hr c*30 cap*1       Sig: TAKE 1 CAPSULE BY MOUTH EVERY DAY   Date last filled: 07/15/21 Previously prescribed by Dr.Brown  Lab Results      Component                Value               Date                      HGBA1C                   5.2                 12/25/2020                HGBA1C                   5.2                 12/13/2019                HGBA1C                   5.1                 11/14/2018           Lab Results      Component                Value               Date                      LDLCALC                  82                  12/25/2020                CREATININE               0.86                12/25/2020           Lab Results      Component                Value               Date                      VD25OH                   49.2                12/25/2020                VD25OH                   50.3  12/13/2019                VD25OH                   47.5                06/28/2018            BP Readings from Last 3 Encounters: 07/15/21 : (!) 160/92 06/17/21 : (!) 166/104 05/20/21 : (!) 146/86

## 2021-08-26 ENCOUNTER — Encounter (INDEPENDENT_AMBULATORY_CARE_PROVIDER_SITE_OTHER): Payer: Self-pay | Admitting: Bariatrics

## 2021-08-26 ENCOUNTER — Ambulatory Visit (INDEPENDENT_AMBULATORY_CARE_PROVIDER_SITE_OTHER): Payer: 59 | Admitting: Bariatrics

## 2021-08-26 ENCOUNTER — Other Ambulatory Visit: Payer: Self-pay

## 2021-08-26 VITALS — BP 151/92 | HR 100 | Temp 97.8°F | Ht 65.0 in | Wt 249.0 lb

## 2021-08-26 DIAGNOSIS — I10 Essential (primary) hypertension: Secondary | ICD-10-CM

## 2021-08-26 DIAGNOSIS — Z6839 Body mass index (BMI) 39.0-39.9, adult: Secondary | ICD-10-CM | POA: Diagnosis not present

## 2021-08-26 DIAGNOSIS — E038 Other specified hypothyroidism: Secondary | ICD-10-CM

## 2021-08-26 NOTE — Progress Notes (Signed)
Chief Complaint:   OBESITY Erin Warren is here to discuss her progress with her obesity treatment plan along with follow-up of her obesity related diagnoses. Erin Warren is on the Category 2 Plan and states she is following her eating plan approximately 50-60% of the time. Erin Warren states she is doing physical therapy for 15-20 minutes 7 times per week.  Today's visit was #: 40 Starting weight: 240 lbs Starting date: 06/28/2018 Today's weight: 249 lbs Today's date: 08/26/2021 Total lbs lost to date: 0 Total lbs lost since last in-office visit: 0  Interim History: Erin Warren is up 1 lb since her last visit. She is getting adequate water and protein. Her weight is up 4 lbs due to her cycle.   Subjective:   1. Essential hypertension Erin Warren ran out of Propranolol and HCTZ. Her blood pressure is elevated today.   2. Other specified hypothyroidism Erin Warren is currently taking Synthroid.  Assessment/Plan:   1. Essential hypertension Erin Warren will continue taking all her medications. She is working on healthy weight loss and exercise to improve blood pressure control. We will watch for signs of hypotension as she continues her lifestyle modifications.  2. Other specified hypothyroidism Erin Warren will continue taking her medications. Orders and follow up as documented in patient record.  Counseling Good thyroid control is important for overall health. Supratherapeutic thyroid levels are dangerous and will not improve weight loss results. Counseling: The correct way to take levothyroxine is fasting, with water, separated by at least 30 minutes from breakfast, and separated by more than 4 hours from calcium, iron, multivitamins, acid reflux medications (PPIs).    3. Obesity with current BMI of 41.5 Erin Warren is currently in the action stage of change. As such, her goal is to continue with weight loss efforts. She has agreed to the Category 2 Plan.   Erin Warren will continue meal planning and she will continue intentional  planning.  Exercise goals:  Erin Warren is doing physical therapy.  Behavioral modification strategies: increasing lean protein intake, decreasing simple carbohydrates, increasing vegetables, and increasing water intake.  Erin Warren has agreed to follow-up with our clinic in 4-6 weeks. She was informed of the importance of frequent follow-up visits to maximize her success with intensive lifestyle modifications for her multiple health conditions.   Objective:   Blood pressure (!) 151/92, pulse 100, temperature 97.8 F (36.6 C), height 5\' 5"  (1.651 m), weight 249 lb (112.9 kg), SpO2 96 %. Body mass index is 41.44 kg/m. Erin Warren has a cast on her left arm and elbow. She is doing exercises at home.  General: Cooperative, alert, well developed, in no acute distress. HEENT: Conjunctivae and lids unremarkable. Cardiovascular: Regular rhythm.  Lungs: Normal work of breathing. Neurologic: No focal deficits.   Lab Results  Component Value Date   CREATININE 0.86 12/25/2020   BUN 15 12/25/2020   NA 139 12/25/2020   K 4.6 12/25/2020   CL 100 12/25/2020   CO2 22 12/25/2020   Lab Results  Component Value Date   ALT 20 12/25/2020   AST 16 12/25/2020   ALKPHOS 106 12/25/2020   BILITOT 0.3 12/25/2020   Lab Results  Component Value Date   HGBA1C 5.2 12/25/2020   HGBA1C 5.2 12/13/2019   HGBA1C 5.1 11/14/2018   HGBA1C 5.2 06/28/2018   HGBA1C 4.7 09/27/2013   Lab Results  Component Value Date   INSULIN 14.9 12/25/2020   INSULIN 16.5 12/13/2019   INSULIN 20.4 11/14/2018   INSULIN 14.2 06/28/2018   Lab Results  Component Value  Date   TSH 2.430 12/25/2020   Lab Results  Component Value Date   CHOL 175 12/25/2020   HDL 54 12/25/2020   LDLCALC 82 12/25/2020   TRIG 235 (H) 12/25/2020   CHOLHDL 3.1 10/18/2017   Lab Results  Component Value Date   VD25OH 49.2 12/25/2020   VD25OH 50.3 12/13/2019   VD25OH 47.5 06/28/2018   Lab Results  Component Value Date   WBC 11.6 (H) 06/28/2018   HGB  14.8 06/28/2018   HCT 43.8 06/28/2018   MCV 90 06/28/2018   PLT 375.0 02/03/2018   No results found for: IRON, TIBC, FERRITIN  Attestation Statements:   Reviewed by clinician on day of visit: allergies, medications, problem list, medical history, surgical history, family history, social history, and previous encounter notes.  I, Lizbeth Bark, RMA, am acting as Location manager for [employer], DO.  I have reviewed the above documentation for accuracy and completeness, and I agree with the above. Jearld Lesch, DO

## 2021-09-16 ENCOUNTER — Other Ambulatory Visit (INDEPENDENT_AMBULATORY_CARE_PROVIDER_SITE_OTHER): Payer: Self-pay | Admitting: Family Medicine

## 2021-09-16 DIAGNOSIS — I10 Essential (primary) hypertension: Secondary | ICD-10-CM

## 2021-09-16 NOTE — Telephone Encounter (Signed)
Pt last seen by Dr. Brown.  

## 2021-09-16 NOTE — Telephone Encounter (Signed)
LAST APPOINTMENT DATE: 08/26/21 NEXT APPOINTMENT DATE: 09/28/21   [id number] - [address], [place] [address] Phone: [phone] Fax: [phone]  Patient is requesting a refill of the following medications: Pending Prescriptions:                       Disp   Refills   propranolol ER (INDERAL LA) 120 MG 24 hr c*30 cap*1       Sig: TAKE 1 CAPSULE BY MOUTH EVERY DAY   Date last filled: 08/10/21 Previously prescribed by Dr. Leafy Ro  Lab Results      Component                Value               Date                      HGBA1C                   5.2                 12/25/2020                HGBA1C                   5.2                 12/13/2019                HGBA1C                   5.1                 11/14/2018           Lab Results      Component                Value               Date                      LDLCALC                  82                  12/25/2020                CREATININE               0.86                12/25/2020           Lab Results      Component                Value               Date                      VD25OH                   49.2                12/25/2020                VD25OH  50.3                12/13/2019                VD25OH                   47.5                06/28/2018            BP Readings from Last 3 Encounters: 08/26/21 : (!) 151/92 07/15/21 : (!) 160/92 06/17/21 : (!) 166/104

## 2021-09-17 NOTE — Telephone Encounter (Signed)
Please review

## 2021-09-28 ENCOUNTER — Other Ambulatory Visit: Payer: Self-pay

## 2021-09-28 ENCOUNTER — Encounter (INDEPENDENT_AMBULATORY_CARE_PROVIDER_SITE_OTHER): Payer: Self-pay | Admitting: Bariatrics

## 2021-09-28 ENCOUNTER — Ambulatory Visit (INDEPENDENT_AMBULATORY_CARE_PROVIDER_SITE_OTHER): Payer: 59 | Admitting: Bariatrics

## 2021-09-28 VITALS — BP 151/100 | HR 84 | Temp 97.5°F | Ht 65.0 in | Wt 248.0 lb

## 2021-09-28 DIAGNOSIS — E559 Vitamin D deficiency, unspecified: Secondary | ICD-10-CM | POA: Diagnosis not present

## 2021-09-28 DIAGNOSIS — Z6841 Body Mass Index (BMI) 40.0 and over, adult: Secondary | ICD-10-CM | POA: Diagnosis not present

## 2021-09-28 DIAGNOSIS — I10 Essential (primary) hypertension: Secondary | ICD-10-CM | POA: Diagnosis not present

## 2021-09-28 DIAGNOSIS — Z6839 Body mass index (BMI) 39.0-39.9, adult: Secondary | ICD-10-CM

## 2021-09-28 NOTE — Progress Notes (Signed)
Chief Complaint:   OBESITY Erin Warren is here to discuss her progress with her obesity treatment plan along with follow-up of her obesity related diagnoses. Erin Warren is on the Category 2 Plan and states she is following her eating plan approximately 50% of the time. Erin Warren states she is doing physical therapy for 15 minutes 7 times per week.  Today's visit was #: 65 Starting weight: 240 lbs Starting date: 06/28/2018 Today's weight: 248 lbs Today's date: 09/28/2021 Total lbs lost to date: 0 Total lbs lost since last in-office visit: 1 lb  Interim History: Erin Warren is down 1 lb since her last visit. She is doing well with protein and water intake.  Subjective:   1. Essential hypertension Erin Warren is not taking her blood pressure at home. She did take her blood pressure last night. It was not controlled well. She is taking chlorthalidone and Inderal currently.  2. Vitamin D deficiency Erin Warren is taking OTC Vitamin D currently.  Assessment/Plan:   1. Essential hypertension Erin Warren will continue taking her medications. She will take her blood pressure at home. She is working on healthy weight loss and exercise to improve blood pressure control. We will watch for signs of hypotension as she continues her lifestyle modifications.  2. Vitamin D deficiency Low Vitamin D level contributes to fatigue and are associated with obesity, breast, and colon cancer. Erin Warren agrees to continue to take OTC Vitamin D 2,000 IU daily and she will follow-up for routine testing of Vitamin D, at least 2-3 times per year to avoid over-replacement.  3. Obesity, current BMI 40.1 Erin Warren is currently in the action stage of change. As such, her goal is to continue with weight loss efforts. She has agreed to the Category 2 Plan.   Erin Warren will continue meal planning and she will continue intentional eating.  Exercise goals:  Erin Warren is doing physical therapy at home. She will add walking exercise.  Behavioral modification  strategies: increasing lean protein intake, decreasing simple carbohydrates, increasing vegetables, increasing water intake, decreasing eating out, no skipping meals, meal planning and cooking strategies, keeping healthy foods in the home, and planning for success.  Erin Warren has agreed to follow-up with our clinic in 4 weeks. She was informed of the importance of frequent follow-up visits to maximize her success with intensive lifestyle modifications for her multiple health conditions.   Objective:   Blood pressure (!) 151/100, pulse 84, temperature (!) 97.5 F (36.4 C), height 5\' 5"  (1.651 m), weight 248 lb (112.5 kg), SpO2 96 %. Body mass index is 41.27 kg/m.  General: Cooperative, alert, well developed, in no acute distress. HEENT: Conjunctivae and lids unremarkable. Cardiovascular: Regular rhythm.  Lungs: Normal work of breathing. Neurologic: No focal deficits.   Lab Results  Component Value Date   CREATININE 0.86 12/25/2020   BUN 15 12/25/2020   NA 139 12/25/2020   K 4.6 12/25/2020   CL 100 12/25/2020   CO2 22 12/25/2020   Lab Results  Component Value Date   ALT 20 12/25/2020   AST 16 12/25/2020   ALKPHOS 106 12/25/2020   BILITOT 0.3 12/25/2020   Lab Results  Component Value Date   HGBA1C 5.2 12/25/2020   HGBA1C 5.2 12/13/2019   HGBA1C 5.1 11/14/2018   HGBA1C 5.2 06/28/2018   HGBA1C 4.7 09/27/2013   Lab Results  Component Value Date   INSULIN 14.9 12/25/2020   INSULIN 16.5 12/13/2019   INSULIN 20.4 11/14/2018   INSULIN 14.2 06/28/2018   Lab Results  Component Value  Date   TSH 2.430 12/25/2020   Lab Results  Component Value Date   CHOL 175 12/25/2020   HDL 54 12/25/2020   LDLCALC 82 12/25/2020   TRIG 235 (H) 12/25/2020   CHOLHDL 3.1 10/18/2017   Lab Results  Component Value Date   VD25OH 49.2 12/25/2020   VD25OH 50.3 12/13/2019   VD25OH 47.5 06/28/2018   Lab Results  Component Value Date   WBC 11.6 (H) 06/28/2018   HGB 14.8 06/28/2018   HCT 43.8  06/28/2018   MCV 90 06/28/2018   PLT 375.0 02/03/2018   No results found for: IRON, TIBC, FERRITIN  Attestation Statements:   Reviewed by clinician on day of visit: allergies, medications, problem list, medical history, surgical history, family history, social history, and previous encounter notes.  I, Lizbeth Bark, RMA, am acting as Location manager for [employer], DO.  I have reviewed the above documentation for accuracy and completeness, and I agree with the above. Jearld Lesch, DO

## 2021-10-07 ENCOUNTER — Encounter: Payer: Self-pay | Admitting: Diagnostic Neuroimaging

## 2021-10-07 ENCOUNTER — Ambulatory Visit: Payer: 59 | Admitting: Diagnostic Neuroimaging

## 2021-10-07 VITALS — BP 159/101 | Ht 65.0 in | Wt 252.2 lb

## 2021-10-07 DIAGNOSIS — R9089 Other abnormal findings on diagnostic imaging of central nervous system: Secondary | ICD-10-CM | POA: Diagnosis not present

## 2021-10-07 DIAGNOSIS — G43809 Other migraine, not intractable, without status migrainosus: Secondary | ICD-10-CM
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
## 2021-10-08 ENCOUNTER — Telehealth: Payer: Self-pay | Admitting: Diagnostic Neuroimaging

## 2021-10-08 ENCOUNTER — Other Ambulatory Visit: Payer: Self-pay | Admitting: Diagnostic Neuroimaging

## 2021-10-08 DIAGNOSIS — G43809 Other migraine, not intractable, without status migrainosus: Secondary | ICD-10-CM

## 2021-10-08 NOTE — Telephone Encounter (Signed)
[employer] order sent to [facility], they will obtain the auth and reach out to the patient to schedule.  ?

## 2021-10-26 ENCOUNTER — Ambulatory Visit (INDEPENDENT_AMBULATORY_CARE_PROVIDER_SITE_OTHER): Payer: 59 | Admitting: Bariatrics

## 2021-10-26 ENCOUNTER — Encounter (INDEPENDENT_AMBULATORY_CARE_PROVIDER_SITE_OTHER): Payer: Self-pay | Admitting: Bariatrics

## 2021-10-26 ENCOUNTER — Other Ambulatory Visit: Payer: Self-pay

## 2021-10-26 VITALS — BP 135/95 | HR 87 | Temp 98.0°F | Ht 65.0 in | Wt 254.0 lb

## 2021-10-26 DIAGNOSIS — E8881 Metabolic syndrome: Secondary | ICD-10-CM

## 2021-10-26 DIAGNOSIS — E038 Other specified hypothyroidism: Secondary | ICD-10-CM | POA: Diagnosis not present

## 2021-10-26 DIAGNOSIS — E669 Obesity, unspecified: Secondary | ICD-10-CM

## 2021-10-26 DIAGNOSIS — Z6841 Body Mass Index (BMI) 40.0 and over, adult: Secondary | ICD-10-CM

## 2021-10-26 NOTE — Progress Notes (Signed)
Chief Complaint:   OBESITY Erin Warren is here to discuss her progress with her obesity treatment plan along with follow-up of her obesity related diagnoses. Erin Warren is on the Category 2 Plan and states she is following her eating plan approximately 60% of the time. Erin Warren states she is doing physical therapy for 15 minutes 7 times per week.  Today's visit was #: 21 Starting weight: 240 lbs Starting date: 06/28/2018 Today's weight: 245 lbs Today's date: 10/26/2021 Total lbs lost to date: 0 Total lbs lost since last in-office visit: 0  Interim History: Erin Warren is up 2 lbs since her last visit. She is doing ok with her water and protein.  Subjective:   1. Other specified hypothyroidism Erin Warren is taking Synthroid, and she notes some fatigue.  2. Insulin resistance Erin Warren is taking metformin with no side effects.  Assessment/Plan:   1. Other specified hypothyroidism Erin Warren will continue Synthroid, and increase her exercise. Orders and follow up as documented in patient record.  Counseling Good thyroid control is important for overall health. Supratherapeutic thyroid levels are dangerous and will not improve weight loss results. Counseling: The correct way to take levothyroxine is fasting, with water, separated by at least 30 minutes from breakfast, and separated by more than 4 hours from calcium, iron, multivitamins, acid reflux medications (PPIs).   2. Insulin resistance Erin Warren will continue metformin, increase activities/exercise, and decreasing simple carbohydrates to help decrease the risk of diabetes. Erin Warren agreed to follow-up with [name] as directed to closely monitor her progress.  3. Obesity, current BMI 40.8 Erin Warren is currently in the action stage of change. As such, her goal is to continue with weight loss efforts. She has agreed to the Category 2 Plan or keeping a food journal and adhering to recommended goals of 1200 calories and 80-90 grams of protein daily.   Will journal, and eat  close to the goal of calories and protein.  Exercise goals: As is, and increase exercise.  Behavioral modification strategies: increasing lean protein intake, decreasing simple carbohydrates, increasing vegetables, increasing water intake, decreasing eating out, no skipping meals, meal planning and cooking strategies, keeping healthy foods in the home, and avoiding temptations.  Erin Warren has agreed to follow-up with our clinic in 4 weeks. She was informed of the importance of frequent follow-up visits to maximize her success with intensive lifestyle modifications for her multiple health conditions.   Objective:   Blood pressure (!) 135/95, pulse 87, temperature 98 F (36.7 C), height 5\' 5"  (1.651 m), weight 254 lb (115.2 kg), last menstrual period 10/23/2021, SpO2 97 %. Body mass index is 42.27 kg/m.  General: Cooperative, alert, well developed, in no acute distress. HEENT: Conjunctivae and lids unremarkable. Cardiovascular: Regular rhythm.  Lungs: Normal work of breathing. Neurologic: No focal deficits.   Lab Results  Component Value Date   CREATININE 0.86 12/25/2020   BUN 15 12/25/2020   NA 139 12/25/2020   K 4.6 12/25/2020   CL 100 12/25/2020   CO2 22 12/25/2020   Lab Results  Component Value Date   ALT 20 12/25/2020   AST 16 12/25/2020   ALKPHOS 106 12/25/2020   BILITOT 0.3 12/25/2020   Lab Results  Component Value Date   HGBA1C 5.2 12/25/2020   HGBA1C 5.2 12/13/2019   HGBA1C 5.1 11/14/2018   HGBA1C 5.2 06/28/2018   HGBA1C 4.7 09/27/2013   Lab Results  Component Value Date   INSULIN 14.9 12/25/2020   INSULIN 16.5 12/13/2019   INSULIN 20.4 11/14/2018  INSULIN 14.2 06/28/2018   Lab Results  Component Value Date   TSH 2.430 12/25/2020   Lab Results  Component Value Date   CHOL 175 12/25/2020   HDL 54 12/25/2020   LDLCALC 82 12/25/2020   TRIG 235 (H) 12/25/2020   CHOLHDL 3.1 10/18/2017   Lab Results  Component Value Date   VD25OH 49.2 12/25/2020    VD25OH 50.3 12/13/2019   VD25OH 47.5 06/28/2018   Lab Results  Component Value Date   WBC 11.6 (H) 06/28/2018   HGB 14.8 06/28/2018   HCT 43.8 06/28/2018   MCV 90 06/28/2018   PLT 375.0 02/03/2018   No results found for: IRON, TIBC, FERRITIN  Attestation Statements:   Reviewed by clinician on day of visit: allergies, medications, problem list, medical history, surgical history, family history, social history, and previous encounter notes.   Wilhemena Durie, am acting as Location manager for [employer], DO.  I have reviewed the above documentation for accuracy and completeness, and I agree with the above. Jearld Lesch, DO

## 2021-11-05 ENCOUNTER — Other Ambulatory Visit (INDEPENDENT_AMBULATORY_CARE_PROVIDER_SITE_OTHER): Payer: Self-pay | Admitting: Bariatrics

## 2021-11-05 DIAGNOSIS — I10 Essential (primary) hypertension: Secondary | ICD-10-CM

## 2021-11-23 ENCOUNTER — Encounter (INDEPENDENT_AMBULATORY_CARE_PROVIDER_SITE_OTHER): Payer: Self-pay | Admitting: Bariatrics

## 2021-11-23 ENCOUNTER — Other Ambulatory Visit: Payer: Self-pay

## 2021-11-23 ENCOUNTER — Ambulatory Visit (INDEPENDENT_AMBULATORY_CARE_PROVIDER_SITE_OTHER): Payer: 59 | Admitting: Bariatrics

## 2021-11-23 VITALS — BP 136/89 | HR 88 | Temp 97.6°F | Ht 65.0 in | Wt 252.0 lb

## 2021-11-23 DIAGNOSIS — Z6841 Body Mass Index (BMI) 40.0 and over, adult: Secondary | ICD-10-CM | POA: Diagnosis not present

## 2021-11-23 DIAGNOSIS — E669 Obesity, unspecified: Secondary | ICD-10-CM

## 2021-11-23 DIAGNOSIS — E8881 Metabolic syndrome: Secondary | ICD-10-CM

## 2021-11-23 DIAGNOSIS — I10 Essential (primary) hypertension: Secondary | ICD-10-CM

## 2021-11-23 MED ORDER — CHLORTHALIDONE 25 MG PO TABS: 25.0000 mg | ORAL_TABLET | Freq: Every day | ORAL | 0 refills | Status: DC

## 2021-11-25 ENCOUNTER — Encounter (INDEPENDENT_AMBULATORY_CARE_PROVIDER_SITE_OTHER): Payer: Self-pay | Admitting: Bariatrics

## 2021-11-25 NOTE — Progress Notes (Signed)
? ? ? ?Chief Complaint:  ? ?OBESITY ?Erin Warren is here to discuss her progress with her obesity treatment plan along with follow-up of her obesity related diagnoses. Erin Warren is on the Category 2 Plan and states she is following her eating plan approximately 50% of the time. Erin Warren states she is doing physical therapy for 15 minutes 7 times per week. ? ?Today's visit was #: 50 ?Starting weight: 240 lbs ?Starting date: 06/28/2018 ?Today's weight: 252 lbs ?Today's date: 11/23/2021 ?Total lbs lost to date: 0 ?Total lbs lost since last in-office visit:2 lbs ? ?Interim History: Erin Warren is down 2 lbs since her last visit. She is eating more balanced meats.  ? ?Subjective:  ? ?1. Essential hypertension ?Erin Warren's blood pressure is reasonably well controlled. Her last blood pressure was 135/95. ?  ?2. Insulin resistance ?Erin Warren is taking Metformin currently.  ? ?Assessment/Plan:  ? ?1. Essential hypertension ?We will refill chlorthalidone 25 mg for 3 months with no refills. Erin Warren is working on healthy weight loss and exercise to improve blood pressure control. We will watch for signs of hypotension as she continues her lifestyle modifications. ? ?- chlorthalidone (HYGROTON) 25 MG tablet; Take 1 tablet (25 mg total) by mouth daily.  Dispense: 90 tablet; Refill: 0 ? ?2. Insulin resistance ?Erin Warren will continue taking Metformin. She will continue to work on weight loss, exercise, and decreasing simple carbohydrates to help decrease the risk of diabetes. Erin Warren agreed to follow-up with [name] as directed to closely monitor her progress. ? ?3. Obesity, current BMI 42.0 ?Erin Warren is currently in the action stage of change. As such, her goal is to continue with weight loss efforts. She has agreed to the Category 2 Plan.  ? ?Erin Warren will adhere to her plan and calories 80-90%. She will be mindful eating. She will keep her water intake high. ? ?Exercise goals:  Erin Warren is moving more and more active overall. She is doing physical therapy for exercise.   ? ?Behavioral modification strategies: increasing lean protein intake, decreasing simple carbohydrates, increasing vegetables, increasing water intake, decreasing eating out, no skipping meals, meal planning and cooking strategies, keeping healthy foods in the home, and planning for success. ? ?Erin Warren has agreed to follow-up with our clinic in 4-5 weeks (fasting). She was informed of the importance of frequent follow-up visits to maximize her success with intensive lifestyle modifications for her multiple health conditions.  ? ?Objective:  ? ?Blood pressure 136/89, pulse 88, temperature 97.6 ?F (36.4 ?C), height '5\' 5"'$  (1.651 m), weight 252 lb (114.3 kg), SpO2 98 %. ?Body mass index is 41.93 kg/m?. ? ?General: Cooperative, alert, well developed, in no acute distress. ?HEENT: Conjunctivae and lids unremarkable. ?Cardiovascular: Regular rhythm.  ?Lungs: Normal work of breathing. ?Neurologic: No focal deficits.  ? ?Lab Results  ?Component Value Date  ? CREATININE 0.86 12/25/2020  ? BUN 15 12/25/2020  ? NA 139 12/25/2020  ? K 4.6 12/25/2020  ? CL 100 12/25/2020  ? CO2 22 12/25/2020  ? ?Lab Results  ?Component Value Date  ? ALT 20 12/25/2020  ? AST 16 12/25/2020  ? ALKPHOS 106 12/25/2020  ? BILITOT 0.3 12/25/2020  ? ?Lab Results  ?Component Value Date  ? HGBA1C 5.2 12/25/2020  ? HGBA1C 5.2 12/13/2019  ? HGBA1C 5.1 11/14/2018  ? HGBA1C 5.2 06/28/2018  ? HGBA1C 4.7 09/27/2013  ? ?Lab Results  ?Component Value Date  ? INSULIN 14.9 12/25/2020  ? INSULIN 16.5 12/13/2019  ? INSULIN 20.4 11/14/2018  ? INSULIN 14.2 06/28/2018  ? ?Lab Results  ?  Component Value Date  ? TSH 2.430 12/25/2020  ? ?Lab Results  ?Component Value Date  ? CHOL 175 12/25/2020  ? HDL 54 12/25/2020  ? Erin Warren 82 12/25/2020  ? TRIG 235 (H) 12/25/2020  ? CHOLHDL 3.1 10/18/2017  ? ?Lab Results  ?Component Value Date  ? VD25OH 49.2 12/25/2020  ? VD25OH 50.3 12/13/2019  ? VD25OH 47.5 06/28/2018  ? ?Lab Results  ?Component Value Date  ? WBC 11.6 (H) 06/28/2018  ?  HGB 14.8 06/28/2018  ? HCT 43.8 06/28/2018  ? MCV 90 06/28/2018  ? PLT 375.0 02/03/2018  ? ?No results found for: IRON, TIBC, FERRITIN ? ?Attestation Statements:  ? ?Reviewed by clinician on day of visit: allergies, medications, problem list, medical history, surgical history, family history, social history, and previous encounter notes. ? ?I, Lizbeth Bark, RMA, am acting as transcriptionist for [employer], DO. ? ?I have reviewed the above documentation for accuracy and completeness, and I agree with the above. Jearld Lesch, DO ? ?

## 2021-12-28 ENCOUNTER — Ambulatory Visit (INDEPENDENT_AMBULATORY_CARE_PROVIDER_SITE_OTHER): Payer: 59 | Admitting: Bariatrics

## 2022-01-26 ENCOUNTER — Ambulatory Visit (INDEPENDENT_AMBULATORY_CARE_PROVIDER_SITE_OTHER): Payer: 59 | Admitting: Bariatrics

## 2022-01-27 ENCOUNTER — Encounter (INDEPENDENT_AMBULATORY_CARE_PROVIDER_SITE_OTHER): Payer: Self-pay | Admitting: Family Medicine

## 2022-01-27 ENCOUNTER — Ambulatory Visit (INDEPENDENT_AMBULATORY_CARE_PROVIDER_SITE_OTHER): Payer: 59 | Admitting: Family Medicine

## 2022-01-27 VITALS — BP 138/89 | HR 87 | Temp 97.9°F | Ht 65.0 in | Wt 249.0 lb

## 2022-01-27 DIAGNOSIS — I10 Essential (primary) hypertension: Secondary | ICD-10-CM | POA: Diagnosis not present

## 2022-01-27 DIAGNOSIS — E669 Obesity, unspecified: Secondary | ICD-10-CM | POA: Diagnosis not present

## 2022-01-27 DIAGNOSIS — Z6841 Body Mass Index (BMI) 40.0 and over, adult: Secondary | ICD-10-CM

## 2022-01-27 DIAGNOSIS — Z9189 Other specified personal risk factors, not elsewhere classified: Secondary | ICD-10-CM

## 2022-01-27 MED ORDER — CHLORTHALIDONE 25 MG PO TABS: 25.0000 mg | ORAL_TABLET | Freq: Every day | ORAL | 0 refills | Status: DC

## 2022-02-05 NOTE — Progress Notes (Signed)
Chief Complaint:   OBESITY Erin Warren is here to discuss her progress with her obesity treatment plan along with follow-up of her obesity related diagnoses. Erin Warren is on the Category 2 Plan and states she is following her eating plan approximately 50% of the time. Erin Warren states she is not currently exercising.  Today's visit was #: 41 Starting weight: 240 lbs Starting date: 06/28/2018 Today's weight: 249 lbs Today's date: 01/27/2022 Total lbs lost to date: 0 Total lbs lost since last in-office visit: 3  Interim History: Erin Warren reports she is not eating out as much but is also not being healthy, as she is having ramen noodles and hot dogs for lunch.  Subjective:   1. Essential hypertension At goal. Medications: propranolol, chlorthalidone.   BP Readings from Last 3 Encounters:  01/27/22 138/89  11/23/21 136/89  10/26/21 (!) 135/95   Lab Results  Component Value Date   CREATININE 0.86 12/25/2020   2. At risk for malnutrition Erin Warren is at risk for malnutrition due to poor nutritional intake.  Assessment/Plan:  No orders of the defined types were placed in this encounter.   Medications Discontinued During This Encounter  Medication Reason   chlorthalidone (HYGROTON) 25 MG tablet Reorder     Meds ordered this encounter  Medications   chlorthalidone (HYGROTON) 25 MG tablet    Sig: Take 1 tablet (25 mg total) by mouth daily.    Dispense:  90 tablet    Refill:  0    Please offer generic     1. Essential hypertension Avoid buying foods that are: processed, frozen, or prepackaged to avoid excess salt. We will watch for signs of hypotension as she continues lifestyle modifications. Increase exercise.  Refill- chlorthalidone (HYGROTON) 25 MG tablet; Take 1 tablet (25 mg total) by mouth daily.  Dispense: 90 tablet; Refill: 0  2. At risk for malnutrition Erin Warren was given extensive malnutrition prevention education and counseling today of more than 9 minutes. Counseled her that  malnutrition refers to inappropriate nutrients or not the right balance of nutrients for optimal health. Discussed with Erin Warren that it is absolutely possible to be malnourished but yet obese. Risk factors, including but not limited to, inappropriate dietary choices, difficulty with obtaining food due to physical or financial limitations, and various physical and mental health conditions were reviewed with Erin Warren.   3. Obesity, current BMI 41.5 Erin Warren is currently in the action stage of change. As such, her goal is to continue with weight loss efforts. She has agreed to the Category 2 Plan.   Exercise goals:  Start exercising 10-15 minutes 3 days a week.  Behavioral modification strategies: increasing lean protein intake, decreasing simple carbohydrates, and planning for success.  Erin Warren has agreed to follow-up with our clinic in 5-6 weeks with Dr. Owens Shark. She was informed of the importance of frequent follow-up visits to maximize her success with intensive lifestyle modifications for her multiple health conditions.   Objective:   Blood pressure 138/89, pulse 87, temperature 97.9 F (36.6 C), height '5\' 5"'$  (1.651 m), weight 249 lb (112.9 kg), SpO2 97 %. Body mass index is 41.44 kg/m.  General: Cooperative, alert, well developed, in no acute distress. HEENT: Conjunctivae and lids unremarkable. Cardiovascular: Regular rhythm.  Lungs: Normal work of breathing. Neurologic: No focal deficits.   Lab Results  Component Value Date   CREATININE 0.86 12/25/2020   BUN 15 12/25/2020   NA 139 12/25/2020   K 4.6 12/25/2020   CL 100  12/25/2020   CO2 22 12/25/2020   Lab Results  Component Value Date   ALT 20 12/25/2020   AST 16 12/25/2020   ALKPHOS 106 12/25/2020   BILITOT 0.3 12/25/2020   Lab Results  Component Value Date   HGBA1C 5.2 12/25/2020   HGBA1C 5.2 12/13/2019   HGBA1C 5.1 11/14/2018   HGBA1C 5.2 06/28/2018   HGBA1C 4.7 09/27/2013   Lab Results  Component Value  Date   INSULIN 14.9 12/25/2020   INSULIN 16.5 12/13/2019   INSULIN 20.4 11/14/2018   INSULIN 14.2 06/28/2018   Lab Results  Component Value Date   TSH 2.430 12/25/2020   Lab Results  Component Value Date   CHOL 175 12/25/2020   HDL 54 12/25/2020   LDLCALC 82 12/25/2020   TRIG 235 (H) 12/25/2020   CHOLHDL 3.1 10/18/2017   Lab Results  Component Value Date   VD25OH 49.2 12/25/2020   VD25OH 50.3 12/13/2019   VD25OH 47.5 06/28/2018   Lab Results  Component Value Date   WBC 11.6 (H) 06/28/2018   HGB 14.8 06/28/2018   HCT 43.8 06/28/2018   MCV 90 06/28/2018   PLT 375.0 02/03/2018    Attestation Statements:   Reviewed by clinician on day of visit: allergies, medications, problem list, medical history, surgical history, family history, social history, and previous encounter notes.  I, Kathlene November, BS, CMA, am acting as transcriptionist for [employer], DO.  I have reviewed the above documentation for accuracy and completeness, and I agree with the above. Marjory Sneddon, D.O.  The [place] was signed into law in 2016 which includes the topic of electronic health records.  This provides immediate access to information in MyChart.  This includes consultation notes, operative notes, office notes, lab results and pathology reports.  If you have any questions about what you read please let us know at your next visit so we can discuss your concerns and take corrective action if need be.  We are right here with you.

## 2022-03-08 ENCOUNTER — Ambulatory Visit (INDEPENDENT_AMBULATORY_CARE_PROVIDER_SITE_OTHER): Payer: 59 | Admitting: Bariatrics

## 2022-03-08 ENCOUNTER — Encounter (INDEPENDENT_AMBULATORY_CARE_PROVIDER_SITE_OTHER): Payer: Self-pay | Admitting: Bariatrics

## 2022-03-08 VITALS — BP 137/91 | HR 85 | Temp 97.7°F | Ht 65.0 in | Wt 249.0 lb

## 2022-03-08 DIAGNOSIS — E669 Obesity, unspecified: Secondary | ICD-10-CM

## 2022-03-08 DIAGNOSIS — E559 Vitamin D deficiency, unspecified: Secondary | ICD-10-CM | POA: Diagnosis not present

## 2022-03-08 DIAGNOSIS — E8881 Metabolic syndrome: Secondary | ICD-10-CM

## 2022-03-08 DIAGNOSIS — E038 Other specified hypothyroidism: Secondary | ICD-10-CM | POA: Diagnosis not present

## 2022-03-08 DIAGNOSIS — Z6841 Body Mass Index (BMI) 40.0 and over, adult: Secondary | ICD-10-CM

## 2022-03-09 LAB — COMPREHENSIVE METABOLIC PANEL
ALT: 18 IU/L (ref 0–32)
AST: 14 IU/L (ref 0–40)
Albumin/Globulin Ratio: 1.3 (ref 1.2–2.2)
Albumin: 4.3 g/dL (ref 3.8–4.8)
Alkaline Phosphatase: 105 IU/L (ref 44–121)
BUN/Creatinine Ratio: 19 (ref 9–23)
BUN: 16 mg/dL (ref 6–24)
Bilirubin Total: 0.4 mg/dL (ref 0.0–1.2)
CO2: 22 mmol/L (ref 20–29)
Calcium: 10.7 mg/dL — ABNORMAL HIGH (ref 8.7–10.2)
Chloride: 97 mmol/L (ref 96–106)
Creatinine, Ser: 0.83 mg/dL (ref 0.57–1.00)
Globulin, Total: 3.3 g/dL (ref 1.5–4.5)
Glucose: 92 mg/dL (ref 70–99)
Potassium: 3.8 mmol/L (ref 3.5–5.2)
Sodium: 138 mmol/L (ref 134–144)
Total Protein: 7.6 g/dL (ref 6.0–8.5)
eGFR: 87 mL/min/{1.73_m2} (ref >59)

## 2022-03-09 LAB — VITAMIN D 25 HYDROXY (VIT D DEFICIENCY, FRACTURES): Vit D, 25-Hydroxy: 100 ng/mL (ref 30.0–100.0)

## 2022-03-09 LAB — TSH+T4F+T3FREE
Free T4: 1.45 ng/dL (ref 0.82–1.77)
T3, Free: 3.8 pg/mL (ref 2.0–4.4)
TSH: 4.55 u[IU]/mL — ABNORMAL HIGH (ref 0.450–4.500)

## 2022-03-09 LAB — LIPID PANEL WITH LDL/HDL RATIO
Cholesterol, Total: 155 mg/dL (ref 100–199)
HDL: 55 mg/dL (ref >39)
LDL Chol Calc (NIH): 70 mg/dL (ref 0–99)
LDL/HDL Ratio: 1.3 ratio (ref 0.0–3.2)
Triglycerides: 182 mg/dL — ABNORMAL HIGH (ref 0–149)
VLDL Cholesterol Cal: 30 mg/dL (ref 5–40)

## 2022-03-09 LAB — HEMOGLOBIN A1C
Est. average glucose Bld gHb Est-mCnc: 111 mg/dL
Hgb A1c MFr Bld: 5.5 % (ref 4.8–5.6)

## 2022-03-09 LAB — INSULIN, RANDOM: INSULIN: 28.3 u[IU]/mL — ABNORMAL HIGH (ref 2.6–24.9)

## 2022-03-09 NOTE — Progress Notes (Signed)
Chief Complaint:   OBESITY Erin Warren is here to discuss her progress with her obesity treatment plan along with follow-up of her obesity related diagnoses. Erin Warren is on the Category 2 Plan and states she is following her eating plan approximately 50% of the time. Erin Warren states she is doing 0 minutes 0 times per week.  Today's visit was #: 56 Starting weight: 240 lbs Starting date: 06/28/2018 Today's weight: 249 lbs Today's date: 03/08/2022 Total lbs lost to date: 0 Total lbs lost since last in-office visit: 0  Interim History: Erin Warren's weight remains the same since her last visit.  She is working partially at home and in the office.    Subjective:   1. Insulin resistance Erin Warren is not on medications currently.  2. Other specified hypothyroidism Erin Warren notes her energy level has been "so-so".  3. Vitamin D deficiency Erin Warren is currently taking OTC vitamin D 2,000 IU each day. She denies nausea, vomiting or muscle weakness.  Assessment/Plan:   1. Insulin resistance We will check labs today.  Erin Warren will keep her starches and sweets low.  We will follow-up at her next visit.  - Insulin, random - Hemoglobin A1c  2. Other specified hypothyroidism We will check labs today.  Erin Warren will keep her exercise up.  We will follow-up at her next visit.  - TSH+T4F+T3Free - Lipid Panel With LDL/HDL Ratio - Comprehensive metabolic panel  3. Vitamin D deficiency We will check labs today, and we will follow-up at Erin Warren's next visit.  - VITAMIN D 25 Hydroxy (Vit-D Deficiency, Fractures)  4. Obesity, current BMI 41.4 Erin Warren is currently in the action stage of change. As such, her goal is to continue with weight loss efforts. She has agreed to the Category 2 Plan.   Meal planning and mindful eating were discussed.  Snack sheet was given.  She will do soup and fruit to start the meal.  Exercise goals: Try to do more exercise.  Behavioral modification strategies: increasing lean protein intake,  decreasing simple carbohydrates, increasing vegetables, increasing water intake, decreasing eating out, no skipping meals, meal planning and cooking strategies, keeping healthy foods in the home, and planning for success.  Erin Warren has agreed to follow-up with our clinic in 4 weeks. She was informed of the importance of frequent follow-up visits to maximize her success with intensive lifestyle modifications for her multiple health conditions.   Erin Warren was informed we would discuss her lab results at her next visit unless there is a critical issue that needs to be addressed sooner. Erin Warren agreed to keep her next visit at the agreed upon time to discuss these results.  Objective:   Blood pressure (!) 137/91, pulse 85, temperature 97.7 F (36.5 C), height 5\' 5"  (1.651 m), weight 249 lb (112.9 kg), SpO2 95 %. Body mass index is 41.44 kg/m.  General: Cooperative, alert, well developed, in no acute distress. HEENT: Conjunctivae and lids unremarkable. Cardiovascular: Regular rhythm.  Lungs: Normal work of breathing. Neurologic: No focal deficits.   Lab Results  Component Value Date   CREATININE 0.83 03/08/2022   BUN 16 03/08/2022   NA 138 03/08/2022   K 3.8 03/08/2022   CL 97 03/08/2022   CO2 22 03/08/2022   Lab Results  Component Value Date   ALT 18 03/08/2022   AST 14 03/08/2022   ALKPHOS 105 03/08/2022   BILITOT 0.4 03/08/2022   Lab Results  Component Value Date   HGBA1C 5.5 03/08/2022   HGBA1C 5.2 12/25/2020   HGBA1C  5.2 12/13/2019   HGBA1C 5.1 11/14/2018   HGBA1C 5.2 06/28/2018   Lab Results  Component Value Date   INSULIN 28.3 (H) 03/08/2022   INSULIN 14.9 12/25/2020   INSULIN 16.5 12/13/2019   INSULIN 20.4 11/14/2018   INSULIN 14.2 06/28/2018   Lab Results  Component Value Date   TSH 4.550 (H) 03/08/2022   Lab Results  Component Value Date   CHOL 155 03/08/2022   HDL 55 03/08/2022   LDLCALC 70 03/08/2022   TRIG 182 (H) 03/08/2022   CHOLHDL 3.1 10/18/2017    Lab Results  Component Value Date   VD25OH 100.0 03/08/2022   VD25OH 49.2 12/25/2020   VD25OH 50.3 12/13/2019   Lab Results  Component Value Date   WBC 11.6 (H) 06/28/2018   HGB 14.8 06/28/2018   HCT 43.8 06/28/2018   MCV 90 06/28/2018   PLT 375.0 02/03/2018   No results found for: "IRON", "TIBC", "FERRITIN"  Attestation Statements:   Reviewed by clinician on day of visit: allergies, medications, problem list, medical history, surgical history, family history, social history, and previous encounter notes.   Trude Mcburney, am acting as Energy manager for [employer], DO.  I have reviewed the above documentation for accuracy and completeness, and I agree with the above. - .mec

## 2022-03-11 ENCOUNTER — Encounter (INDEPENDENT_AMBULATORY_CARE_PROVIDER_SITE_OTHER): Payer: Self-pay | Admitting: Bariatrics

## 2022-03-11 ENCOUNTER — Encounter (INDEPENDENT_AMBULATORY_CARE_PROVIDER_SITE_OTHER): Payer: Self-pay

## 2022-03-13 ENCOUNTER — Other Ambulatory Visit (INDEPENDENT_AMBULATORY_CARE_PROVIDER_SITE_OTHER): Payer: Self-pay | Admitting: Bariatrics

## 2022-03-13 DIAGNOSIS — I10 Essential (primary) hypertension: Secondary | ICD-10-CM

## 2022-04-05 ENCOUNTER — Encounter (INDEPENDENT_AMBULATORY_CARE_PROVIDER_SITE_OTHER): Payer: Self-pay | Admitting: Bariatrics

## 2022-04-05 ENCOUNTER — Ambulatory Visit (INDEPENDENT_AMBULATORY_CARE_PROVIDER_SITE_OTHER): Payer: 59 | Admitting: Bariatrics

## 2022-04-05 VITALS — BP 127/84 | HR 87 | Temp 97.9°F | Ht 65.0 in | Wt 251.0 lb

## 2022-04-05 DIAGNOSIS — E669 Obesity, unspecified: Secondary | ICD-10-CM | POA: Diagnosis not present

## 2022-04-05 DIAGNOSIS — I10 Essential (primary) hypertension: Secondary | ICD-10-CM

## 2022-04-05 DIAGNOSIS — Z6841 Body Mass Index (BMI) 40.0 and over, adult: Secondary | ICD-10-CM

## 2022-04-05 DIAGNOSIS — E8881 Metabolic syndrome: Secondary | ICD-10-CM

## 2022-04-05 MED ORDER — PROPRANOLOL HCL ER 120 MG PO CP24: ORAL_CAPSULE | ORAL | 0 refills | Status: DC

## 2022-04-14 ENCOUNTER — Encounter (INDEPENDENT_AMBULATORY_CARE_PROVIDER_SITE_OTHER): Payer: Self-pay

## 2022-04-14 ENCOUNTER — Encounter (INDEPENDENT_AMBULATORY_CARE_PROVIDER_SITE_OTHER): Payer: Self-pay | Admitting: Bariatrics

## 2022-04-14 NOTE — Progress Notes (Signed)
Chief Complaint:   OBESITY Erin Warren is here to discuss her progress with her obesity treatment plan along with follow-up of her obesity related diagnoses. Erin Warren is on the Category 2 Plan and states she is following her eating plan approximately 50% of the time. Erin Warren states she is doing 0 minutes 0 times per week.  Today's visit was #: 40 Starting weight: 240 lbs Starting date: 06/28/2018 Today's weight: 251 lbs Today's date: 04/05/2022 Total lbs lost to date: 0 Total lbs lost since last in-office visit: 0  Interim History: Erin Warren is up 2 lbs since her last visit. She is doing new recipes. She is getting adequate protein.   Subjective:   1. Essential hypertension Erin Warren notes she is sleeping ok. She has a sleep tracker.   2. Insulin resistance Erin Warren is taking metformin.   Assessment/Plan:   1. Essential hypertension Erin Warren will continue propranolol ER 120 mg once daily, and we will refill for 90 days.   - propranolol ER (INDERAL LA) 120 MG 24 hr capsule; TAKE 1 CAPSULE BY MOUTH EVERY DAY  Dispense: 90 capsule; Refill: 0  2. Insulin resistance Erin Warren will continue metformin, and she will keep her carbohydrates low.   3. Obesity, current BMI 41.8 Erin Warren is currently in the action stage of change. As such, her goal is to continue with weight loss efforts. She has agreed to the Category 2 Plan and keeping a food journal and adhering to recommended goals of 1200 calories and 80-90 grams of protein daily.   Intentional eating was discussed. She is to keep water intake high, and decrease snacking.   Exercise goals: No exercise has been prescribed at this time, due to left shoulder pain. Will resume exercise once better..  Behavioral modification strategies: increasing lean protein intake, decreasing simple carbohydrates, increasing vegetables, increasing water intake, decreasing eating out, no skipping meals, meal planning and cooking strategies, keeping healthy foods in the home, and  planning for success.  Erin Warren has agreed to follow-up with our clinic in 4 to 5 weeks. She was informed of the importance of frequent follow-up visits to maximize her success with intensive lifestyle modifications for her multiple health conditions.   Objective:   Blood pressure 127/84, pulse 87, temperature 97.9 F (36.6 C), height '5\' 5"'$  (1.651 m), weight 251 lb (113.9 kg), SpO2 96 %. Body mass index is 41.77 kg/m.  General: Cooperative, alert, well developed, in no acute distress. HEENT: Conjunctivae and lids unremarkable. Cardiovascular: Regular rhythm.  Lungs: Normal work of breathing. Neurologic: No focal deficits.   Lab Results  Component Value Date   CREATININE 0.83 03/08/2022   BUN 16 03/08/2022   NA 138 03/08/2022   K 3.8 03/08/2022   CL 97 03/08/2022   CO2 22 03/08/2022   Lab Results  Component Value Date   ALT 18 03/08/2022   AST 14 03/08/2022   ALKPHOS 105 03/08/2022   BILITOT 0.4 03/08/2022   Lab Results  Component Value Date   HGBA1C 5.5 03/08/2022   HGBA1C 5.2 12/25/2020   HGBA1C 5.2 12/13/2019   HGBA1C 5.1 11/14/2018   HGBA1C 5.2 06/28/2018   Lab Results  Component Value Date   INSULIN 28.3 (H) 03/08/2022   INSULIN 14.9 12/25/2020   INSULIN 16.5 12/13/2019   INSULIN 20.4 11/14/2018   INSULIN 14.2 06/28/2018   Lab Results  Component Value Date   TSH 4.550 (H) 03/08/2022   Lab Results  Component Value Date   CHOL 155 03/08/2022   HDL 55  03/08/2022   LDLCALC 70 03/08/2022   TRIG 182 (H) 03/08/2022   CHOLHDL 3.1 10/18/2017   Lab Results  Component Value Date   VD25OH 100.0 03/08/2022   VD25OH 49.2 12/25/2020   VD25OH 50.3 12/13/2019   Lab Results  Component Value Date   WBC 11.6 (H) 06/28/2018   HGB 14.8 06/28/2018   HCT 43.8 06/28/2018   MCV 90 06/28/2018   PLT 375.0 02/03/2018   No results found for: "IRON", "TIBC", "FERRITIN"  Attestation Statements:   Reviewed by clinician on day of visit: allergies, medications, problem  list, medical history, surgical history, family history, social history, and previous encounter notes.   Wilhemena Durie, am acting as Location manager for [employer], DO.  I have reviewed the above documentation for accuracy and completeness, and I agree with the above. Jearld Lesch, DO

## 2022-05-06 ENCOUNTER — Encounter (INDEPENDENT_AMBULATORY_CARE_PROVIDER_SITE_OTHER): Payer: Self-pay | Admitting: Bariatrics

## 2022-05-06 ENCOUNTER — Ambulatory Visit (INDEPENDENT_AMBULATORY_CARE_PROVIDER_SITE_OTHER): Payer: 59 | Admitting: Bariatrics

## 2022-05-06 VITALS — BP 126/85 | HR 84 | Temp 97.9°F | Ht 65.0 in | Wt 248.0 lb

## 2022-05-06 DIAGNOSIS — E669 Obesity, unspecified: Secondary | ICD-10-CM | POA: Diagnosis not present

## 2022-05-06 DIAGNOSIS — E8881 Metabolic syndrome: Secondary | ICD-10-CM

## 2022-05-06 DIAGNOSIS — Z6841 Body Mass Index (BMI) 40.0 and over, adult: Secondary | ICD-10-CM

## 2022-05-06 DIAGNOSIS — E88819 Insulin resistance, unspecified: Secondary | ICD-10-CM

## 2022-05-06 DIAGNOSIS — I10 Essential (primary) hypertension: Secondary | ICD-10-CM

## 2022-05-06 MED ORDER — CHLORTHALIDONE 25 MG PO TABS: 25.0000 mg | ORAL_TABLET | Freq: Every day | ORAL | 0 refills | Status: DC

## 2022-05-15 NOTE — Progress Notes (Unsigned)
Chief Complaint:   OBESITY Erin Warren is here to discuss her progress with her obesity treatment plan along with follow-up of her obesity related diagnoses. Erin Warren is on the Category 2 Plan and states she is following her eating plan approximately 60% of the time. Erin Warren states she is not exercising.  Today's visit was #: 31 Starting weight: 240 lbs Starting date: 06/28/2018 Today's weight: 248 lbs Today's date: 05/06/2022 Total lbs lost to date: 0 Total lbs lost since last in-office visit: 3  Interim History: Erin Warren is down 3 pounds since her last visit. She is getting in her water and protein.  Subjective:   1. Essential hypertension Erin Warren's blood pressure is controlled.  2. Insulin resistance Erin Warren is taking metformin.  Assessment/Plan:   1. Essential hypertension Erin Warren agrees to continue taking chlorthalidone '25mg'$  and will follow up as directed.  - chlorthalidone (HYGROTON) 25 MG tablet; Take 1 tablet (25 mg total) by mouth daily.  Dispense: 90 tablet; Refill: 0  2. Insulin resistance Erin Warren agrees to continue taking metformin and follow up at the next visit.  3. Obesity, current BMI 41.3 Erin Warren agrees to meal planning and to increase exercise. She will adhere to the plan 80 to 90 percent. Erin Warren agrees to read the book "Intermittent Fasting for Women" and we will discuss at her next visit.  Erin Warren is currently in the action stage of change. As such, her goal is to continue with weight loss efforts. She has agreed to keeping a food journal and adhering to recommended goals of 1200 calories and 80 to 90 grams of protein.   Exercise goals: No exercise has been prescribed at this time.  Behavioral modification strategies: increasing lean protein intake, decreasing simple carbohydrates, increasing vegetables, increasing water intake, decreasing eating out, no skipping meals, meal planning and cooking strategies, keeping healthy foods in the home, and planning for success.  Erin Warren  has agreed to follow-up with our clinic in 4 weeks. She was informed of the importance of frequent follow-up visits to maximize her success with intensive lifestyle modifications for her multiple health conditions.   Objective:   Blood pressure 126/85, pulse 84, temperature 97.9 F (36.6 C), height '5\' 5"'$  (1.651 m), weight 248 lb (112.5 kg), SpO2 98 %. Body mass index is 41.27 kg/m.  General: Cooperative, alert, well developed, in no acute distress. HEENT: Conjunctivae and lids unremarkable. Cardiovascular: Regular rhythm.  Lungs: Normal work of breathing. Neurologic: No focal deficits.   Lab Results  Component Value Date   CREATININE 0.83 03/08/2022   BUN 16 03/08/2022   NA 138 03/08/2022   K 3.8 03/08/2022   CL 97 03/08/2022   CO2 22 03/08/2022   Lab Results  Component Value Date   ALT 18 03/08/2022   AST 14 03/08/2022   ALKPHOS 105 03/08/2022   BILITOT 0.4 03/08/2022   Lab Results  Component Value Date   HGBA1C 5.5 03/08/2022   HGBA1C 5.2 12/25/2020   HGBA1C 5.2 12/13/2019   HGBA1C 5.1 11/14/2018   HGBA1C 5.2 06/28/2018   Lab Results  Component Value Date   INSULIN 28.3 (H) 03/08/2022   INSULIN 14.9 12/25/2020   INSULIN 16.5 12/13/2019   INSULIN 20.4 11/14/2018   INSULIN 14.2 06/28/2018   Lab Results  Component Value Date   TSH 4.550 (H) 03/08/2022   Lab Results  Component Value Date   CHOL 155 03/08/2022   HDL 55 03/08/2022   LDLCALC 70 03/08/2022   TRIG 182 (H) 03/08/2022   CHOLHDL  3.1 10/18/2017   Lab Results  Component Value Date   VD25OH 100.0 03/08/2022   VD25OH 49.2 12/25/2020   VD25OH 50.3 12/13/2019   Lab Results  Component Value Date   WBC 11.6 (H) 06/28/2018   HGB 14.8 06/28/2018   HCT 43.8 06/28/2018   MCV 90 06/28/2018   PLT 375.0 02/03/2018   No results found for: "IRON", "TIBC", "FERRITIN"  Attestation Statements:   Reviewed by clinician on day of visit: allergies, medications, problem list, medical history, surgical  history, family history, social history, and previous encounter notes.  I, Marcille Blanco, CMA, am acting as Location manager for [employer]. Owens Shark, DO  I have reviewed the above documentation for accuracy and completeness, and I agree with the above. Jearld Lesch, DO

## 2022-05-17 ENCOUNTER — Encounter (INDEPENDENT_AMBULATORY_CARE_PROVIDER_SITE_OTHER): Payer: Self-pay | Admitting: Bariatrics

## 2022-06-07 ENCOUNTER — Ambulatory Visit (INDEPENDENT_AMBULATORY_CARE_PROVIDER_SITE_OTHER): Payer: 59 | Admitting: Family Medicine

## 2022-06-07 ENCOUNTER — Encounter (INDEPENDENT_AMBULATORY_CARE_PROVIDER_SITE_OTHER): Payer: Self-pay | Admitting: Family Medicine

## 2022-06-07 VITALS — BP 161/93 | HR 82 | Temp 97.7°F | Ht 65.0 in | Wt 245.0 lb

## 2022-06-07 DIAGNOSIS — E88819 Insulin resistance, unspecified: Secondary | ICD-10-CM | POA: Diagnosis not present

## 2022-06-07 DIAGNOSIS — E559 Vitamin D deficiency, unspecified: Secondary | ICD-10-CM | POA: Diagnosis not present

## 2022-06-07 DIAGNOSIS — I10 Essential (primary) hypertension: Secondary | ICD-10-CM

## 2022-06-07 DIAGNOSIS — F3289 Other specified depressive episodes: Secondary | ICD-10-CM

## 2022-06-07 DIAGNOSIS — Z6841 Body Mass Index (BMI) 40.0 and over, adult: Secondary | ICD-10-CM

## 2022-06-07 DIAGNOSIS — E669 Obesity, unspecified: Secondary | ICD-10-CM

## 2022-06-10 NOTE — Progress Notes (Signed)
Chief Complaint:   OBESITY Erin Warren is here to discuss her progress with her obesity treatment plan along with follow-up of her obesity related diagnoses. Erin Warren is on agreed to keeping a food journal and adhering to recommended goals of 1200 calories and 80 to 90 grams of protein.  and states she is following her eating plan approximately 50% of the time. Erin Warren states she has not been exercising.  Today's visit was #: 5 Starting weight: 240 lbs Starting date: 06/28/2018 Today's weight: 245 lbs Today's date: 06/07/22 Total lbs lost to date: 0 Total lbs lost since last in-office visit: -3  Interim History: She started intermittent fasting per Dr. Owens Shark.  She plans to start 24-hour fasting 3 times a week.  Her boyfriend is supportive.  She is tracking calorie intake inconsistently.  She is thinking about doing yoga or tai chi.  Net weight gain of 5 pounds in 4 years since starting this program.  Subjective:   1. Essential hypertension Blood pressure elevated today, has blood pressure cuff at home-not checking. On chlorthalidone 25 mg daily and propranolol 120 mg ER daily (also for migraines).  2. Insulin resistance Tolerating metformin 500 mg twice daily-skipping on fasting days.  3. Vitamin D deficiency Vitamin D level upper limit normal in July at 100. Taking over-the-counter vitamin D 2000 IU daily.  4. Other depression, with emotional eating Started Wellbutrin 150 mg XL-helping with emotional eating.  Assessment/Plan:   1. Essential hypertension Continue current blood pressure medications.  2. Insulin resistance Continue metformin per PCP Continue to actively work on reducing intake of sugar.  3. Vitamin D deficiency Hold vitamin D for 1 month.  4. Other depression, with emotional eating Continue Wellbutrin XL 150 mg every morning per PCP.  5. Obesity, current BMI 40.9 1.  Reviewed overall progress. 2.  Okay to start intermittent fasting per Dr. Owens Shark.  Erin Warren  is currently in the action stage of change. As such, her goal is to continue with weight loss efforts. She has agreed to agreed to keeping a food journal and adhering to recommended goals of 1200 calories and 80 to 90 grams of protein.  Start intermittent fasting per Dr. Maeola Sarah about dizziness, headaches.  Exercise goals: Add in yoga/walking/tai chi 3 times per week.  Behavioral modification strategies: increasing lean protein intake, increasing vegetables, increasing water intake, decreasing eating out, no skipping meals, keeping healthy foods in the home, better snacking choices, and decreasing junk food.  Erin Warren has agreed to follow-up with our clinic in 4 weeks. She was informed of the importance of frequent follow-up visits to maximize her success with intensive lifestyle modifications for her multiple health conditions.    Objective:   Blood pressure (!) 161/93, pulse 82, temperature 97.7 F (36.5 C), height '5\' 5"'  (1.651 m), weight 245 lb (111.1 kg), SpO2 97 %. Body mass index is 40.77 kg/m.  General: Cooperative, alert, well developed, in no acute distress. HEENT: Conjunctivae and lids unremarkable. Cardiovascular: Regular rhythm.  Lungs: Normal work of breathing. Neurologic: No focal deficits.   Lab Results  Component Value Date   CREATININE 0.83 03/08/2022   BUN 16 03/08/2022   NA 138 03/08/2022   K 3.8 03/08/2022   CL 97 03/08/2022   CO2 22 03/08/2022   Lab Results  Component Value Date   ALT 18 03/08/2022   AST 14 03/08/2022   ALKPHOS 105 03/08/2022   BILITOT 0.4 03/08/2022   Lab Results  Component Value Date   HGBA1C 5.5  03/08/2022   HGBA1C 5.2 12/25/2020   HGBA1C 5.2 12/13/2019   HGBA1C 5.1 11/14/2018   HGBA1C 5.2 06/28/2018   Lab Results  Component Value Date   INSULIN 28.3 (H) 03/08/2022   INSULIN 14.9 12/25/2020   INSULIN 16.5 12/13/2019   INSULIN 20.4 11/14/2018   INSULIN 14.2 06/28/2018   Lab Results  Component Value Date   TSH 4.550  (H) 03/08/2022   Lab Results  Component Value Date   CHOL 155 03/08/2022   HDL 55 03/08/2022   LDLCALC 70 03/08/2022   TRIG 182 (H) 03/08/2022   CHOLHDL 3.1 10/18/2017   Lab Results  Component Value Date   VD25OH 100.0 03/08/2022   VD25OH 49.2 12/25/2020   VD25OH 50.3 12/13/2019   Lab Results  Component Value Date   WBC 11.6 (H) 06/28/2018   HGB 14.8 06/28/2018   HCT 43.8 06/28/2018   MCV 90 06/28/2018   PLT 375.0 02/03/2018   No results found for: "IRON", "TIBC", "FERRITIN"  Attestation Statements:   Reviewed by clinician on day of visit: allergies, medications, problem list, medical history, surgical history, family history, social history, and previous encounter notes.  I, Georgianne Fick, FNP, am acting as transcriptionist for Dr. Loyal Gambler.  I have reviewed the above documentation for accuracy and completeness, and I agree with the above. Dell Ponto, DO

## 2022-07-06 ENCOUNTER — Other Ambulatory Visit (INDEPENDENT_AMBULATORY_CARE_PROVIDER_SITE_OTHER): Payer: Self-pay | Admitting: Bariatrics

## 2022-07-06 DIAGNOSIS — I10 Essential (primary) hypertension: Secondary | ICD-10-CM

## 2022-07-08 ENCOUNTER — Ambulatory Visit (INDEPENDENT_AMBULATORY_CARE_PROVIDER_SITE_OTHER): Payer: 59 | Admitting: Family Medicine

## 2022-07-08 ENCOUNTER — Encounter (INDEPENDENT_AMBULATORY_CARE_PROVIDER_SITE_OTHER): Payer: Self-pay | Admitting: Family Medicine

## 2022-07-08 VITALS — BP 148/98 | HR 89 | Temp 97.9°F | Ht 65.0 in | Wt 238.0 lb

## 2022-07-08 DIAGNOSIS — Z6839 Body mass index (BMI) 39.0-39.9, adult: Secondary | ICD-10-CM

## 2022-07-08 DIAGNOSIS — E559 Vitamin D deficiency, unspecified: Secondary | ICD-10-CM | POA: Diagnosis not present

## 2022-07-08 DIAGNOSIS — I10 Essential (primary) hypertension: Secondary | ICD-10-CM | POA: Diagnosis not present

## 2022-07-08 DIAGNOSIS — E669 Obesity, unspecified: Secondary | ICD-10-CM | POA: Diagnosis not present

## 2022-07-08 DIAGNOSIS — F329 Major depressive disorder, single episode, unspecified: Secondary | ICD-10-CM

## 2022-07-08 MED ORDER — PROPRANOLOL HCL ER 120 MG PO CP24: ORAL_CAPSULE | ORAL | 0 refills | Status: DC

## 2022-07-21 DIAGNOSIS — F329 Major depressive disorder, single episode, unspecified: Secondary | ICD-10-CM | POA: Insufficient documentation

## 2022-07-21 NOTE — Progress Notes (Unsigned)
Chief Complaint:   OBESITY Erin Warren is here to discuss her progress with her obesity treatment plan along with follow-up of her obesity related diagnoses. Erin Warren is on keeping a food journal and adhering to recommended goals of 1200 calories and 80-90 protein and states she is following her eating plan approximately 75-80% of the time. Erin Warren states she is not exercising.   Today's visit was #: 75 Starting weight: 240 lbs Starting date: 06/28/2018 Today's weight: 238 lbs Today's date: 07/07/2022 Total lbs lost to date: 2 lbs Total lbs lost since last in-office visit: 7 lbs  Interim History: Has had a few trips between visits.  Paused intermittent fasting with travel.  While at home, doing 24 hours fasts 3 times per week (M, W, F).  She tried bone broth and increased water intake.  Not motivated to go the gym.  Has home exercises to do at home.   Subjective:   1. Vitamin D deficiency Paused Vitamin D.  Last level 100.  She denies GI upset.  2. Essential hypertension Blood pressure elevated today.  Muscle soreness today.  She is on chlorthalidone 25 mg daily.  She is taking propranolol for concurrent migraines.   3. Major depressive disorder, remission status unspecified, unspecified whether recurrent PCP has increased Wellbutrin to 300 mg XL daily with has helped mood.   Assessment/Plan:   1. Vitamin D deficiency Start OTC Vitamin D 2,000 IU daily for maintenance.   2. Essential hypertension Follow up with PCP for hypertension. Refill propranolol, although we dicussed that it is a weight gainer.   Refill - propranolol ER (INDERAL LA) 120 MG 24 hr capsule; TAKE 1 CAPSULE BY MOUTH EVERY DAY  Dispense: 90 capsule; Refill: 0  3. Major depressive disorder, remission status unspecified, unspecified whether recurrent Continue Wellbutrin XL 300 mg daily per PCP.   4. Obesity, current BMI 39.7 1) Start home workouts 2 times per week when able to work remotely.  2) Aim for the gym  one time per week.   Erin Warren is currently in the action stage of change. As such, her goal is to continue with weight loss efforts. She has agreed to keeping a food journal and adhering to recommended goals of 1200 calories and 80-90 protein daily.   Exercise goals: All adults should avoid inactivity. Some physical activity is better than none, and adults who participate in any amount of physical activity gain some health benefits.  Behavioral modification strategies: increasing lean protein intake, increasing vegetables, increasing water intake, decreasing eating out, no skipping meals, meal planning and cooking strategies, keeping healthy foods in the home, better snacking choices, and decreasing junk food.  Erin Warren has agreed to follow-up with our clinic in 4 weeks. She was informed of the importance of frequent follow-up visits to maximize her success with intensive lifestyle modifications for her multiple health conditions.   Objective:   Blood pressure (!) 148/98, pulse 89, temperature 97.9 F (36.6 C), height '5\' 5"'$  (1.651 m), weight 238 lb (108 kg), SpO2 98 %. Body mass index is 39.61 kg/m.  General: Cooperative, alert, well developed, in no acute distress. HEENT: Conjunctivae and lids unremarkable. Cardiovascular: Regular rhythm.  Lungs: Normal work of breathing. Neurologic: No focal deficits.   Lab Results  Component Value Date   CREATININE 0.83 03/08/2022   BUN 16 03/08/2022   NA 138 03/08/2022   K 3.8 03/08/2022   CL 97 03/08/2022   CO2 22 03/08/2022   Lab Results  Component Value Date  ALT 18 03/08/2022   AST 14 03/08/2022   ALKPHOS 105 03/08/2022   BILITOT 0.4 03/08/2022   Lab Results  Component Value Date   HGBA1C 5.5 03/08/2022   HGBA1C 5.2 12/25/2020   HGBA1C 5.2 12/13/2019   HGBA1C 5.1 11/14/2018   HGBA1C 5.2 06/28/2018   Lab Results  Component Value Date   INSULIN 28.3 (H) 03/08/2022   INSULIN 14.9 12/25/2020   INSULIN 16.5 12/13/2019   INSULIN 20.4  11/14/2018   INSULIN 14.2 06/28/2018   Lab Results  Component Value Date   TSH 4.550 (H) 03/08/2022   Lab Results  Component Value Date   CHOL 155 03/08/2022   HDL 55 03/08/2022   LDLCALC 70 03/08/2022   TRIG 182 (H) 03/08/2022   CHOLHDL 3.1 10/18/2017   Lab Results  Component Value Date   VD25OH 100.0 03/08/2022   VD25OH 49.2 12/25/2020   VD25OH 50.3 12/13/2019   Lab Results  Component Value Date   WBC 11.6 (H) 06/28/2018   HGB 14.8 06/28/2018   HCT 43.8 06/28/2018   MCV 90 06/28/2018   PLT 375.0 02/03/2018   No results found for: "IRON", "TIBC", "FERRITIN"   Attestation Statements:   Reviewed by clinician on day of visit: allergies, medications, problem list, medical history, surgical history, family history, social history, and previous encounter notes.  I, Davy Pique, am acting as Location manager for Loyal Gambler, DO.  I have reviewed the above documentation for accuracy and completeness, and I agree with the above. Dell Ponto, DO

## 2022-07-31 ENCOUNTER — Other Ambulatory Visit (INDEPENDENT_AMBULATORY_CARE_PROVIDER_SITE_OTHER): Payer: Self-pay | Admitting: Bariatrics

## 2022-07-31 DIAGNOSIS — I10 Essential (primary) hypertension: Secondary | ICD-10-CM

## 2022-08-12 ENCOUNTER — Encounter (INDEPENDENT_AMBULATORY_CARE_PROVIDER_SITE_OTHER): Payer: Self-pay | Admitting: Family Medicine

## 2022-08-12 ENCOUNTER — Ambulatory Visit (INDEPENDENT_AMBULATORY_CARE_PROVIDER_SITE_OTHER): Payer: 59 | Admitting: Family Medicine

## 2022-08-12 VITALS — BP 137/87 | HR 83 | Temp 97.8°F | Ht 65.0 in | Wt 236.0 lb

## 2022-08-12 DIAGNOSIS — Z6839 Body mass index (BMI) 39.0-39.9, adult: Secondary | ICD-10-CM

## 2022-08-12 DIAGNOSIS — E669 Obesity, unspecified: Secondary | ICD-10-CM

## 2022-08-12 DIAGNOSIS — I10 Essential (primary) hypertension: Secondary | ICD-10-CM | POA: Diagnosis not present

## 2022-08-12 DIAGNOSIS — E559 Vitamin D deficiency, unspecified: Secondary | ICD-10-CM

## 2022-08-12 MED ORDER — CHLORTHALIDONE 25 MG PO TABS: 25.0000 mg | ORAL_TABLET | Freq: Every day | ORAL | 0 refills | Status: DC

## 2022-08-23 NOTE — Progress Notes (Signed)
Chief Complaint:   OBESITY Erin Warren is here to discuss her progress with her obesity treatment plan along with follow-up of her obesity related diagnoses. Erin Warren is on keeping a food journal and adhering to recommended goals of 1200 calories and 80-90 grams of protein and states she is following her eating plan approximately 90% of the time. Erin Warren states she is exercising for 10 minutes 2 times per week.  Today's visit was #: 54 Starting weight: 240 lbs Starting date: 06/28/2018 Today's weight: 236 lbs Today's date: 08/12/2022 Total lbs lost to date: 4 Total lbs lost since last in-office visit: 2  Interim History: Erin Warren has done well with avoiding holiday weight gain. She is doing a every other day 24 hour fast per Dr. Owens Shark. She is making strategies to increase her protein and portion control over the holidays.   Subjective:   1. Essential hypertension Erin Warren's blood pressure is stable on her medications, and she has no signs of hypotension.   2. Vitamin D deficiency Erin Warren is stable on Vitamin D OTC, and she denies nausea, vomiting, or muscle weakness.   Assessment/Plan:   1. Essential hypertension We will continue chlorthalidone 25 mg once daily, and we will refill for 90 days.  - chlorthalidone (HYGROTON) 25 MG tablet; Take 1 tablet (25 mg total) by mouth daily.  Dispense: 90 tablet; Refill: 0  2. Vitamin D deficiency Erin Warren will continue OTC Vitamin D as is, and we will recheck labs in 1-2 months.   3. Obesity, Current BMI 39.4 Erin Warren is currently in the action stage of change. As such, her goal is to continue with weight loss efforts. She has agreed to practicing portion control and making smarter food choices, such as increasing vegetables and decreasing simple carbohydrates.   Exercise goals: As is.   Behavioral modification strategies: increasing lean protein intake and meal planning and cooking strategies.  Erin Warren has agreed to follow-up with our clinic in 4 weeks. She  was informed of the importance of frequent follow-up visits to maximize her success with intensive lifestyle modifications for her multiple health conditions.   Objective:   Blood pressure 137/87, pulse 83, temperature 97.8 F (36.6 C), height '5\' 5"'$  (1.651 m), weight 236 lb (107 kg). Body mass index is 39.27 kg/m.  General: Cooperative, alert, well developed, in no acute distress. HEENT: Conjunctivae and lids unremarkable. Cardiovascular: Regular rhythm.  Lungs: Normal work of breathing. Neurologic: No focal deficits.   Lab Results  Component Value Date   CREATININE 0.83 03/08/2022   BUN 16 03/08/2022   NA 138 03/08/2022   K 3.8 03/08/2022   CL 97 03/08/2022   CO2 22 03/08/2022   Lab Results  Component Value Date   ALT 18 03/08/2022   AST 14 03/08/2022   ALKPHOS 105 03/08/2022   BILITOT 0.4 03/08/2022   Lab Results  Component Value Date   HGBA1C 5.5 03/08/2022   HGBA1C 5.2 12/25/2020   HGBA1C 5.2 12/13/2019   HGBA1C 5.1 11/14/2018   HGBA1C 5.2 06/28/2018   Lab Results  Component Value Date   INSULIN 28.3 (H) 03/08/2022   INSULIN 14.9 12/25/2020   INSULIN 16.5 12/13/2019   INSULIN 20.4 11/14/2018   INSULIN 14.2 06/28/2018   Lab Results  Component Value Date   TSH 4.550 (H) 03/08/2022   Lab Results  Component Value Date   CHOL 155 03/08/2022   HDL 55 03/08/2022   LDLCALC 70 03/08/2022   TRIG 182 (H) 03/08/2022   CHOLHDL 3.1 10/18/2017  Lab Results  Component Value Date   VD25OH 100.0 03/08/2022   VD25OH 49.2 12/25/2020   VD25OH 50.3 12/13/2019   Lab Results  Component Value Date   WBC 11.6 (H) 06/28/2018   HGB 14.8 06/28/2018   HCT 43.8 06/28/2018   MCV 90 06/28/2018   PLT 375.0 02/03/2018   No results found for: "IRON", "TIBC", "FERRITIN"  Attestation Statements:   Reviewed by clinician on day of visit: allergies, medications, problem list, medical history, surgical history, family history, social history, and previous encounter notes.  I  have personally spent 40 minutes total time today in preparation, patient care, and documentation for this visit, including the following: review of clinical lab tests; review of medical tests/procedures/services.   I, [name] Dredge, am acting as transcriptionist for Dennard Nip, MD.  I have reviewed the above documentation for accuracy and completeness, and I agree with the above. -  Dennard Nip, MD

## 2022-09-09 ENCOUNTER — Telehealth: Payer: Self-pay | Admitting: Diagnostic Neuroimaging

## 2022-09-09 NOTE — Telephone Encounter (Signed)
LVM and sent mychart msg informing pt of appointment change due to provider template change

## 2022-09-16 ENCOUNTER — Encounter (INDEPENDENT_AMBULATORY_CARE_PROVIDER_SITE_OTHER): Payer: Self-pay | Admitting: Family Medicine

## 2022-09-16 ENCOUNTER — Ambulatory Visit (INDEPENDENT_AMBULATORY_CARE_PROVIDER_SITE_OTHER): Payer: 59 | Admitting: Family Medicine

## 2022-09-16 VITALS — BP 150/82 | HR 90 | Temp 97.7°F | Ht 65.0 in | Wt 228.0 lb

## 2022-09-16 DIAGNOSIS — I10 Essential (primary) hypertension: Secondary | ICD-10-CM

## 2022-09-16 DIAGNOSIS — E669 Obesity, unspecified: Secondary | ICD-10-CM

## 2022-09-16 DIAGNOSIS — R7303 Prediabetes: Secondary | ICD-10-CM | POA: Diagnosis not present

## 2022-09-16 DIAGNOSIS — Z6837 Body mass index (BMI) 37.0-37.9, adult: Secondary | ICD-10-CM | POA: Diagnosis not present

## 2022-09-27 NOTE — Progress Notes (Signed)
Chief Complaint:   OBESITY Erin Warren is here to discuss her progress with her obesity treatment plan along with follow-up of her obesity related diagnoses. Erin Warren is on practicing portion control and making smarter food choices, such as increasing vegetables and decreasing simple carbohydrates and states she is following her eating plan approximately 90% of the time. Erin Warren states she is doing yoga for 70 minutes 5 times per week.  Today's visit was #: 62 Starting weight: 240 lbs Starting date: 06/28/2018 Today's weight: 228 lbs Today's date: 09/16/2022 Total lbs lost to date: 12 Total lbs lost since last in-office visit: 8  Interim History: Erin Warren is doing her own fast with only water and electrolytes on Monday, Wednesday, and Friday.  She is trying to eat healthier.  She is not meeting her protein goals and she is at risk of sarcopenia and decreased RMR.  Subjective:   1. Essential hypertension Erin Warren's blood pressure is elevated today, as it has been controlled on and off. We discussed the reasons this could be extensively today  2. Prediabetes Erin Warren is doing well with her weight loss, and she is tolerating metformin with no side effects noted.  Assessment/Plan:   1. Essential hypertension Erin Warren will continue chlorthalidone and will watch for increased sodium.  We will recheck her blood pressure in 4 weeks.  2. Prediabetes Erin Warren will continue to decrease simple carbohydrates to prevent diabetes mellitus, and we will continue to follow.  3. Obesity, Current BMI 37.9 Erin Warren is currently in the action stage of change. As such, her goal is to continue with weight loss efforts. She has agreed to practicing portion control and making smarter food choices, such as increasing vegetables and decreasing simple carbohydrates.   Erin Warren is to try to increase protein on non-fasting days.   Exercise goals: As is.   Behavioral modification strategies: increasing lean protein intake.  Erin Warren  has agreed to follow-up with our clinic in 4 weeks. She was informed of the importance of frequent follow-up visits to maximize her success with intensive lifestyle modifications for her multiple health conditions.   Objective:   Blood pressure (!) 150/82, pulse 90, temperature 97.7 F (36.5 C), height 5\' 5"  (1.651 m), weight 228 lb (103.4 kg), SpO2 97 %. Body mass index is 37.94 kg/m.  General: Cooperative, alert, well developed, in no acute distress. HEENT: Conjunctivae and lids unremarkable. Cardiovascular: Regular rhythm.  Lungs: Normal work of breathing. Neurologic: No focal deficits.   Lab Results  Component Value Date   CREATININE 0.83 03/08/2022   BUN 16 03/08/2022   NA 138 03/08/2022   K 3.8 03/08/2022   CL 97 03/08/2022   CO2 22 03/08/2022   Lab Results  Component Value Date   ALT 18 03/08/2022   AST 14 03/08/2022   ALKPHOS 105 03/08/2022   BILITOT 0.4 03/08/2022   Lab Results  Component Value Date   HGBA1C 5.5 03/08/2022   HGBA1C 5.2 12/25/2020   HGBA1C 5.2 12/13/2019   HGBA1C 5.1 11/14/2018   HGBA1C 5.2 06/28/2018   Lab Results  Component Value Date   INSULIN 28.3 (H) 03/08/2022   INSULIN 14.9 12/25/2020   INSULIN 16.5 12/13/2019   INSULIN 20.4 11/14/2018   INSULIN 14.2 06/28/2018   Lab Results  Component Value Date   TSH 4.550 (H) 03/08/2022   Lab Results  Component Value Date   CHOL 155 03/08/2022   HDL 55 03/08/2022   LDLCALC 70 03/08/2022   TRIG 182 (H) 03/08/2022   CHOLHDL  3.1 10/18/2017   Lab Results  Component Value Date   VD25OH 100.0 03/08/2022   VD25OH 49.2 12/25/2020   VD25OH 50.3 12/13/2019   Lab Results  Component Value Date   WBC 11.6 (H) 06/28/2018   HGB 14.8 06/28/2018   HCT 43.8 06/28/2018   MCV 90 06/28/2018   PLT 375.0 02/03/2018   No results found for: "IRON", "TIBC", "FERRITIN"  Attestation Statements:   Reviewed by clinician on day of visit: allergies, medications, problem list, medical history, surgical  history, family history, social history, and previous encounter notes.  I have personally spent 42 minutes total time today in preparation, patient care, and documentation for this visit, including the following: review of clinical lab tests; review of medical tests/procedures/services.   I, Burt Knack, am acting as transcriptionist for Quillian Quince, MD.  I have reviewed the above documentation for accuracy and completeness, and I agree with the above. -  Quillian Quince, MD

## 2022-10-04 ENCOUNTER — Other Ambulatory Visit (INDEPENDENT_AMBULATORY_CARE_PROVIDER_SITE_OTHER): Payer: Self-pay | Admitting: Family Medicine

## 2022-10-04 DIAGNOSIS — I10 Essential (primary) hypertension: Secondary | ICD-10-CM

## 2022-10-11 ENCOUNTER — Telehealth: Payer: 59 | Admitting: Diagnostic Neuroimaging

## 2022-10-12 ENCOUNTER — Telehealth: Payer: 59 | Admitting: Diagnostic Neuroimaging

## 2022-10-12 ENCOUNTER — Telehealth: Payer: Self-pay | Admitting: Diagnostic Neuroimaging

## 2022-10-12 ENCOUNTER — Encounter: Payer: Self-pay | Admitting: Diagnostic Neuroimaging

## 2022-10-12 DIAGNOSIS — R9089 Other abnormal findings on diagnostic imaging of central nervous system: Secondary | ICD-10-CM | POA: Diagnosis not present

## 2022-10-12 DIAGNOSIS — I10 Essential (primary) hypertension: Secondary | ICD-10-CM

## 2022-10-12 MED ORDER — PROPRANOLOL HCL ER 120 MG PO CP24: ORAL_CAPSULE | ORAL | 4 refills | Status: DC
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
## 2022-10-12 NOTE — Telephone Encounter (Signed)
[employer] sent to [facility] they obtain auth [id number] 

## 2022-10-14 ENCOUNTER — Ambulatory Visit (INDEPENDENT_AMBULATORY_CARE_PROVIDER_SITE_OTHER): Payer: 59 | Admitting: Family Medicine

## 2022-10-14 ENCOUNTER — Encounter (INDEPENDENT_AMBULATORY_CARE_PROVIDER_SITE_OTHER): Payer: Self-pay | Admitting: Family Medicine

## 2022-10-14 VITALS — BP 166/115 | HR 78 | Temp 98.1°F | Ht 65.0 in | Wt 225.0 lb

## 2022-10-14 DIAGNOSIS — R7303 Prediabetes: Secondary | ICD-10-CM | POA: Diagnosis not present

## 2022-10-14 DIAGNOSIS — I10 Essential (primary) hypertension: Secondary | ICD-10-CM

## 2022-10-14 DIAGNOSIS — Z6839 Body mass index (BMI) 39.0-39.9, adult: Secondary | ICD-10-CM | POA: Insufficient documentation

## 2022-10-14 DIAGNOSIS — E669 Obesity, unspecified: Secondary | ICD-10-CM | POA: Diagnosis not present

## 2022-10-14 DIAGNOSIS — Z6837 Body mass index (BMI) 37.0-37.9, adult: Secondary | ICD-10-CM

## 2022-10-14 MED ORDER — CHLORTHALIDONE 25 MG PO TABS: 25.0000 mg | ORAL_TABLET | Freq: Every day | ORAL | 0 refills | Status: DC

## 2022-10-27 NOTE — Progress Notes (Unsigned)
Chief Complaint:   OBESITY Erin Warren is here to discuss her progress with her obesity treatment plan along with follow-up of her obesity related diagnoses. Erin Warren is on practicing portion control and making smarter food choices, such as increasing vegetables and decreasing simple carbohydrates and states she is following her eating plan approximately 80% of the time. Erin Warren states she is doing yoga for 10-15 minutes 5 times per week.  Today's visit was #: 46 Starting weight: 240 lbs Starting date: 06/28/2018 Today's weight: 225 lbs Today's date: 10/14/2022 Total lbs lost to date: 15 Total lbs lost since last in-office visit: 3  Interim History: Erin Warren has done well with weight loss. She is doing intermittent fasting 8:16. She si working on portion control and [employer], and she is trying to increase protein and vegetables.   Subjective:   1. Essential hypertension Erin Warren's blood pressure is elevated. She is on Sudafed for sinus congestion today. She denies chest pain or headache.   2. Prediabetes Erin Warren continues to work on her diet, exercise, and weight loss. She is stable on metformin with no side effects noted.   Assessment/Plan:   1. Essential hypertension We will refill chlorthalidone for 90 days, and we will recheck her blood pressure in 4 weeks.   - chlorthalidone (HYGROTON) 25 MG tablet; Take 1 tablet (25 mg total) by mouth daily.  Dispense: 90 tablet; Refill: 0  2. Prediabetes Erin Warren will continue metformin and her diet, and we will recheck labs in 1-2 months.   3. BMI 37.0-37.9, adult  4. Obesity, Beginning BMI 39.94 Erin Warren is currently in the action stage of change. As such, her goal is to continue with weight loss efforts. She has agreed to the Category 2 Plan.   Exercise goals: As is.   Behavioral modification strategies: increasing vegetables.  Erin Warren has agreed to follow-up with our clinic in 4 weeks. She was informed of the importance of frequent  follow-up visits to maximize her success with intensive lifestyle modifications for her multiple health conditions.   Objective:   Blood pressure (!) 166/115, pulse 78, temperature 98.1 F (36.7 C), height 5' 5"$  (1.651 m), weight 225 lb (102.1 kg), SpO2 96 %. Body mass index is 37.44 kg/m.  General: Cooperative, alert, well developed, in no acute distress. HEENT: Conjunctivae and lids unremarkable. Cardiovascular: Regular rhythm.  Lungs: Normal work of breathing. Neurologic: No focal deficits.   Lab Results  Component Value Date   CREATININE 0.83 03/08/2022   BUN 16 03/08/2022   NA 138 03/08/2022   K 3.8 03/08/2022   CL 97 03/08/2022   CO2 22 03/08/2022   Lab Results  Component Value Date   ALT 18 03/08/2022   AST 14 03/08/2022   ALKPHOS 105 03/08/2022   BILITOT 0.4 03/08/2022   Lab Results  Component Value Date   HGBA1C 5.5 03/08/2022   HGBA1C 5.2 12/25/2020   HGBA1C 5.2 12/13/2019   HGBA1C 5.1 11/14/2018   HGBA1C 5.2 06/28/2018   Lab Results  Component Value Date   INSULIN 28.3 (H) 03/08/2022   INSULIN 14.9 12/25/2020   INSULIN 16.5 12/13/2019   INSULIN 20.4 11/14/2018   INSULIN 14.2 06/28/2018   Lab Results  Component Value Date   TSH 4.550 (H) 03/08/2022   Lab Results  Component Value Date   CHOL 155 03/08/2022   HDL 55 03/08/2022   LDLCALC 70 03/08/2022   TRIG 182 (H) 03/08/2022   CHOLHDL 3.1 10/18/2017   Lab Results  Component Value  Date   VD25OH 100.0 03/08/2022   VD25OH 49.2 12/25/2020   VD25OH 50.3 12/13/2019   Lab Results  Component Value Date   WBC 11.6 (H) 06/28/2018   HGB 14.8 06/28/2018   HCT 43.8 06/28/2018   MCV 90 06/28/2018   PLT 375.0 02/03/2018   No results found for: "IRON", "TIBC", "FERRITIN"  Attestation Statements:   Reviewed by clinician on day of visit: allergies, medications, problem list, medical history, surgical history, family history, social history, and previous encounter notes.   I, [name] Dredge, am  acting as transcriptionist for Dennard Nip, MD.  I have reviewed the above documentation for accuracy and completeness, and I agree with the above. -  Dennard Nip, MD

## 2022-11-11 ENCOUNTER — Ambulatory Visit (INDEPENDENT_AMBULATORY_CARE_PROVIDER_SITE_OTHER): Payer: 59 | Admitting: Family Medicine

## 2022-11-11 ENCOUNTER — Encounter (INDEPENDENT_AMBULATORY_CARE_PROVIDER_SITE_OTHER): Payer: Self-pay | Admitting: Family Medicine

## 2022-11-11 VITALS — BP 141/90 | HR 83 | Temp 97.9°F | Ht 65.0 in | Wt 226.0 lb

## 2022-11-11 DIAGNOSIS — E559 Vitamin D deficiency, unspecified: Secondary | ICD-10-CM | POA: Diagnosis not present

## 2022-11-11 DIAGNOSIS — Z6837 Body mass index (BMI) 37.0-37.9, adult: Secondary | ICD-10-CM

## 2022-11-11 DIAGNOSIS — E7849 Other hyperlipidemia: Secondary | ICD-10-CM | POA: Diagnosis not present

## 2022-11-11 DIAGNOSIS — E038 Other specified hypothyroidism: Secondary | ICD-10-CM | POA: Diagnosis not present

## 2022-11-11 DIAGNOSIS — E669 Obesity, unspecified: Secondary | ICD-10-CM

## 2022-11-11 DIAGNOSIS — R7303 Prediabetes: Secondary | ICD-10-CM | POA: Diagnosis not present

## 2022-11-12 LAB — CMP14+EGFR
ALT: 28 IU/L (ref 0–32)
AST: 23 IU/L (ref 0–40)
Albumin/Globulin Ratio: 1.4 (ref 1.2–2.2)
Albumin: 4.2 g/dL (ref 3.9–4.9)
Alkaline Phosphatase: 111 IU/L (ref 44–121)
BUN/Creatinine Ratio: 12 (ref 9–23)
BUN: 10 mg/dL (ref 6–24)
Bilirubin Total: <0.2 mg/dL (ref 0.0–1.2)
CO2: 21 mmol/L (ref 20–29)
Calcium: 10 mg/dL (ref 8.7–10.2)
Chloride: 97 mmol/L (ref 96–106)
Creatinine, Ser: 0.82 mg/dL (ref 0.57–1.00)
Globulin, Total: 2.9 g/dL (ref 1.5–4.5)
Glucose: 75 mg/dL (ref 70–99)
Potassium: 3.7 mmol/L (ref 3.5–5.2)
Sodium: 135 mmol/L (ref 134–144)
Total Protein: 7.1 g/dL (ref 6.0–8.5)
eGFR: 88 mL/min/{1.73_m2} (ref >59)

## 2022-11-12 LAB — TSH: TSH: 3.33 u[IU]/mL (ref 0.450–4.500)

## 2022-11-12 LAB — LIPID PANEL WITH LDL/HDL RATIO
Cholesterol, Total: 143 mg/dL (ref 100–199)
HDL: 53 mg/dL (ref >39)
LDL Chol Calc (NIH): 69 mg/dL (ref 0–99)
LDL/HDL Ratio: 1.3 ratio (ref 0.0–3.2)
Triglycerides: 115 mg/dL (ref 0–149)
VLDL Cholesterol Cal: 21 mg/dL (ref 5–40)

## 2022-11-12 LAB — VITAMIN B12: Vitamin B-12: 940 pg/mL (ref 232–1245)

## 2022-11-12 LAB — INSULIN, RANDOM: INSULIN: 15.6 u[IU]/mL (ref 2.6–24.9)

## 2022-11-12 LAB — VITAMIN D 25 HYDROXY (VIT D DEFICIENCY, FRACTURES): Vit D, 25-Hydroxy: 85 ng/mL (ref 30.0–100.0)

## 2022-11-12 LAB — HEMOGLOBIN A1C
Est. average glucose Bld gHb Est-mCnc: 111 mg/dL
Hgb A1c MFr Bld: 5.5 % (ref 4.8–5.6)

## 2022-11-23 NOTE — Progress Notes (Unsigned)
Chief Complaint:   OBESITY Majesti is here to discuss her progress with her obesity treatment plan along with follow-up of her obesity related diagnoses. Helayna is on {MWMwtlossportion/plan2:23431} and states she is following her eating plan approximately ***% of the time. Benjie states she is *** *** minutes *** times per week.  Today's visit was #: *** Starting weight: *** Starting date: *** Today's weight: *** Today's date: 11/11/2022 Total lbs lost to date: *** Total lbs lost since last in-office visit: ***  Interim History: ***  Subjective:   1. Prediabetes ***  2. Other hyperlipidemia ***  3. Vitamin D deficiency ***  4. Other specified hypothyroidism ***  Assessment/Plan:   1. Prediabetes *** - Vitamin B12 - CMP14+EGFR - Hemoglobin A1c - Insulin, random - Lipid Panel With LDL/HDL Ratio  2. Other hyperlipidemia ***  3. Vitamin D deficiency *** - VITAMIN D 25 Hydroxy (Vit-D Deficiency, Fractures)  4. Other specified hypothyroidism *** - TSH  5. BMI 37.0-37.9, adult  6. Obesity, Beginning BMI 39.94 Jaquay {CHL AMB IS/IS NOT:210130109} currently in the action stage of change. As such, her goal is to {MWMwtloss#1:210800005}. She has agreed to {MWMwtlossportion/plan2:23431}.   Exercise goals: {MWM EXERCISE RECS:23473}  Behavioral modification strategies: {MWMwtlossdietstrategies3:23432}.  Armony has agreed to follow-up with our clinic in {NUMBER 1-10:22536} weeks. She was informed of the importance of frequent follow-up visits to maximize her success with intensive lifestyle modifications for her multiple health conditions.   Arbor was informed we would discuss her lab results at her next visit unless there is a critical issue that needs to be addressed sooner. Guenevere agreed to keep her next visit at the agreed upon time to discuss these results.  Objective:   Blood pressure (!) 141/90, pulse 83, temperature 97.9 F (36.6 C), height 5\' 5"  (1.651 m),  weight 226 lb (102.5 kg), SpO2 96 %. Body mass index is 37.61 kg/m.  Lab Results  Component Value Date   CREATININE 0.82 11/11/2022   BUN 10 11/11/2022   NA 135 11/11/2022   K 3.7 11/11/2022   CL 97 11/11/2022   CO2 21 11/11/2022   Lab Results  Component Value Date   ALT 28 11/11/2022   AST 23 11/11/2022   ALKPHOS 111 11/11/2022   BILITOT <0.2 11/11/2022   Lab Results  Component Value Date   HGBA1C 5.5 11/11/2022   HGBA1C 5.5 03/08/2022   HGBA1C 5.2 12/25/2020   HGBA1C 5.2 12/13/2019   HGBA1C 5.1 11/14/2018   Lab Results  Component Value Date   INSULIN 15.6 11/11/2022   INSULIN 28.3 (H) 03/08/2022   INSULIN 14.9 12/25/2020   INSULIN 16.5 12/13/2019   INSULIN 20.4 11/14/2018   Lab Results  Component Value Date   TSH 3.330 11/11/2022   Lab Results  Component Value Date   CHOL 143 11/11/2022   HDL 53 11/11/2022   LDLCALC 69 11/11/2022   TRIG 115 11/11/2022   CHOLHDL 3.1 10/18/2017   Lab Results  Component Value Date   VD25OH 85.0 11/11/2022   VD25OH 100.0 03/08/2022   VD25OH 49.2 12/25/2020   Lab Results  Component Value Date   WBC 11.6 (H) 06/28/2018   HGB 14.8 06/28/2018   HCT 43.8 06/28/2018   MCV 90 06/28/2018   PLT 375.0 02/03/2018   No results found for: "IRON", "TIBC", "FERRITIN"  Attestation Statements:   Reviewed by clinician on day of visit: allergies, medications, problem list, medical history, surgical history, family history, social history, and previous encounter notes.  I, [name] Dredge, am acting as transcriptionist for Dennard Nip, MD.  I have reviewed the above documentation for accuracy and completeness, and I agree with the above. -  ***

## 2022-12-09 ENCOUNTER — Ambulatory Visit (INDEPENDENT_AMBULATORY_CARE_PROVIDER_SITE_OTHER): Payer: 59 | Admitting: Family Medicine

## 2022-12-13 ENCOUNTER — Encounter: Payer: Self-pay | Admitting: Diagnostic Neuroimaging

## 2022-12-14 ENCOUNTER — Ambulatory Visit (INDEPENDENT_AMBULATORY_CARE_PROVIDER_SITE_OTHER): Payer: 59 | Admitting: Family Medicine

## 2022-12-14 ENCOUNTER — Encounter (INDEPENDENT_AMBULATORY_CARE_PROVIDER_SITE_OTHER): Payer: Self-pay | Admitting: Family Medicine

## 2022-12-14 VITALS — BP 145/81 | HR 81 | Temp 98.0°F | Ht 65.0 in | Wt 225.0 lb

## 2022-12-14 DIAGNOSIS — E669 Obesity, unspecified: Secondary | ICD-10-CM

## 2022-12-14 DIAGNOSIS — Z6837 Body mass index (BMI) 37.0-37.9, adult: Secondary | ICD-10-CM | POA: Diagnosis not present

## 2022-12-14 DIAGNOSIS — I10 Essential (primary) hypertension: Secondary | ICD-10-CM

## 2022-12-14 DIAGNOSIS — E559 Vitamin D deficiency, unspecified: Secondary | ICD-10-CM | POA: Diagnosis not present

## 2022-12-15 NOTE — Progress Notes (Signed)
Chief Complaint:   OBESITY Erin Warren is here to discuss her progress with her obesity treatment plan along with follow-up of her obesity related diagnoses. Erin Warren is on the Category 2 Plan and states she is following her eating plan approximately 70% of the time. Erin Warren states she is doing yoga for 30 minutes 5 times per week.  Today's visit was #: 65 Starting weight: 240 lbs Starting date: 06/28/2018 Today's weight: 225 lbs Today's date: 12/14/2022 Total lbs lost to date: 15 Total lbs lost since last in-office visit: 1  Interim History: Erin Warren has done well with weight loss.  She is working on portion control and trying to decrease simple carbohydrates.  She is exercising most days and she feels good about this.  Subjective:   1. Essential hypertension Nailyn's blood pressure is elevated today, even with second blood pressure taken.  She is on chlorthalidone and propranolol.  2. Vitamin D deficiency Erin Warren is on vitamin D OTC 2000 units daily.  She had been over replaced on a higher vitamin D dose, but her level is no longer too high.  Assessment/Plan:   1. Essential hypertension Erin Warren is to continue to work on her diet and weight loss.  If her blood pressure remains elevated she may need her medications adjusted to prevent further health issues.  2. Vitamin D deficiency Erin Warren will continue OTC vitamin D 2000 units daily, and we will recheck labs in 3 months.  3. BMI 37.0-37.9, adult  4. Obesity, Beginning BMI 39.94 Erin Warren is currently in the action stage of change. As such, her goal is to continue with weight loss efforts. She has agreed to the Category 2 Plan and keeping a food journal and adhering to recommended goals of 400-600 calories and 40+ grams of protein at supper daily.   Exercise goals: As is.   Behavioral modification strategies: increasing lean protein intake.  Erin Warren has agreed to follow-up with our clinic in 4 weeks. She was informed of the importance of frequent  follow-up visits to maximize her success with intensive lifestyle modifications for her multiple health conditions.   Objective:   Blood pressure (!) 145/81, pulse 81, temperature 98 F (36.7 C), height 5\' 5"  (1.651 m), weight 225 lb (102.1 kg), SpO2 95 %. Body mass index is 37.44 kg/m.  Lab Results  Component Value Date   CREATININE 0.82 11/11/2022   BUN 10 11/11/2022   NA 135 11/11/2022   K 3.7 11/11/2022   CL 97 11/11/2022   CO2 21 11/11/2022   Lab Results  Component Value Date   ALT 28 11/11/2022   AST 23 11/11/2022   ALKPHOS 111 11/11/2022   BILITOT <0.2 11/11/2022   Lab Results  Component Value Date   HGBA1C 5.5 11/11/2022   HGBA1C 5.5 03/08/2022   HGBA1C 5.2 12/25/2020   HGBA1C 5.2 12/13/2019   HGBA1C 5.1 11/14/2018   Lab Results  Component Value Date   INSULIN 15.6 11/11/2022   INSULIN 28.3 (H) 03/08/2022   INSULIN 14.9 12/25/2020   INSULIN 16.5 12/13/2019   INSULIN 20.4 11/14/2018   Lab Results  Component Value Date   TSH 3.330 11/11/2022   Lab Results  Component Value Date   CHOL 143 11/11/2022   HDL 53 11/11/2022   LDLCALC 69 11/11/2022   TRIG 115 11/11/2022   CHOLHDL 3.1 10/18/2017   Lab Results  Component Value Date   VD25OH 85.0 11/11/2022   VD25OH 100.0 03/08/2022   VD25OH 49.2 12/25/2020   Lab Results  Component Value Date   WBC 11.6 (H) 06/28/2018   HGB 14.8 06/28/2018   HCT 43.8 06/28/2018   MCV 90 06/28/2018   PLT 375.0 02/03/2018   No results found for: "IRON", "TIBC", "FERRITIN"  Attestation Statements:   Reviewed by clinician on day of visit: allergies, medications, problem list, medical history, surgical history, family history, social history, and previous encounter notes.  I have personally spent 42 minutes total time today in preparation, patient care, and documentation for this visit, including the following: review of clinical lab tests; review of medical tests/procedures/services.   I, Burt Knack, am acting as  transcriptionist for Quillian Quince, MD.  I have reviewed the above documentation for accuracy and completeness, and I agree with the above. -  Quillian Quince, MD

## 2022-12-17 ENCOUNTER — Ambulatory Visit
Admission: RE | Admit: 2022-12-17 | Discharge: 2022-12-17 | Disposition: A | Discharge status: Home / Self Care | Payer: 59 | Source: Ambulatory Visit | Attending: Diagnostic Neuroimaging | Admitting: Diagnostic Neuroimaging

## 2022-12-17 DIAGNOSIS — R9089 Other abnormal findings on diagnostic imaging of central nervous system: Secondary | ICD-10-CM

## 2023-01-20 ENCOUNTER — Encounter (INDEPENDENT_AMBULATORY_CARE_PROVIDER_SITE_OTHER): Payer: Self-pay | Admitting: Family Medicine

## 2023-01-20 ENCOUNTER — Ambulatory Visit (INDEPENDENT_AMBULATORY_CARE_PROVIDER_SITE_OTHER): Payer: 59 | Admitting: Family Medicine

## 2023-01-20 VITALS — BP 147/90 | HR 90 | Temp 97.9°F | Ht 65.0 in | Wt 224.0 lb

## 2023-01-20 DIAGNOSIS — I10 Essential (primary) hypertension: Secondary | ICD-10-CM | POA: Diagnosis not present

## 2023-01-20 DIAGNOSIS — E88819 Insulin resistance, unspecified: Secondary | ICD-10-CM

## 2023-01-20 DIAGNOSIS — E669 Obesity, unspecified: Secondary | ICD-10-CM | POA: Diagnosis not present

## 2023-01-20 DIAGNOSIS — Z6837 Body mass index (BMI) 37.0-37.9, adult: Secondary | ICD-10-CM | POA: Diagnosis not present

## 2023-01-20 MED ORDER — LOSARTAN POTASSIUM 50 MG PO TABS: 50.0000 mg | ORAL_TABLET | Freq: Every day | ORAL | 0 refills | Status: DC

## 2023-01-20 MED ORDER — CHLORTHALIDONE 25 MG PO TABS: 25.0000 mg | ORAL_TABLET | Freq: Every day | ORAL | 0 refills | Status: DC

## 2023-01-24 NOTE — Progress Notes (Unsigned)
Chief Complaint:   OBESITY Erin Warren is here to discuss her progress with her obesity treatment plan along with follow-up of her obesity related diagnoses. Lexius is on the Category 2 Plan and keeping a food journal and adhering to recommended goals of 400-600 calories and 40+ grams of protein at supper daily and states she is following her eating plan approximately 75% of the time. Sharifa states she is doing yoga and PT for 25-30 minutes 2-5 times per week.  Today's visit was #: 66 Starting weight: 240 lbs Starting date: 06/28/2018 Today's weight: 224 lbs Today's date: 01/20/2023 Total lbs lost to date: 16 Total lbs lost since last in-office visit: 1  Interim History: Erin Warren continues to work on her diet and exercise.  She is trying to meet her protein goals.  She is increasing her exercise and doing physical therapy.  Her hunger is mostly controlled.  Subjective:   1. Essential hypertension Erin Warren is on chlorthalidone.  Her blood pressure is still above goal however even with her second blood pressure reading.  2. Insulin resistance Erin Warren has a history of polycystic ovarian syndrome and she is on metformin.  She denies nausea or vomiting, and she is working on decreasing simple carbohydrates.  Assessment/Plan:   1. Essential hypertension Arnell agreed to start losartan 50 mg once daily with no refills; and we will refill chlorthalidone for 90 days.  We will follow-up at her next visit in 1 month.  - chlorthalidone (HYGROTON) 25 MG tablet; Take 1 tablet (25 mg total) by mouth daily.  Dispense: 90 tablet; Refill: 0 - losartan (COZAAR) 50 MG tablet; Take 1 tablet (50 mg total) by mouth daily.  Dispense: 30 tablet; Refill: 0  2. Insulin resistance Erin Warren will continue metformin and her diet, and we will continue to monitor.  3. BMI 37.0-37.9, adult  4. Obesity, Beginning BMI 39.94 Erin Warren is currently in the action stage of change. As such, her goal is to continue with weight loss  efforts. She has agreed to the Category 2 Plan.   Exercise goals: As is.   Behavioral modification strategies: increasing lean protein intake and meal planning and cooking strategies.  Erin Warren has agreed to follow-up with our clinic in 4 weeks. She was informed of the importance of frequent follow-up visits to maximize her success with intensive lifestyle modifications for her multiple health conditions.   Objective:   Blood pressure (!) 147/90, pulse 90, temperature 97.9 F (36.6 C), height 5\' 5"  (1.651 m), weight 224 lb (101.6 kg), SpO2 97 %. Body mass index is 37.28 kg/m.  Lab Results  Component Value Date   CREATININE 0.82 11/11/2022   BUN 10 11/11/2022   NA 135 11/11/2022   K 3.7 11/11/2022   CL 97 11/11/2022   CO2 21 11/11/2022   Lab Results  Component Value Date   ALT 28 11/11/2022   AST 23 11/11/2022   ALKPHOS 111 11/11/2022   BILITOT <0.2 11/11/2022   Lab Results  Component Value Date   HGBA1C 5.5 11/11/2022   HGBA1C 5.5 03/08/2022   HGBA1C 5.2 12/25/2020   HGBA1C 5.2 12/13/2019   HGBA1C 5.1 11/14/2018   Lab Results  Component Value Date   INSULIN 15.6 11/11/2022   INSULIN 28.3 (H) 03/08/2022   INSULIN 14.9 12/25/2020   INSULIN 16.5 12/13/2019   INSULIN 20.4 11/14/2018   Lab Results  Component Value Date   TSH 3.330 11/11/2022   Lab Results  Component Value Date   CHOL 143 11/11/2022  HDL 53 11/11/2022   LDLCALC 69 11/11/2022   TRIG 115 11/11/2022   CHOLHDL 3.1 10/18/2017   Lab Results  Component Value Date   VD25OH 85.0 11/11/2022   VD25OH 100.0 03/08/2022   VD25OH 49.2 12/25/2020   Lab Results  Component Value Date   WBC 11.6 (H) 06/28/2018   HGB 14.8 06/28/2018   HCT 43.8 06/28/2018   MCV 90 06/28/2018   PLT 375.0 02/03/2018   No results found for: "IRON", "TIBC", "FERRITIN"  Attestation Statements:   Reviewed by clinician on day of visit: allergies, medications, problem list, medical history, surgical history, family history,  social history, and previous encounter notes.   I, Burt Knack, am acting as transcriptionist for Quillian Quince, MD.  I have reviewed the above documentation for accuracy and completeness, and I agree with the above. -  Quillian Quince, MD

## 2023-02-15 ENCOUNTER — Other Ambulatory Visit (INDEPENDENT_AMBULATORY_CARE_PROVIDER_SITE_OTHER): Payer: Self-pay | Admitting: Family Medicine

## 2023-02-15 DIAGNOSIS — I10 Essential (primary) hypertension: Secondary | ICD-10-CM

## 2023-02-17 ENCOUNTER — Encounter (INDEPENDENT_AMBULATORY_CARE_PROVIDER_SITE_OTHER): Payer: Self-pay | Admitting: Family Medicine

## 2023-02-17 ENCOUNTER — Ambulatory Visit (INDEPENDENT_AMBULATORY_CARE_PROVIDER_SITE_OTHER): Payer: 59 | Admitting: Family Medicine

## 2023-02-17 VITALS — BP 138/87 | HR 90 | Temp 98.0°F | Ht 65.0 in | Wt 223.0 lb

## 2023-02-17 DIAGNOSIS — I10 Essential (primary) hypertension: Secondary | ICD-10-CM | POA: Diagnosis not present

## 2023-02-17 DIAGNOSIS — E669 Obesity, unspecified: Secondary | ICD-10-CM

## 2023-02-17 DIAGNOSIS — K219 Gastro-esophageal reflux disease without esophagitis: Secondary | ICD-10-CM

## 2023-02-17 DIAGNOSIS — Z6837 Body mass index (BMI) 37.0-37.9, adult: Secondary | ICD-10-CM | POA: Diagnosis not present

## 2023-02-17 MED ORDER — OMEPRAZOLE 40 MG PO CPDR: 40.0000 mg | DELAYED_RELEASE_CAPSULE | Freq: Every day | ORAL | 3 refills | Status: DC

## 2023-02-17 MED ORDER — LOSARTAN POTASSIUM 50 MG PO TABS: 50.0000 mg | ORAL_TABLET | Freq: Every day | ORAL | 0 refills | Status: DC

## 2023-02-21 NOTE — Progress Notes (Unsigned)
Chief Complaint:   OBESITY Erin Warren is here to discuss her progress with her obesity treatment plan along with follow-up of her obesity related diagnoses. Erin Warren is on the Category 2 Plan and states she is following her eating plan approximately 80% of the time. Erin Warren states she is doing yoga and physical therapy for 30 minutes 5 times per week.  Today's visit was #: 67 Starting weight: 240 lbs Starting date: 06/28/2018 Today's weight: 223 lbs Today's date: 02/17/2023 Total lbs lost to date: 17 Total lbs lost since last in-office visit: 1  Interim History: Patient continues to lose weight slowly but steadily.  She has increased her exercise.  She struggles to eat all of the protein on her plan.  Subjective:   1. Gastroesophageal reflux disease without esophagitis Patient notes GERD is not controlled, and also makes her feel more hunger signals.  2. Essential hypertension Patient's blood pressure is better controlled on Cozaar.  She denies chest pain, cough, or headache.  Assessment/Plan:   1. Gastroesophageal reflux disease without esophagitis Patient agreed to decrease famotidine to once daily, and start omeprazole 40 mg once daily with a 90-day supply.  We will reassess in 1 month.  - omeprazole (PRILOSEC) 40 MG capsule; Take 1 capsule (40 mg total) by mouth daily.  Dispense: 30 capsule; Refill: 3  2. Essential hypertension Patient will continue Cozaar 50 mg, and we will refill for 1 month.  - losartan (COZAAR) 50 MG tablet; Take 1 tablet (50 mg total) by mouth daily.  Dispense: 30 tablet; Refill: 0  3. BMI 37.0-37.9, adult  4. Obesity, Beginning BMI 39.94 Erin Warren is currently in the action stage of change. As such, her goal is to continue with weight loss efforts. She has agreed to the Category 2 Plan.   Exercise goals: As is.   Behavioral modification strategies: increasing lean protein intake.  Erin Warren has agreed to follow-up with our clinic in 4 weeks. She was  informed of the importance of frequent follow-up visits to maximize her success with intensive lifestyle modifications for her multiple health conditions.   Objective:   Blood pressure 138/87, pulse 90, temperature 98 F (36.7 C), height 5\' 5"  (1.651 m), weight 223 lb (101.2 kg), SpO2 98 %. Body mass index is 37.11 kg/m.  Lab Results  Component Value Date   CREATININE 0.82 11/11/2022   BUN 10 11/11/2022   NA 135 11/11/2022   K 3.7 11/11/2022   CL 97 11/11/2022   CO2 21 11/11/2022   Lab Results  Component Value Date   ALT 28 11/11/2022   AST 23 11/11/2022   ALKPHOS 111 11/11/2022   BILITOT <0.2 11/11/2022   Lab Results  Component Value Date   HGBA1C 5.5 11/11/2022   HGBA1C 5.5 03/08/2022   HGBA1C 5.2 12/25/2020   HGBA1C 5.2 12/13/2019   HGBA1C 5.1 11/14/2018   Lab Results  Component Value Date   INSULIN 15.6 11/11/2022   INSULIN 28.3 (H) 03/08/2022   INSULIN 14.9 12/25/2020   INSULIN 16.5 12/13/2019   INSULIN 20.4 11/14/2018   Lab Results  Component Value Date   TSH 3.330 11/11/2022   Lab Results  Component Value Date   CHOL 143 11/11/2022   HDL 53 11/11/2022   LDLCALC 69 11/11/2022   TRIG 115 11/11/2022   CHOLHDL 3.1 10/18/2017   Lab Results  Component Value Date   VD25OH 85.0 11/11/2022   VD25OH 100.0 03/08/2022   VD25OH 49.2 12/25/2020   Lab Results  Component Value  Date   WBC 11.6 (H) 06/28/2018   HGB 14.8 06/28/2018   HCT 43.8 06/28/2018   MCV 90 06/28/2018   PLT 375.0 02/03/2018   No results found for: "IRON", "TIBC", "FERRITIN"  Attestation Statements:   Reviewed by clinician on day of visit: allergies, medications, problem list, medical history, surgical history, family history, social history, and previous encounter notes.  I have personally spent 43 minutes total time today in preparation, patient care, and documentation for this visit, including the following: review of clinical lab tests; review of medical  tests/procedures/services.   I, Burt Knack, am acting as transcriptionist for Quillian Quince, MD.  I have reviewed the above documentation for accuracy and completeness, and I agree with the above. -  Quillian Quince, MD

## 2023-03-14 ENCOUNTER — Other Ambulatory Visit (INDEPENDENT_AMBULATORY_CARE_PROVIDER_SITE_OTHER): Payer: Self-pay | Admitting: Family Medicine

## 2023-03-14 DIAGNOSIS — I10 Essential (primary) hypertension: Secondary | ICD-10-CM

## 2023-03-16 ENCOUNTER — Other Ambulatory Visit (INDEPENDENT_AMBULATORY_CARE_PROVIDER_SITE_OTHER): Payer: Self-pay | Admitting: Family Medicine

## 2023-03-16 DIAGNOSIS — I10 Essential (primary) hypertension: Secondary | ICD-10-CM

## 2023-03-17 ENCOUNTER — Ambulatory Visit (INDEPENDENT_AMBULATORY_CARE_PROVIDER_SITE_OTHER): Payer: 59 | Admitting: Family Medicine

## 2023-03-31 ENCOUNTER — Encounter (INDEPENDENT_AMBULATORY_CARE_PROVIDER_SITE_OTHER): Payer: Self-pay | Admitting: Family Medicine

## 2023-03-31 ENCOUNTER — Ambulatory Visit (INDEPENDENT_AMBULATORY_CARE_PROVIDER_SITE_OTHER): Payer: 59 | Admitting: Family Medicine

## 2023-03-31 VITALS — BP 147/94 | HR 78 | Temp 98.3°F | Ht 65.0 in | Wt 234.0 lb

## 2023-03-31 DIAGNOSIS — I10 Essential (primary) hypertension: Secondary | ICD-10-CM | POA: Diagnosis not present

## 2023-03-31 DIAGNOSIS — E669 Obesity, unspecified: Secondary | ICD-10-CM | POA: Diagnosis not present

## 2023-03-31 DIAGNOSIS — K219 Gastro-esophageal reflux disease without esophagitis: Secondary | ICD-10-CM | POA: Diagnosis not present

## 2023-03-31 DIAGNOSIS — R7303 Prediabetes: Secondary | ICD-10-CM | POA: Diagnosis not present

## 2023-03-31 DIAGNOSIS — Z6838 Body mass index (BMI) 38.0-38.9, adult: Secondary | ICD-10-CM

## 2023-03-31 DIAGNOSIS — Z6839 Body mass index (BMI) 39.0-39.9, adult: Secondary | ICD-10-CM

## 2023-03-31 MED ORDER — LOSARTAN POTASSIUM 50 MG PO TABS: 50.0000 mg | ORAL_TABLET | Freq: Every day | ORAL | 0 refills | Status: DC

## 2023-03-31 MED ORDER — OMEPRAZOLE 40 MG PO CPDR: 40.0000 mg | DELAYED_RELEASE_CAPSULE | Freq: Every day | ORAL | 3 refills | Status: DC

## 2023-04-04 NOTE — Progress Notes (Signed)
Chief Complaint:   OBESITY Erin Warren is here to discuss her progress with her obesity treatment plan along with follow-up of her obesity related diagnoses. Erin Warren is on the Category 2 Plan and states she is following her eating plan approximately 75% of the time. Erin Warren states she is doing yoga for 20 minutes 5 times per week.  Today's visit was #: 68 Starting weight: 240 lbs Starting date: 06/28/2018 Today's weight: 234 lbs Today's date: 03/31/2023 Total lbs lost to date: 6 Total lbs lost since last in-office visit: 0  Interim History: Patient has gained a significant amount of weight over the last 6 weeks.  Approximately half is due to increased water retention, but half is adipose.  She has a variety of food allergies, which limits her choices.  She was on vacation for her birthday and she ate out more.  She is especially struggling with meal planning and prepping.  Subjective:   1. Essential hypertension Patient's blood pressure is elevated today.  She feels it is related to stress and she has been out of of losartan.  2. Gastroesophageal reflux disease without esophagitis Patient is on Prilosec, and she feels it is helping her symptoms.  No side effects were noted.  3. Prediabetes Patient is on metformin, and she is doing well with taking it regularly.  Assessment/Plan:   1. Essential hypertension Patient will continue losartan 50 mg once daily, and we will refill for 1 month.  We will recheck her blood pressure at her next visit in 1 month.  - losartan (COZAAR) 50 MG tablet; Take 1 tablet (50 mg total) by mouth daily.  Dispense: 30 tablet; Refill: 0  2. Gastroesophageal reflux disease without esophagitis Patient will continue Prilosec 40 mg once daily, and we will refill for 90 days.  - omeprazole (PRILOSEC) 40 MG capsule; Take 1 capsule (40 mg total) by mouth daily.  Dispense: 30 capsule; Refill: 3  3. Prediabetes Patient is to work on her nutrition as well as  metformin, and we will recheck labs in 1 to 2 months.  4. BMI 39.0-39.9,adult  5. Obesity, Beginning BMI 39.94 Erin Warren is currently in the action stage of change. As such, her goal is to continue with weight loss efforts. She has agreed to the Category 2 Plan.   Exercise goals: As is.   Behavioral modification strategies: meal planning and cooking strategies.  Erin Warren has agreed to follow-up with our clinic in 4 weeks. She was informed of the importance of frequent follow-up visits to maximize her success with intensive lifestyle modifications for her multiple health conditions.   Objective:   Blood pressure (!) 147/94, pulse 78, temperature 98.3 F (36.8 C), height 5\' 5"  (1.651 m), weight 234 lb (106.1 kg), SpO2 97%. Body mass index is 38.94 kg/m.  Lab Results  Component Value Date   CREATININE 0.82 11/11/2022   BUN 10 11/11/2022   NA 135 11/11/2022   K 3.7 11/11/2022   CL 97 11/11/2022   CO2 21 11/11/2022   Lab Results  Component Value Date   ALT 28 11/11/2022   AST 23 11/11/2022   ALKPHOS 111 11/11/2022   BILITOT <0.2 11/11/2022   Lab Results  Component Value Date   HGBA1C 5.5 11/11/2022   HGBA1C 5.5 03/08/2022   HGBA1C 5.2 12/25/2020   HGBA1C 5.2 12/13/2019   HGBA1C 5.1 11/14/2018   Lab Results  Component Value Date   INSULIN 15.6 11/11/2022   INSULIN 28.3 (H) 03/08/2022   INSULIN 14.9  12/25/2020   INSULIN 16.5 12/13/2019   INSULIN 20.4 11/14/2018   Lab Results  Component Value Date   TSH 3.330 11/11/2022   Lab Results  Component Value Date   CHOL 143 11/11/2022   HDL 53 11/11/2022   LDLCALC 69 11/11/2022   TRIG 115 11/11/2022   CHOLHDL 3.1 10/18/2017   Lab Results  Component Value Date   VD25OH 85.0 11/11/2022   VD25OH 100.0 03/08/2022   VD25OH 49.2 12/25/2020   Lab Results  Component Value Date   WBC 11.6 (H) 06/28/2018   HGB 14.8 06/28/2018   HCT 43.8 06/28/2018   MCV 90 06/28/2018   PLT 375.0 02/03/2018   No results found for: "IRON",  "TIBC", "FERRITIN"  Attestation Statements:   Reviewed by clinician on day of visit: allergies, medications, problem list, medical history, surgical history, family history, social history, and previous encounter notes.   I, Burt Knack, am acting as transcriptionist for Quillian Quince, MD.  I have reviewed the above documentation for accuracy and completeness, and I agree with the above. -  Quillian Quince, MD

## 2023-04-20 ENCOUNTER — Other Ambulatory Visit (INDEPENDENT_AMBULATORY_CARE_PROVIDER_SITE_OTHER): Payer: Self-pay | Admitting: Family Medicine

## 2023-04-20 DIAGNOSIS — I10 Essential (primary) hypertension: Secondary | ICD-10-CM

## 2023-04-27 ENCOUNTER — Ambulatory Visit (INDEPENDENT_AMBULATORY_CARE_PROVIDER_SITE_OTHER): Payer: 59 | Admitting: Family Medicine

## 2023-04-27 ENCOUNTER — Encounter (INDEPENDENT_AMBULATORY_CARE_PROVIDER_SITE_OTHER): Payer: Self-pay

## 2023-04-28 ENCOUNTER — Other Ambulatory Visit (INDEPENDENT_AMBULATORY_CARE_PROVIDER_SITE_OTHER): Payer: Self-pay | Admitting: Family Medicine

## 2023-04-28 DIAGNOSIS — I10 Essential (primary) hypertension: Secondary | ICD-10-CM

## 2023-05-05 ENCOUNTER — Ambulatory Visit (INDEPENDENT_AMBULATORY_CARE_PROVIDER_SITE_OTHER): Payer: 59 | Admitting: Family Medicine

## 2023-05-05 ENCOUNTER — Encounter (INDEPENDENT_AMBULATORY_CARE_PROVIDER_SITE_OTHER): Payer: Self-pay | Admitting: Family Medicine

## 2023-05-05 VITALS — BP 138/86 | HR 82 | Temp 98.2°F | Ht 65.0 in | Wt 222.0 lb

## 2023-05-05 DIAGNOSIS — K219 Gastro-esophageal reflux disease without esophagitis: Secondary | ICD-10-CM

## 2023-05-05 DIAGNOSIS — I10 Essential (primary) hypertension: Secondary | ICD-10-CM

## 2023-05-05 DIAGNOSIS — E669 Obesity, unspecified: Secondary | ICD-10-CM | POA: Diagnosis not present

## 2023-05-05 DIAGNOSIS — Z6836 Body mass index (BMI) 36.0-36.9, adult: Secondary | ICD-10-CM

## 2023-05-05 MED ORDER — OMEPRAZOLE 40 MG PO CPDR: 40.0000 mg | DELAYED_RELEASE_CAPSULE | Freq: Every day | ORAL | 3 refills | Status: DC

## 2023-05-05 MED ORDER — CHLORTHALIDONE 25 MG PO TABS: 25.0000 mg | ORAL_TABLET | Freq: Every day | ORAL | 0 refills | Status: DC

## 2023-05-05 MED ORDER — LOSARTAN POTASSIUM 50 MG PO TABS: 50.0000 mg | ORAL_TABLET | Freq: Every day | ORAL | 0 refills | Status: DC

## 2023-05-10 NOTE — Progress Notes (Signed)
Chief Complaint:   OBESITY Erin Warren is here to discuss her progress with her obesity treatment plan along with follow-up of her obesity related diagnoses. Erin Warren is on the Category 2 Plan and states she is following her eating plan approximately 90% of the time. Erin Warren states she is doing yoga for 20 minutes 5 times per week.  Today's visit was #: 69 Starting weight: 240 lbs Starting date: 06/28/2018 Today's weight: 222 lbs Today's date: 05/05/2023 Total lbs lost to date: 18 Total lbs lost since last in-office visit: 12  Interim History: Patient has done well with her weight loss.  She is doing especially well with meal planning and using chat GPT.  Her hunger is mostly controlled, and she is thinking about increasing her exercise.  Subjective:   1. Gastroesophageal reflux disease without esophagitis Patient is stable on Prilosec and she notes symptoms have improved with more weight loss.  2. Essential hypertension Patient's blood pressure is stable on her medications, and she continues to work on her diet and weight loss.  Assessment/Plan:   1. Gastroesophageal reflux disease without esophagitis Patient will continue Prilosec 40 mg once daily, and we will refill for 90 days.  - omeprazole (PRILOSEC) 40 MG capsule; Take 1 capsule (40 mg total) by mouth daily.  Dispense: 30 capsule; Refill: 3  2. Essential hypertension Patient will continue her medications, and we will refill losartan for 1 month and chlorthalidone for 90 days.  - losartan (COZAAR) 50 MG tablet; Take 1 tablet (50 mg total) by mouth daily.  Dispense: 30 tablet; Refill: 0 - chlorthalidone (HYGROTON) 25 MG tablet; Take 1 tablet (25 mg total) by mouth daily.  Dispense: 90 tablet; Refill: 0  3. BMI 36.0-36.9,adult  4. Obesity, Beginning BMI 39.94 Erin Warren is currently in the action stage of change. As such, her goal is to continue with weight loss efforts. She has agreed to the Category 2 Plan.   Exercise goals: As  is, and add strengthening.   Behavioral modification strategies: no skipping meals and meal planning and cooking strategies.  Erin Warren has agreed to follow-up with our clinic in 4 weeks. She was informed of the importance of frequent follow-up visits to maximize her success with intensive lifestyle modifications for her multiple health conditions.   Objective:   Blood pressure 138/86, pulse 82, temperature 98.2 F (36.8 C), height 5\' 5"  (1.651 m), weight 222 lb (100.7 kg), SpO2 95%. Body mass index is 36.94 kg/m.  Lab Results  Component Value Date   CREATININE 0.82 11/11/2022   BUN 10 11/11/2022   NA 135 11/11/2022   K 3.7 11/11/2022   CL 97 11/11/2022   CO2 21 11/11/2022   Lab Results  Component Value Date   ALT 28 11/11/2022   AST 23 11/11/2022   ALKPHOS 111 11/11/2022   BILITOT <0.2 11/11/2022   Lab Results  Component Value Date   HGBA1C 5.5 11/11/2022   HGBA1C 5.5 03/08/2022   HGBA1C 5.2 12/25/2020   HGBA1C 5.2 12/13/2019   HGBA1C 5.1 11/14/2018   Lab Results  Component Value Date   INSULIN 15.6 11/11/2022   INSULIN 28.3 (H) 03/08/2022   INSULIN 14.9 12/25/2020   INSULIN 16.5 12/13/2019   INSULIN 20.4 11/14/2018   Lab Results  Component Value Date   TSH 3.330 11/11/2022   Lab Results  Component Value Date   CHOL 143 11/11/2022   HDL 53 11/11/2022   LDLCALC 69 11/11/2022   TRIG 115 11/11/2022   CHOLHDL 3.1  10/18/2017   Lab Results  Component Value Date   VD25OH 85.0 11/11/2022   VD25OH 100.0 03/08/2022   VD25OH 49.2 12/25/2020   Lab Results  Component Value Date   WBC 11.6 (H) 06/28/2018   HGB 14.8 06/28/2018   HCT 43.8 06/28/2018   MCV 90 06/28/2018   PLT 375.0 02/03/2018   No results found for: "IRON", "TIBC", "FERRITIN"  Attestation Statements:   Reviewed by clinician on day of visit: allergies, medications, problem list, medical history, surgical history, family history, social history, and previous encounter notes.   I, Burt Knack,  am acting as transcriptionist for Quillian Quince, MD.  I have reviewed the above documentation for accuracy and completeness, and I agree with the above. -  Quillian Quince, MD

## 2023-06-09 ENCOUNTER — Ambulatory Visit (INDEPENDENT_AMBULATORY_CARE_PROVIDER_SITE_OTHER): Payer: 59 | Admitting: Family Medicine

## 2023-06-09 ENCOUNTER — Encounter (INDEPENDENT_AMBULATORY_CARE_PROVIDER_SITE_OTHER): Payer: Self-pay | Admitting: Family Medicine

## 2023-06-09 VITALS — BP 135/91 | HR 86 | Temp 97.8°F | Ht 65.0 in | Wt 220.0 lb

## 2023-06-09 DIAGNOSIS — E669 Obesity, unspecified: Secondary | ICD-10-CM

## 2023-06-09 DIAGNOSIS — Z6836 Body mass index (BMI) 36.0-36.9, adult: Secondary | ICD-10-CM | POA: Diagnosis not present

## 2023-06-09 DIAGNOSIS — F439 Reaction to severe stress, unspecified: Secondary | ICD-10-CM | POA: Diagnosis not present

## 2023-06-09 NOTE — Progress Notes (Signed)
Chief Complaint:   OBESITY Ever is here to discuss her progress with her obesity treatment plan along with follow-up of her obesity related diagnoses. Farrah is on the Category 2 Plan and states she is following her eating plan approximately 80% of the time. Lielle states she is Yoga for 15 minutes 5 times per week.  Today's visit was #: 70 Starting weight: 240 lbs Starting date: 06/28/2018 Today's weight: 220 lbs Today's date: 06/09/2023 Total lbs lost to date: 20 Total lbs lost since last in-office visit: 2  Interim History: Patient continues to work on meal planning and prepping. She has decreased carbohydrates and she is exercising regularly.   Subjective:   1. Stress Patient is under a lot of stress especially related to the health of her family.  She is working on [employer], [employer] eating behavior. No problems with her current medications.  Assessment/Plan:   1. Stress Emotional eating behavior strategies were discussed.  We will continue to monitor and patient is to continue with her Wellbutrin.  2. BMI 36.0-36.9,adult  3. Obesity, Beginning BMI 39.94 Marytza is currently in the action stage of change. As such, her goal is to continue with weight loss efforts. She has agreed to the Category 2 Plan.   Exercise goals: As is.   Behavioral modification strategies: meal planning and cooking strategies and emotional eating strategies.  Jamesa has agreed to follow-up with our clinic in 4 weeks. She was informed of the importance of frequent follow-up visits to maximize her success with intensive lifestyle modifications for her multiple health conditions.   Objective:   Blood pressure (!) 135/91, pulse 86, temperature 97.8 F (36.6 C), height 5\' 5"  (1.651 m), weight 220 lb (99.8 kg), SpO2 95%. Body mass index is 36.61 kg/m.  Lab Results  Component Value Date   CREATININE 0.82 11/11/2022   BUN 10 11/11/2022   NA 135 11/11/2022   K 3.7 11/11/2022   CL 97 11/11/2022    CO2 21 11/11/2022   Lab Results  Component Value Date   ALT 28 11/11/2022   AST 23 11/11/2022   ALKPHOS 111 11/11/2022   BILITOT <0.2 11/11/2022   Lab Results  Component Value Date   HGBA1C 5.5 11/11/2022   HGBA1C 5.5 03/08/2022   HGBA1C 5.2 12/25/2020   HGBA1C 5.2 12/13/2019   HGBA1C 5.1 11/14/2018   Lab Results  Component Value Date   INSULIN 15.6 11/11/2022   INSULIN 28.3 (H) 03/08/2022   INSULIN 14.9 12/25/2020   INSULIN 16.5 12/13/2019   INSULIN 20.4 11/14/2018   Lab Results  Component Value Date   TSH 3.330 11/11/2022   Lab Results  Component Value Date   CHOL 143 11/11/2022   HDL 53 11/11/2022   LDLCALC 69 11/11/2022   TRIG 115 11/11/2022   CHOLHDL 3.1 10/18/2017   Lab Results  Component Value Date   VD25OH 85.0 11/11/2022   VD25OH 100.0 03/08/2022   VD25OH 49.2 12/25/2020   Lab Results  Component Value Date   WBC 11.6 (H) 06/28/2018   HGB 14.8 06/28/2018   HCT 43.8 06/28/2018   MCV 90 06/28/2018   PLT 375.0 02/03/2018   No results found for: "IRON", "TIBC", "FERRITIN"  Attestation Statements:   Reviewed by clinician on day of visit: allergies, medications, problem list, medical history, surgical history, family history, social history, and previous encounter notes.  Time spent on visit including pre-visit chart review and post-visit care and charting was 30 minutes.   Trude Mcburney,  am acting as transcriptionist for Quillian Quince, MD.  I have reviewed the above documentation for accuracy and completeness, and I agree with the above. -  Quillian Quince, MD

## 2023-07-05 ENCOUNTER — Ambulatory Visit (INDEPENDENT_AMBULATORY_CARE_PROVIDER_SITE_OTHER): Payer: 59 | Admitting: Family Medicine

## 2023-07-05 ENCOUNTER — Encounter (INDEPENDENT_AMBULATORY_CARE_PROVIDER_SITE_OTHER): Payer: Self-pay | Admitting: Family Medicine

## 2023-07-05 VITALS — BP 139/93 | HR 90 | Temp 97.8°F | Ht 65.0 in | Wt 222.0 lb

## 2023-07-05 DIAGNOSIS — R7303 Prediabetes: Secondary | ICD-10-CM

## 2023-07-05 DIAGNOSIS — E669 Obesity, unspecified: Secondary | ICD-10-CM

## 2023-07-05 DIAGNOSIS — Z6837 Body mass index (BMI) 37.0-37.9, adult: Secondary | ICD-10-CM

## 2023-07-05 NOTE — Progress Notes (Unsigned)
Chief Complaint:   OBESITY Erin Warren is here to discuss her progress with her obesity treatment plan along with follow-up of her obesity related diagnoses. Jenett is on the Category 2 Plan and states she is following her eating plan approximately 80% of the time. Skylea states she is doing yoga for 15 minutes 5 times per week.  Today's visit was #: 71 Starting weight: 240 lbs Starting date: 06/28/2018 Today's weight: 222 lbs Today's date: 07/05/2023 Total lbs lost to date: 18 Total lbs lost since last in-office visit: 0  Interim History: Patient has had continued stress and she has not been able to concentrate on her weight loss as much.  She will be on vacation soon and she is open to discussing travel eating strategies.  Subjective:   1. Prediabetes Patient is on metformin twice daily, and she is still struggling with her weight loss at times.  She denies nausea or vomiting.   Assessment/Plan:   1. Prediabetes Patient will continue metformin, we may increase to 3 times daily at her next visit.  She will continue to work on diet strategies.  2. BMI 37.0-37.9, adult  3. Obesity, Beginning BMI 39.94 Kharlie is currently in the action stage of change. As such, her goal is to continue with weight loss efforts. She has agreed to the Category 2 Plan.   Exercise goals: As is.   Behavioral modification strategies: travel eating strategies and holiday eating strategies .  Angi has agreed to follow-up with our clinic in 4 weeks. She was informed of the importance of frequent follow-up visits to maximize her success with intensive lifestyle modifications for her multiple health conditions.   Objective:   Blood pressure (!) 139/93, pulse 90, temperature 97.8 F (36.6 C), height 5\' 5"  (1.651 m), weight 222 lb (100.7 kg), SpO2 97%. Body mass index is 36.94 kg/m.  Lab Results  Component Value Date   CREATININE 0.82 11/11/2022   BUN 10 11/11/2022   NA 135 11/11/2022   K 3.7  11/11/2022   CL 97 11/11/2022   CO2 21 11/11/2022   Lab Results  Component Value Date   ALT 28 11/11/2022   AST 23 11/11/2022   ALKPHOS 111 11/11/2022   BILITOT <0.2 11/11/2022   Lab Results  Component Value Date   HGBA1C 5.5 11/11/2022   HGBA1C 5.5 03/08/2022   HGBA1C 5.2 12/25/2020   HGBA1C 5.2 12/13/2019   HGBA1C 5.1 11/14/2018   Lab Results  Component Value Date   INSULIN 15.6 11/11/2022   INSULIN 28.3 (H) 03/08/2022   INSULIN 14.9 12/25/2020   INSULIN 16.5 12/13/2019   INSULIN 20.4 11/14/2018   Lab Results  Component Value Date   TSH 3.330 11/11/2022   Lab Results  Component Value Date   CHOL 143 11/11/2022   HDL 53 11/11/2022   LDLCALC 69 11/11/2022   TRIG 115 11/11/2022   CHOLHDL 3.1 10/18/2017   Lab Results  Component Value Date   VD25OH 85.0 11/11/2022   VD25OH 100.0 03/08/2022   VD25OH 49.2 12/25/2020   Lab Results  Component Value Date   WBC 11.6 (H) 06/28/2018   HGB 14.8 06/28/2018   HCT 43.8 06/28/2018   MCV 90 06/28/2018   PLT 375.0 02/03/2018   No results found for: "IRON", "TIBC", "FERRITIN"  Attestation Statements:   Reviewed by clinician on day of visit: allergies, medications, problem list, medical history, surgical history, family history, social history, and previous encounter notes.  Time spent on visit including pre-visit  chart review and post-visit care and charting was 30 minutes.   I, Burt Knack, am acting as transcriptionist for Quillian Quince, MD.  I have reviewed the above documentation for accuracy and completeness, and I agree with the above. -  Quillian Quince, MD

## 2023-07-28 ENCOUNTER — Other Ambulatory Visit (INDEPENDENT_AMBULATORY_CARE_PROVIDER_SITE_OTHER): Payer: Self-pay | Admitting: Family Medicine

## 2023-07-28 ENCOUNTER — Encounter (INDEPENDENT_AMBULATORY_CARE_PROVIDER_SITE_OTHER): Payer: Self-pay | Admitting: Physician Assistant

## 2023-07-28 ENCOUNTER — Ambulatory Visit (INDEPENDENT_AMBULATORY_CARE_PROVIDER_SITE_OTHER): Payer: 59 | Admitting: Physician Assistant

## 2023-07-28 VITALS — BP 139/88 | HR 93 | Temp 98.5°F | Ht 65.0 in | Wt 225.0 lb

## 2023-07-28 DIAGNOSIS — K219 Gastro-esophageal reflux disease without esophagitis: Secondary | ICD-10-CM

## 2023-07-28 DIAGNOSIS — F439 Reaction to severe stress, unspecified: Secondary | ICD-10-CM

## 2023-07-28 DIAGNOSIS — Z6837 Body mass index (BMI) 37.0-37.9, adult: Secondary | ICD-10-CM

## 2023-07-28 DIAGNOSIS — I10 Essential (primary) hypertension: Secondary | ICD-10-CM

## 2023-07-28 DIAGNOSIS — R7303 Prediabetes: Secondary | ICD-10-CM | POA: Diagnosis not present

## 2023-07-28 DIAGNOSIS — E559 Vitamin D deficiency, unspecified: Secondary | ICD-10-CM

## 2023-07-28 DIAGNOSIS — E669 Obesity, unspecified: Secondary | ICD-10-CM

## 2023-07-28 MED ORDER — LOSARTAN POTASSIUM 50 MG PO TABS
50.0000 mg | ORAL_TABLET | Freq: Every day | ORAL | 0 refills | Status: DC
Start: 2023-07-28 — End: 2023-11-01

## 2023-07-28 MED ORDER — OMEPRAZOLE 40 MG PO CPDR
40.0000 mg | DELAYED_RELEASE_CAPSULE | Freq: Every day | ORAL | 3 refills | Status: DC
Start: 2023-07-28 — End: 2023-11-01

## 2023-07-28 MED ORDER — CHLORTHALIDONE 25 MG PO TABS: 25.0000 mg | ORAL_TABLET | Freq: Every day | ORAL | 0 refills | Status: DC

## 2023-07-28 NOTE — Progress Notes (Signed)
SUBJECTIVE:  Chief Complaint: Obesity  Interim History: Erin Warren, a 47 year old female with a history of prediabetes, vitamin D deficiency, and hypertension, presents for a follow-up visit regarding her obesity treatment plan. She reports a recent family emergency, specifically the sudden passing of her father, which required her to travel out of town. This event has added significant stress to her life.  She has been experiencing some difficulty with sleep, often feeling more active at night and struggling to fall asleep before 10:30 or 11:00 PM. This sleep pattern has been challenging due to her early morning work schedule, which requires her to wake up at 5:30 AM.  Regarding her obesity treatment plan, she has been mindful of her diet, often bringing her own lunch to work. However, she has been consuming protein bars, which she is unsure about due to their sugar alcohol content.  In terms of medication, she is currently on Hygriton 25mg  daily and Losartan 50mg  daily for hypertension, and Omeprazole 40mg  daily for reflux. She reports no issues with these medications and has found the Omeprazole particularly helpful for her reflux.  Overall, she acknowledges the need for more physical activity in her routine and expresses interest in incorporating bedtime yoga to help with her sleep issues. She is also planning to stay in town for the upcoming holidays, intending to enjoy the day without overindulging.  Erin Warren is here to discuss her progress with her obesity treatment plan. She is on the Category 2 Plan and states she is following her eating plan approximately 70 % of the time. She states she is exercising Yoga 15 minutes 5 times per week.   OBJECTIVE: Visit Diagnoses: Problem List Items Addressed This Visit     GERD (gastroesophageal reflux disease)   Relevant Medications   omeprazole (PRILOSEC) 40 MG capsule   Essential hypertension   Relevant Medications   chlorthalidone  (HYGROTON) 25 MG tablet   losartan (COZAAR) 50 MG tablet   Prediabetes - Primary   Obesity, Beginning BMI   Vitamin D deficiency (Chronic)   Other Visit Diagnoses     Stress         Obesity Increased stress due to recent family emergency and travel has impacted dietary habits and weight management. Discussed strategies for mindful eating during the holidays, including portion control and maintaining a balanced diet. Advised walking within 30 minutes of eating to manage blood sugar levels. Recommended avoiding processed foods and sugar alcohols due to potential gastrointestinal impact. - Encourage mindful eating during Thanksgiving - Recommend half of the plate be protein - Advise walking within 30 minutes of eating to manage blood sugar levels - Recommend avoiding processed foods and sugar alcohols  Hypertension Currently on Hygriton 25 mg daily and losartan 50 mg daily with no reported issues. - Refill/continue Hygriton 25 mg daily - Refill/continue losartan 50 mg daily Continue to work on nutrition plan to promote weight loss and improve BP control.   Gastroesophageal Reflux Disease (GERD) On omeprazole 40 mg daily, effectively managing symptoms. - Refill/continue omeprazole 40 mg daily  Sleep Disturbance Difficulty sleeping likely due to being a night owl and work-related stress/personal stressors. Discussed potential benefits of bedtime yoga to improve sleep quality. - Recommend bedtime yoga with Hansel Starling on YouTube  General Health Maintenance Discussed the importance of maintaining a healthy diet and regular exercise. Currently practices yoga but needs to incorporate more physical activity. - Encourage regular exercise post-New Year's  Follow-up - Schedule follow-up appointment in 4-6 weeks with Dr. Dalbert Garnet.  No data recorded  No data recorded  No data recorded  No data recorded    ASSESSMENT AND PLAN:  Diet: Erin Warren is currently in the action stage of change.  As such, her goal is to continue with weight loss efforts. She has agreed to Category 2 Plan.  Exercise: Erin Warren has been instructed that some exercise is better than none for weight loss and overall health benefits.   Behavior Modification:  We discussed the following Behavioral Modification Strategies today: increasing lean protein intake, decreasing simple carbohydrates, increasing vegetables, increase H2O intake, increase high fiber foods, meal planning and cooking strategies, emotional eating strategies , and holiday eating strategies. We discussed various medication options to help Erin Warren with her weight loss efforts and we both agreed to continue to work on nutritional and behavioral strategies to promote weight loss.  .  No follow-ups on file.[name] She was informed of the importance of frequent follow up visits to maximize her success with intensive lifestyle modifications for her multiple health conditions.  Attestation Statements:   Reviewed by clinician on day of visit: allergies, medications, problem list, medical history, surgical history, family history, social history, and previous encounter notes.   Time spent on visit including pre-visit chart review and post-visit care and charting was 32 minutes.    Tyshan Enderle, PA-C

## 2023-09-01 ENCOUNTER — Encounter (INDEPENDENT_AMBULATORY_CARE_PROVIDER_SITE_OTHER): Payer: Self-pay | Admitting: Family Medicine

## 2023-09-01 ENCOUNTER — Ambulatory Visit (INDEPENDENT_AMBULATORY_CARE_PROVIDER_SITE_OTHER): Payer: 59 | Admitting: Family Medicine

## 2023-09-01 VITALS — BP 156/99 | HR 96 | Temp 98.6°F | Ht 65.0 in | Wt 225.0 lb

## 2023-09-01 DIAGNOSIS — Z6837 Body mass index (BMI) 37.0-37.9, adult: Secondary | ICD-10-CM

## 2023-09-01 DIAGNOSIS — E669 Obesity, unspecified: Secondary | ICD-10-CM

## 2023-09-01 DIAGNOSIS — R7303 Prediabetes: Secondary | ICD-10-CM | POA: Diagnosis not present

## 2023-09-01 DIAGNOSIS — I10 Essential (primary) hypertension: Secondary | ICD-10-CM

## 2023-09-01 DIAGNOSIS — R600 Localized edema: Secondary | ICD-10-CM

## 2023-09-01 NOTE — Progress Notes (Signed)
.smr  Office: [phone]  /  Fax: [phone]  WEIGHT SUMMARY AND BIOMETRICS  Anthropometric Measurements Height: 5\' 5"  (1.651 m) Weight: 225 lb (102.1 kg) BMI (Calculated): 37.44 Weight at Last Visit: 225 lb Weight Lost Since Last Visit: 0 Weight Gained Since Last Visit: 0 Starting Weight: 240 lb Total Weight Loss (lbs): 15 lb (6.804 kg) Peak Weight: 250 lb   Body Composition  Body Fat %: 46 % Fat Mass (lbs): 103.8 lbs Total Body Water (lbs): 115.6 lbs Visceral Fat Rating : 13   Other Clinical Data Fasting: No Labs: No Today's Visit #: 12 Starting Date: 06/28/18    Chief Complaint: OBESITY  History of Present Illness   The patient, with a history of hypertension and obesity, presents for a follow-up visit. Her blood pressure readings today were elevated at 155/96 and 156/99, despite being on chlorthalidone, losartan, and propranolol. She reports maintaining her weight over the holiday period and adhering to her category two eating plan approximately 70% of the time. She has incorporated yoga into her routine, performing 15-minute sessions five times per week.  The patient has been experiencing increased stress due to personal circumstances, which she believes may be contributing to her elevated blood pressure. She reports good medication adherence, missing doses only about once a month. She recently started using compression socks due to concerns about lower extremity edema, which she has noticed for a long time but feels has been worsening. She denies any dietary changes that could contribute to this.  Despite the edema, the patient denies any shortness of breath or worsening of her usual respiratory status. She has not been monitoring her blood pressure at home but agrees to start doing so twice a week. She also expresses a desire to increase her physical activity, possibly by using a stepper she has at home. She has been able to maintain a daily writing routine and has  been engaging in felting as a hobby.  The patient is also on metformin for an unspecified condition and reports no issues with feeling hot, weak, or shaky, which could indicate hypoglycemia. She has been experiencing night sweats, which she attributes to perimenopause. She expresses a desire to increase her physical activity, possibly by using a stepper she has at home.          PHYSICAL EXAM:  Blood pressure (!) 156/99, pulse 96, temperature 98.6 F (37 C), height 5\' 5"  (1.651 m), weight 225 lb (102.1 kg), SpO2 95%. Body mass index is 37.44 kg/m.  DIAGNOSTIC DATA REVIEWED:  BMET    Component Value Date/Time   NA 135 11/11/2022 1016   K 3.7 11/11/2022 1016   CL 97 11/11/2022 1016   CO2 21 11/11/2022 1016   GLUCOSE 75 11/11/2022 1016   GLUCOSE 80 02/10/2016 1735   BUN 10 11/11/2022 1016   CREATININE 0.82 11/11/2022 1016   CREATININE 0.67 02/10/2016 1735   CALCIUM 10.0 11/11/2022 1016   GFRNONAA 85 12/13/2019 1628   GFRAA 98 12/13/2019 1628   Lab Results  Component Value Date   HGBA1C 5.5 11/11/2022   HGBA1C 4.7 09/27/2013   Lab Results  Component Value Date   INSULIN 15.6 11/11/2022   INSULIN 14.2 06/28/2018   Lab Results  Component Value Date   TSH 3.330 11/11/2022   CBC    Component Value Date/Time   WBC 11.6 (H) 06/28/2018 1037   WBC 11.6 (H) 02/03/2018 1620   RBC 4.85 06/28/2018 1037   RBC 4.72 02/03/2018 1620   HGB  14.8 06/28/2018 1037   HCT 43.8 06/28/2018 1037   PLT 375.0 02/03/2018 1620   PLT 435 (H) 10/18/2017 1640   MCV 90 06/28/2018 1037   MCH 30.5 06/28/2018 1037   MCH 31.3 02/04/2015 1109   MCHC 33.8 06/28/2018 1037   MCHC 34.1 02/03/2018 1620   RDW 13.2 06/28/2018 1037   Iron Studies No results found for: "IRON", "TIBC", "FERRITIN", "IRONPCTSAT" Lipid Panel     Component Value Date/Time   CHOL 143 11/11/2022 1016   TRIG 115 11/11/2022 1016   HDL 53 11/11/2022 1016   CHOLHDL 3.1 10/18/2017 1640   CHOLHDL 2.7 02/04/2015 1109   VLDL  23 02/04/2015 1109   LDLCALC 69 11/11/2022 1016   Hepatic Function Panel     Component Value Date/Time   PROT 7.1 11/11/2022 1016   ALBUMIN 4.2 11/11/2022 1016   AST 23 11/11/2022 1016   ALT 28 11/11/2022 1016   ALKPHOS 111 11/11/2022 1016   BILITOT <0.2 11/11/2022 1016      Component Value Date/Time   TSH 3.330 11/11/2022 1016   Nutritional Lab Results  Component Value Date   VD25OH 85.0 11/11/2022   VD25OH 100.0 03/08/2022   VD25OH 49.2 12/25/2020     Assessment and Plan    Hypertension Hypertension with readings of 155/96 and 156/99. On chlorthalidone, losartan, and propranolol. Possible stress-related elevation. Reports good adherence with occasional missed doses. Discussed home blood pressure monitoring and proper technique. - Check blood pressure at home twice a week - Ensure proper technique for blood pressure measurement - Schedule next appointment  LE edema, Bilateral 1+ pitting edema, likely related to dietary sodium intake and family history. No shortness of breath or other signs of worsening condition. On chlorthalidone for fluid retention. Discussed potential causes including dietary sodium and hormonal factors. - Monitor for worsening symptoms such as shortness of breath - Continue current medications - Consider dietary sodium reduction  Obesity and prediabetes Obesity with stable weight over the last five weeks despite holiday season. Follows a category two eating plan 70% of the time and engages in yoga for 15 minutes five times per week. Discussed benefits of current regimen and encouraged gradual increase in exercise duration. - Continue current diet and exercise regimen - Encourage use of stepper and gradual increase in duration - Explore additional home exercise options  General Health Maintenance Managing health well with current medications and lifestyle modifications. Discussed importance of monitoring for signs of low blood sugar and refilling  metformin as needed. - Refill metformin as needed - Monitor for signs of low blood sugar - Schedule next appointment  Follow-up - Schedule next appointment after January 15th.          She was informed of the importance of frequent follow up visits to maximize her success with intensive lifestyle modifications for her multiple health conditions.    Quillian Quince, MD

## 2023-10-06 ENCOUNTER — Ambulatory Visit (INDEPENDENT_AMBULATORY_CARE_PROVIDER_SITE_OTHER): Payer: 59 | Admitting: Family Medicine

## 2023-10-06 ENCOUNTER — Encounter (INDEPENDENT_AMBULATORY_CARE_PROVIDER_SITE_OTHER): Payer: Self-pay | Admitting: Family Medicine

## 2023-10-06 VITALS — BP 159/95 | HR 97 | Temp 98.8°F | Ht 65.0 in | Wt 227.0 lb

## 2023-10-06 DIAGNOSIS — E669 Obesity, unspecified: Secondary | ICD-10-CM | POA: Diagnosis not present

## 2023-10-06 DIAGNOSIS — E876 Hypokalemia: Secondary | ICD-10-CM | POA: Diagnosis not present

## 2023-10-06 DIAGNOSIS — I10 Essential (primary) hypertension: Secondary | ICD-10-CM

## 2023-10-06 DIAGNOSIS — Z6839 Body mass index (BMI) 39.0-39.9, adult: Secondary | ICD-10-CM | POA: Diagnosis not present

## 2023-10-06 MED ORDER — CHLORTHALIDONE 25 MG PO TABS
25.0000 mg | ORAL_TABLET | Freq: Every day | ORAL | 0 refills | Status: DC
Start: 2023-10-06 — End: 2023-11-29

## 2023-10-06 NOTE — Progress Notes (Signed)
.smr  Office: [phone]  /  Fax: [phone]  WEIGHT SUMMARY AND BIOMETRICS  Anthropometric Measurements Height: 5\' 5"  (1.651 m) Weight: 227 lb (103 kg) BMI (Calculated): 37.77 Weight at Last Visit: 225 lb Weight Lost Since Last Visit: 0 Weight Gained Since Last Visit: 2 lb Starting Weight: 240 lb Total Weight Loss (lbs): 13 lb (5.897 kg) Peak Weight: 250 lb   Body Composition  Body Fat %: 47.1 % Fat Mass (lbs): 107 lbs Muscle Mass (lbs): 114.2 lbs Total Body Water (lbs): 80.8 lbs Visceral Fat Rating : 13   Other Clinical Data Fasting: no Labs: Yes Today's Visit #: 59 Starting Date: 06/28/18    Chief Complaint: OBESITY   Discussed the use of AI scribe software for clinical note transcription with the patient, who gave verbal consent to proceed.  History of Present Illness   The patient, with obesity and hypertension, presents for management of elevated blood pressure and weight gain.  She has been experiencing elevated blood pressure readings, with today's measurements at 140/97 and 159/95. She is currently on chlorthalidone, which may contribute to her low potassium levels. She forgets to check her blood pressure at home regularly. Her potassium levels were noted to be low during a recent visit with another provider, and she has not yet scheduled a retest due to other commitments.  She has gained two pounds in the last month since her last visit. She is following her category two eating plan about 85% of the time and is doing yoga for exercise for about 15 minutes, five times a week. She is trying to incorporate more exercise into her routine, such as using hacky sacks as weights and walking in place with a stepper.  She describes her hunger as being on the higher side lately, which she attributes to physical hunger and possibly her acid problem. She tries to keep healthy snacks at work but receives food from her mother, which may not always be healthy. She is  mindful of her protein intake but acknowledges it may not be sufficient. She finds meal planning and prepping challenging when her schedule is disrupted, but she is attempting to improve this aspect. She is experiencing stress at home but reports that work stress is manageable.          PHYSICAL EXAM:  Blood pressure (!) 159/95, pulse 97, temperature 98.8 F (37.1 C), height 5\' 5"  (1.651 m), weight 227 lb (103 kg), SpO2 98%. Body mass index is 37.77 kg/m.  DIAGNOSTIC DATA REVIEWED:  BMET    Component Value Date/Time   NA 135 11/11/2022 1016   K 3.7 11/11/2022 1016   CL 97 11/11/2022 1016   CO2 21 11/11/2022 1016   GLUCOSE 75 11/11/2022 1016   GLUCOSE 80 02/10/2016 1735   BUN 10 11/11/2022 1016   CREATININE 0.82 11/11/2022 1016   CREATININE 0.67 02/10/2016 1735   CALCIUM 10.0 11/11/2022 1016   GFRNONAA 85 12/13/2019 1628   GFRAA 98 12/13/2019 1628   Lab Results  Component Value Date   HGBA1C 5.5 11/11/2022   HGBA1C 4.7 09/27/2013   Lab Results  Component Value Date   INSULIN 15.6 11/11/2022   INSULIN 14.2 06/28/2018   Lab Results  Component Value Date   TSH 3.330 11/11/2022   CBC    Component Value Date/Time   WBC 11.6 (H) 06/28/2018 1037   WBC 11.6 (H) 02/03/2018 1620   RBC 4.85 06/28/2018 1037   RBC 4.72 02/03/2018 1620   HGB 14.8 06/28/2018 1037  HCT 43.8 06/28/2018 1037   PLT 375.0 02/03/2018 1620   PLT 435 (H) 10/18/2017 1640   MCV 90 06/28/2018 1037   MCH 30.5 06/28/2018 1037   MCH 31.3 02/04/2015 1109   MCHC 33.8 06/28/2018 1037   MCHC 34.1 02/03/2018 1620   RDW 13.2 06/28/2018 1037   Iron Studies No results found for: "IRON", "TIBC", "FERRITIN", "IRONPCTSAT" Lipid Panel     Component Value Date/Time   CHOL 143 11/11/2022 1016   TRIG 115 11/11/2022 1016   HDL 53 11/11/2022 1016   CHOLHDL 3.1 10/18/2017 1640   CHOLHDL 2.7 02/04/2015 1109   VLDL 23 02/04/2015 1109   LDLCALC 69 11/11/2022 1016   Hepatic Function Panel     Component  Value Date/Time   PROT 7.1 11/11/2022 1016   ALBUMIN 4.2 11/11/2022 1016   AST 23 11/11/2022 1016   ALT 28 11/11/2022 1016   ALKPHOS 111 11/11/2022 1016   BILITOT <0.2 11/11/2022 1016      Component Value Date/Time   TSH 3.330 11/11/2022 1016   Nutritional Lab Results  Component Value Date   VD25OH 85.0 11/11/2022   VD25OH 100.0 03/08/2022   VD25OH 49.2 12/25/2020     Assessment and Plan    Hypertension Hypertension with elevated readings today (140/97 and 159/95). Currently on chlorthalidone, which may be contributing to hypokalemia. Discussed potential medication change if electrolyte imbalances persist. Emphasized home blood pressure monitoring and stress management. - Order potassium lab test - Monitor blood pressure at home - Consider medication adjustment based on lab results  Hypokalemia Possible hypokalemia secondary to chlorthalidone use. Advised to increase dietary potassium intake and provided a list of high-potassium foods. Discussed importance of monitoring potassium levels. - Order potassium lab test - Provide list of high-potassium foods - Monitor potassium levels  Obesity Obesity with recent weight gain of 2 pounds over the last month. Following a category two eating plan 85% of the time and engaging in yoga. Emphasized protein intake to manage hunger and metabolism, and discussed stress impact on eating habits. Discussed meal prepping and planning strategies. - Continue category two eating plan - Increase protein intake - Focus on meal prepping and planning - Continue yoga and other physical activities  General Health Maintenance Due for a potassium retest as recommended by another provider. Provided dietary advice to manage potassium levels. - Order potassium lab test - Provide dietary advice for potassium intake  Follow-up - Follow-up appointment on February 25th - Coordinate future appointments for convenience.       She was informed of the  importance of frequent follow up visits to maximize her success with intensive lifestyle modifications for her multiple health conditions.    Quillian Quince, MD

## 2023-10-07 LAB — CMP14+EGFR
ALT: 22 [IU]/L (ref 0–32)
AST: 20 [IU]/L (ref 0–40)
Albumin: 4.1 g/dL (ref 3.9–4.9)
Alkaline Phosphatase: 113 [IU]/L (ref 44–121)
BUN/Creatinine Ratio: 22 (ref 9–23)
BUN: 18 mg/dL (ref 6–24)
Bilirubin Total: <0.2 mg/dL (ref 0.0–1.2)
CO2: 21 mmol/L (ref 20–29)
Calcium: 10.3 mg/dL — ABNORMAL HIGH (ref 8.7–10.2)
Chloride: 102 mmol/L (ref 96–106)
Creatinine, Ser: 0.83 mg/dL (ref 0.57–1.00)
Globulin, Total: 3 g/dL (ref 1.5–4.5)
Glucose: 99 mg/dL (ref 70–99)
Potassium: 4.3 mmol/L (ref 3.5–5.2)
Sodium: 141 mmol/L (ref 134–144)
Total Protein: 7.1 g/dL (ref 6.0–8.5)
eGFR: 86 mL/min/{1.73_m2} (ref >59)

## 2023-10-11 ENCOUNTER — Other Ambulatory Visit: Payer: Self-pay | Admitting: Diagnostic Neuroimaging

## 2023-10-11 DIAGNOSIS — I10 Essential (primary) hypertension: Secondary | ICD-10-CM

## 2023-10-11 NOTE — Telephone Encounter (Signed)
Last seen on 10/12/22 No follow up scheduled ( Return in about 1 year (around 10/13/2023) for MyChart visit (15 min).

## 2023-10-18 ENCOUNTER — Telehealth: Payer: Self-pay | Admitting: Diagnostic Neuroimaging

## 2023-10-18 NOTE — Telephone Encounter (Signed)
Pt understands that although there may be some limitations with this type of visit, we will take all precautions to reduce any security or privacy concerns.  Pt understands that this will be treated like an in office visit and we will file with pt's insurance, and there may be a patient responsible charge related to this service.  1 yr f/u made as my chart vv with Soto,Michael , he confirmed no new issues

## 2023-10-29 ENCOUNTER — Other Ambulatory Visit (INDEPENDENT_AMBULATORY_CARE_PROVIDER_SITE_OTHER): Payer: Self-pay | Admitting: Physician Assistant

## 2023-10-29 DIAGNOSIS — I10 Essential (primary) hypertension: Secondary | ICD-10-CM

## 2023-11-01 ENCOUNTER — Encounter (INDEPENDENT_AMBULATORY_CARE_PROVIDER_SITE_OTHER): Payer: Self-pay | Admitting: Family Medicine

## 2023-11-01 ENCOUNTER — Ambulatory Visit (INDEPENDENT_AMBULATORY_CARE_PROVIDER_SITE_OTHER): Payer: 59 | Admitting: Family Medicine

## 2023-11-01 VITALS — BP 137/73 | HR 92 | Temp 97.9°F | Ht 65.0 in | Wt 231.0 lb

## 2023-11-01 DIAGNOSIS — I10 Essential (primary) hypertension: Secondary | ICD-10-CM | POA: Diagnosis not present

## 2023-11-01 DIAGNOSIS — R12 Heartburn: Secondary | ICD-10-CM | POA: Diagnosis not present

## 2023-11-01 DIAGNOSIS — E669 Obesity, unspecified: Secondary | ICD-10-CM | POA: Diagnosis not present

## 2023-11-01 DIAGNOSIS — Z6838 Body mass index (BMI) 38.0-38.9, adult: Secondary | ICD-10-CM

## 2023-11-01 DIAGNOSIS — K219 Gastro-esophageal reflux disease without esophagitis: Secondary | ICD-10-CM

## 2023-11-01 MED ORDER — OMEPRAZOLE 40 MG PO CPDR: 40.0000 mg | DELAYED_RELEASE_CAPSULE | Freq: Every day | ORAL | 3 refills | Status: DC

## 2023-11-01 MED ORDER — LOSARTAN POTASSIUM 50 MG PO TABS: 50.0000 mg | ORAL_TABLET | Freq: Every day | ORAL | 0 refills | Status: DC

## 2023-11-01 NOTE — Progress Notes (Signed)
 .smr  Office: [phone]  /  Fax: [phone]  WEIGHT SUMMARY AND BIOMETRICS  Anthropometric Measurements Height: 5\' 5"  (1.651 m) Weight: 231 lb (104.8 kg) BMI (Calculated): 38.44 Weight at Last Visit: 227 lb Weight Lost Since Last Visit: 0 Weight Gained Since Last Visit: 4 lb Starting Weight: 240 lb Total Weight Loss (lbs): 19 lb (8.618 kg) Peak Weight: 250 lb   Body Composition  Body Fat %: 47 % Fat Mass (lbs): 109 lbs Muscle Mass (lbs): 116.6 lbs Total Body Water (lbs): 83.6 lbs Visceral Fat Rating : 13   Other Clinical Data Labs: no Today's Visit #: 75 Starting Date: 06/28/18    Chief Complaint: OBESITY   History of Present Illness   Erin Warren is a 48 year old female with obesity and hypertension who presents to discuss her obesity treatment plan and assess her progress.  She has gained four pounds in the last month since her last visit. She follows the category two eating plan about 80% of the time. Meal planning and preparation have been challenging due to recent stressors, including a court date and family events. She admits to some stress eating, including consuming cinnamon jelly hearts and celebrating with sushi and chicken dishes. She is trying to manage her weight by incorporating yoga and walking for exercise, about 15 minutes five times per week.  She has a history of hypertension and her current medications include losartan, propranolol, and chlorthalidone. Her blood pressure is generally well-controlled.  She has been experiencing foot issues and recently saw an orthopedist who diagnosed her with a neuroma. She received cortisone injections in each foot to address the pain. The injections were not as painful as previous plantar fascia injections. She is hopeful that this treatment will help her continue with her exercise routine.  She experiences heartburn, which is generally well-controlled except during hormonal times, particularly the first two  days of her menstrual cycle. She takes Prilosec for this condition.          PHYSICAL EXAM:  Blood pressure 137/73, pulse 92, temperature 97.9 F (36.6 C), height 5\' 5"  (1.651 m), weight 231 lb (104.8 kg), SpO2 95%. Body mass index is 38.44 kg/m.  DIAGNOSTIC DATA REVIEWED:  BMET    Component Value Date/Time   NA 141 10/06/2023 1434   K 4.3 10/06/2023 1434   CL 102 10/06/2023 1434   CO2 21 10/06/2023 1434   GLUCOSE 99 10/06/2023 1434   GLUCOSE 80 02/10/2016 1735   BUN 18 10/06/2023 1434   CREATININE 0.83 10/06/2023 1434   CREATININE 0.67 02/10/2016 1735   CALCIUM 10.3 (H) 10/06/2023 1434   GFRNONAA 85 12/13/2019 1628   GFRAA 98 12/13/2019 1628   Lab Results  Component Value Date   HGBA1C 5.5 11/11/2022   HGBA1C 4.7 09/27/2013   Lab Results  Component Value Date   INSULIN 15.6 11/11/2022   INSULIN 14.2 06/28/2018   Lab Results  Component Value Date   TSH 3.330 11/11/2022   CBC    Component Value Date/Time   WBC 11.6 (H) 06/28/2018 1037   WBC 11.6 (H) 02/03/2018 1620   RBC 4.85 06/28/2018 1037   RBC 4.72 02/03/2018 1620   HGB 14.8 06/28/2018 1037   HCT 43.8 06/28/2018 1037   PLT 375.0 02/03/2018 1620   PLT 435 (H) 10/18/2017 1640   MCV 90 06/28/2018 1037   MCH 30.5 06/28/2018 1037   MCH 31.3 02/04/2015 1109   MCHC 33.8 06/28/2018 1037   MCHC 34.1 02/03/2018 1620  RDW 13.2 06/28/2018 1037   Iron Studies No results found for: "IRON", "TIBC", "FERRITIN", "IRONPCTSAT" Lipid Panel     Component Value Date/Time   CHOL 143 11/11/2022 1016   TRIG 115 11/11/2022 1016   HDL 53 11/11/2022 1016   CHOLHDL 3.1 10/18/2017 1640   CHOLHDL 2.7 02/04/2015 1109   VLDL 23 02/04/2015 1109   LDLCALC 69 11/11/2022 1016   Hepatic Function Panel     Component Value Date/Time   PROT 7.1 10/06/2023 1434   ALBUMIN 4.1 10/06/2023 1434   AST 20 10/06/2023 1434   ALT 22 10/06/2023 1434   ALKPHOS 113 10/06/2023 1434   BILITOT <0.2 10/06/2023 1434      Component  Value Date/Time   TSH 3.330 11/11/2022 1016   Nutritional Lab Results  Component Value Date   VD25OH 85.0 11/11/2022   VD25OH 100.0 03/08/2022   VD25OH 49.2 12/25/2020     Assessment and Plan    Obesity Patient has gained 4 pounds in the last month. She follows the category two eating plan 80% of the time and engages in yoga and walking for 15 minutes five times per week. Stress and recent events likely contributed to weight gain. Patient prefers non-surgical interventions for weight management. - Continue category two eating plan - Encourage regular exercise, including yoga and walking - Discuss meal planning and stress management strategies  Hypertension Blood pressure today is 137/73, well-controlled with losartan, propranolol, and chlorthalidone. Discussed medication adherence and potential side effects. - Refill losartan - Continue category two eating plan - Encourage regular exercise, including yoga and walking - Discuss meal planning and stress management strategies  Heartburn Heartburn is generally well-controlled except during the first two days of PMS. Stress may also contribute to symptoms. Discussed stress management and dietary modifications. - Refill Prilosec - Continue category two eating plan  General Health Maintenance Encouraged to maintain physical activity and healthy eating habits. Discussed stress management. Patient is working on her winter menu and managing high stress due to personal circumstances. - Encourage continued physical activity - Discuss stress management techniques - Monitor weight and dietary habits  Follow-up - Follow up in four weeks.        She was informed of the importance of frequent follow up visits to maximize her success with intensive lifestyle modifications for her multiple health conditions.    Quillian Quince, MD

## 2023-11-29 ENCOUNTER — Encounter (INDEPENDENT_AMBULATORY_CARE_PROVIDER_SITE_OTHER): Payer: Self-pay | Admitting: Family Medicine

## 2023-11-29 ENCOUNTER — Other Ambulatory Visit (INDEPENDENT_AMBULATORY_CARE_PROVIDER_SITE_OTHER): Payer: Self-pay | Admitting: Family Medicine

## 2023-11-29 ENCOUNTER — Ambulatory Visit (INDEPENDENT_AMBULATORY_CARE_PROVIDER_SITE_OTHER): Payer: 59 | Admitting: Family Medicine

## 2023-11-29 VITALS — BP 139/94 | HR 77 | Temp 98.0°F | Ht 65.0 in | Wt 232.0 lb

## 2023-11-29 DIAGNOSIS — K219 Gastro-esophageal reflux disease without esophagitis: Secondary | ICD-10-CM

## 2023-11-29 DIAGNOSIS — E669 Obesity, unspecified: Secondary | ICD-10-CM

## 2023-11-29 DIAGNOSIS — I10 Essential (primary) hypertension: Secondary | ICD-10-CM

## 2023-11-29 DIAGNOSIS — F3289 Other specified depressive episodes: Secondary | ICD-10-CM

## 2023-11-29 DIAGNOSIS — F5089 Other specified eating disorder: Secondary | ICD-10-CM

## 2023-11-29 DIAGNOSIS — Z6838 Body mass index (BMI) 38.0-38.9, adult: Secondary | ICD-10-CM

## 2023-11-29 MED ORDER — OMEPRAZOLE 40 MG PO CPDR: 40.0000 mg | DELAYED_RELEASE_CAPSULE | Freq: Every day | ORAL | 0 refills | Status: DC

## 2023-11-29 MED ORDER — LOSARTAN POTASSIUM 50 MG PO TABS: 50.0000 mg | ORAL_TABLET | Freq: Every day | ORAL | 0 refills | Status: DC

## 2023-11-29 MED ORDER — BUPROPION HCL ER (XL) 300 MG PO TB24: 300.0000 mg | ORAL_TABLET | Freq: Every morning | ORAL | 0 refills | Status: DC

## 2023-11-29 MED ORDER — CHLORTHALIDONE 25 MG PO TABS: 25.0000 mg | ORAL_TABLET | Freq: Every day | ORAL | 0 refills | Status: DC

## 2023-11-29 NOTE — Progress Notes (Signed)
 Office: [phone]  /  Fax: [phone]  WEIGHT SUMMARY AND BIOMETRICS  Anthropometric Measurements Height: 5\' 5"  (1.651 m) Weight: 232 lb (105.2 kg) BMI (Calculated): 38.61 Weight at Last Visit: 231 lb Weight Lost Since Last Visit: 0 Weight Gained Since Last Visit: 1 lb Starting Weight: 240 lb Total Weight Loss (lbs): 18 lb (8.165 kg) Peak Weight: 250 lb   Body Composition  Body Fat %: 47.6 % Fat Mass (lbs): 110.8 lbs Muscle Mass (lbs): 115.8 lbs Total Body Water (lbs): 82.8 lbs Visceral Fat Rating : 13   Other Clinical Data Fasting: No Labs: No Today's Visit #: 34 Starting Date: 06/28/18    Chief Complaint: OBESITY   Discussed the use of AI scribe software for clinical note transcription with the patient, who gave verbal consent to proceed.  History of Present Illness Erin Warren is a 48 year old female who presents for obesity treatment plan assessment and progress evaluation.  She is adhering to her category two eating plan 80% of the time and engages in yoga for 15 minutes twice a day, two days a week. She is attempting to increase her walking. Despite these efforts, she has gained one pound in the last month. She uses online grocery shopping to avoid temptations.  She has a history of hypertension and reports an elevated blood pressure reading today of 147/90, with a repeat reading of 139/94. She is currently taking chlorthalidone, propranolol, and losartan for blood pressure management and requires refills for these medications. She takes these medications at night to avoid interference with her levothyroxine.  She is on Prilosec for heartburn, which she is trying to manage through weight loss, and requests a refill. No issues with the medication.  She takes Wellbutrin XL 300 mg daily for emotional eating behavior and finds it helpful in managing her symptoms. She requests a refill for this medication.  Her sleep is suboptimal, averaging 5.5 to 6 hours  on weekdays and 8 to 9 hours on weekends. She occasionally takes 30 to 60 minute naps after work to regain energy. She acknowledges staying up late for personal time, which impacts her sleep duration.  She has recently started incorporating push-ups into her exercise routine, managing about ten before needing to rest for a few days due to soreness, particularly in her shoulders. She also experiences hand swelling during activities like walking, which she manages by shaking her hands.      PHYSICAL EXAM:  Blood pressure (!) 139/94, pulse 77, temperature 98 F (36.7 C), height 5\' 5"  (1.651 m), weight 232 lb (105.2 kg), SpO2 96%. Body mass index is 38.61 kg/m.  DIAGNOSTIC DATA REVIEWED:  BMET    Component Value Date/Time   NA 141 10/06/2023 1434   K 4.3 10/06/2023 1434   CL 102 10/06/2023 1434   CO2 21 10/06/2023 1434   GLUCOSE 99 10/06/2023 1434   GLUCOSE 80 02/10/2016 1735   BUN 18 10/06/2023 1434   CREATININE 0.83 10/06/2023 1434   CREATININE 0.67 02/10/2016 1735   CALCIUM 10.3 (H) 10/06/2023 1434   GFRNONAA 85 12/13/2019 1628   GFRAA 98 12/13/2019 1628   Lab Results  Component Value Date   HGBA1C 5.5 11/11/2022   HGBA1C 4.7 09/27/2013   Lab Results  Component Value Date   INSULIN 15.6 11/11/2022   INSULIN 14.2 06/28/2018   Lab Results  Component Value Date   TSH 3.330 11/11/2022   CBC    Component Value Date/Time   WBC 11.6 (H) 06/28/2018  1037   WBC 11.6 (H) 02/03/2018 1620   RBC 4.85 06/28/2018 1037   RBC 4.72 02/03/2018 1620   HGB 14.8 06/28/2018 1037   HCT 43.8 06/28/2018 1037   PLT 375.0 02/03/2018 1620   PLT 435 (H) 10/18/2017 1640   MCV 90 06/28/2018 1037   MCH 30.5 06/28/2018 1037   MCH 31.3 02/04/2015 1109   MCHC 33.8 06/28/2018 1037   MCHC 34.1 02/03/2018 1620   RDW 13.2 06/28/2018 1037   Iron Studies No results found for: "IRON", "TIBC", "FERRITIN", "IRONPCTSAT" Lipid Panel     Component Value Date/Time   CHOL 143 11/11/2022 1016   TRIG  115 11/11/2022 1016   HDL 53 11/11/2022 1016   CHOLHDL 3.1 10/18/2017 1640   CHOLHDL 2.7 02/04/2015 1109   VLDL 23 02/04/2015 1109   LDLCALC 69 11/11/2022 1016   Hepatic Function Panel     Component Value Date/Time   PROT 7.1 10/06/2023 1434   ALBUMIN 4.1 10/06/2023 1434   AST 20 10/06/2023 1434   ALT 22 10/06/2023 1434   ALKPHOS 113 10/06/2023 1434   BILITOT <0.2 10/06/2023 1434      Component Value Date/Time   TSH 3.330 11/11/2022 1016   Nutritional Lab Results  Component Value Date   VD25OH 85.0 11/11/2022   VD25OH 100.0 03/08/2022   VD25OH 49.2 12/25/2020     Assessment and Plan Assessment & Plan Obesity She follows a category two eating plan 80% of the time and engages in yoga and walking. Despite these efforts, she gained one pound in the last month. She is transitioning her menu to include lighter meals and more salads and is increasing physical activity with push-ups and wall push-ups. Discussed the importance of sleep in weight loss, noting inadequate sleep can lead to increased snacking and hinder weight loss efforts. - Continue category two eating plan - Encourage increased physical activity, including walking and push-ups - Transition to spring/summer menu with lighter meals and more salads - Aim for 7 hours of sleep per night to aid in weight loss  Emotional Eating She is on Wellbutrin XL 300 mg daily for emotional eating behavior, which has been very helpful. No issues with the medication. - Refill Wellbutrin XL  Hypertension Blood pressure readings today were elevated at 147/90 and 139/94. She is on chlorthalidone, propranolol, and losartan, all taken at night to avoid interference with levothyroxine. Elevated readings may be due to timing of medication administration. Discussed the importance of timing medications to avoid periods of inadequate coverage. Suggested taking losartan during the day to prevent overlap in medication effects and ensure consistent  blood pressure control. - Check blood pressure at home twice a week and record readings - Adjust medication timing: take losartan during the day and propranolol at night - Refill chlorthalidone, propranolol, and losartan  Gastroesophageal Reflux Disease (GERD) She is on Prilosec for heartburn and reports no issues with the medication. She is working on weight loss to help manage symptoms. - Refill Prilosec     I have personally spent 45 minutes total time today in preparation, patient care, and documentation for this visit, including the following: review of clinical lab tests; review of medical tests/procedures/services.    She was informed of the importance of frequent follow up visits to maximize her success with intensive lifestyle modifications for her multiple health conditions.    Quillian Quince, MD

## 2023-12-12 ENCOUNTER — Encounter: Payer: Self-pay | Admitting: Diagnostic Neuroimaging

## 2023-12-12 ENCOUNTER — Telehealth: Payer: 59 | Admitting: Diagnostic Neuroimaging

## 2023-12-12 DIAGNOSIS — I10 Essential (primary) hypertension: Secondary | ICD-10-CM | POA: Diagnosis not present

## 2023-12-12 DIAGNOSIS — G43109 Migraine with aura, not intractable, without status migrainosus: Secondary | ICD-10-CM | POA: Diagnosis not present

## 2023-12-12 MED ORDER — RIZATRIPTAN BENZOATE 10 MG PO TBDP: 10.0000 mg | ORAL_TABLET | ORAL | 12 refills | Status: AC | PRN

## 2023-12-12 MED ORDER — PROPRANOLOL HCL ER 120 MG PO CP24: 120.0000 mg | ORAL_CAPSULE | Freq: Every day | ORAL | 4 refills | Status: AC
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
## 2023-12-27 ENCOUNTER — Ambulatory Visit (INDEPENDENT_AMBULATORY_CARE_PROVIDER_SITE_OTHER): Payer: 59 | Admitting: Family Medicine

## 2023-12-27 ENCOUNTER — Encounter (INDEPENDENT_AMBULATORY_CARE_PROVIDER_SITE_OTHER): Payer: Self-pay | Admitting: Family Medicine

## 2024-01-03 ENCOUNTER — Other Ambulatory Visit (INDEPENDENT_AMBULATORY_CARE_PROVIDER_SITE_OTHER): Payer: Self-pay | Admitting: Family Medicine

## 2024-01-03 DIAGNOSIS — I10 Essential (primary) hypertension: Secondary | ICD-10-CM

## 2024-01-24 ENCOUNTER — Encounter (INDEPENDENT_AMBULATORY_CARE_PROVIDER_SITE_OTHER): Payer: Self-pay | Admitting: Family Medicine

## 2024-01-24 ENCOUNTER — Other Ambulatory Visit (INDEPENDENT_AMBULATORY_CARE_PROVIDER_SITE_OTHER): Payer: Self-pay | Admitting: Family Medicine

## 2024-01-24 ENCOUNTER — Ambulatory Visit (INDEPENDENT_AMBULATORY_CARE_PROVIDER_SITE_OTHER): Admitting: Family Medicine

## 2024-01-24 VITALS — BP 122/78 | HR 89 | Temp 98.3°F | Ht 65.0 in | Wt 236.0 lb

## 2024-01-24 DIAGNOSIS — K219 Gastro-esophageal reflux disease without esophagitis: Secondary | ICD-10-CM | POA: Diagnosis not present

## 2024-01-24 DIAGNOSIS — F3289 Other specified depressive episodes: Secondary | ICD-10-CM

## 2024-01-24 DIAGNOSIS — F5089 Other specified eating disorder: Secondary | ICD-10-CM

## 2024-01-24 DIAGNOSIS — I10 Essential (primary) hypertension: Secondary | ICD-10-CM | POA: Diagnosis not present

## 2024-01-24 DIAGNOSIS — E66812 Obesity, class 2: Secondary | ICD-10-CM

## 2024-01-24 DIAGNOSIS — Z6839 Body mass index (BMI) 39.0-39.9, adult: Secondary | ICD-10-CM

## 2024-01-24 DIAGNOSIS — E669 Obesity, unspecified: Secondary | ICD-10-CM | POA: Diagnosis not present

## 2024-01-24 MED ORDER — LOSARTAN POTASSIUM 50 MG PO TABS: 50.0000 mg | ORAL_TABLET | Freq: Every day | ORAL | 0 refills | Status: DC

## 2024-01-24 MED ORDER — BUPROPION HCL ER (XL) 300 MG PO TB24: 300.0000 mg | ORAL_TABLET | Freq: Every morning | ORAL | 0 refills | Status: DC

## 2024-01-24 MED ORDER — OMEPRAZOLE 40 MG PO CPDR: 40.0000 mg | DELAYED_RELEASE_CAPSULE | Freq: Every day | ORAL | 0 refills | Status: DC

## 2024-01-24 MED ORDER — CHLORTHALIDONE 25 MG PO TABS: 25.0000 mg | ORAL_TABLET | Freq: Every day | ORAL | 0 refills | Status: DC

## 2024-01-24 NOTE — Progress Notes (Signed)
 Office: [phone]  /  Fax: [phone]  WEIGHT SUMMARY AND BIOMETRICS  Anthropometric Measurements Height: 5\' 5"  (1.651 m) Weight: 236 lb (107 kg) BMI (Calculated): 39.27 Weight at Last Visit: 232 lb Weight Lost Since Last Visit: 0 Weight Gained Since Last Visit: 4 lb Starting Weight: 240 lb Total Weight Loss (lbs): 4 lb (1.814 kg) Peak Weight: 250 lb   Body Composition  Body Fat %: 46.4 % Fat Mass (lbs): 109.6 lbs Muscle Mass (lbs): 120 lbs Total Body Water (lbs): 82.8 lbs Visceral Fat Rating : 13   Other Clinical Data Fasting: no Labs: no Today's Visit #: 71 Starting Date: 06/28/18    Chief Complaint: OBESITY    History of Present Illness Erin Warren is a 48 year old female with obesity and hypertension who presents for obesity treatment and progress assessment.  She follows a category two eating plan about fifty percent of the time and engages in yoga and pushups for about fifteen minutes, five times per week. Despite these efforts, she has gained four pounds in the last two months. She faces challenges with maintaining her nutrition, particularly with protein intake, often resorting to microwave meals for lunch. She snacks on marinated cucumbers and experiences fluctuating hunger levels, with high stress days leading to decreased appetite.  She has a history of hypertension and is currently on chlorthalidone  25 mg and losartan  50 mg. Her initial blood pressure reading was elevated at 149/86, but a recheck showed improvement to 122/78. She prefers manual blood pressure readings for accuracy.  She has a history of GERD, for which she takes omeprazole . Her heartburn has not been too bad lately, and she requests a refill of her medication.  She has a history of emotional eating behaviors and is being treated with Wellbutrin  XL 300 mg daily, for which she also requests a refill. She describes feeling 'overloaded' and possibly experiencing burnout, impacting her  ability to cook and manage daily tasks. She has started using a therapy app to help with relaxation and sleep, noting that she needs to get to sleep earlier. She works from home two days a week and goes into the office three days a week.      PHYSICAL EXAM:  Blood pressure 122/78, pulse 89, temperature 98.3 F (36.8 C), height 5\' 5"  (1.651 m), weight 236 lb (107 kg), SpO2 97%. Body mass index is 39.27 kg/m.  DIAGNOSTIC DATA REVIEWED:  BMET    Component Value Date/Time   NA 141 10/06/2023 1434   K 4.3 10/06/2023 1434   CL 102 10/06/2023 1434   CO2 21 10/06/2023 1434   GLUCOSE 99 10/06/2023 1434   GLUCOSE 80 02/10/2016 1735   BUN 18 10/06/2023 1434   CREATININE 0.83 10/06/2023 1434   CREATININE 0.67 02/10/2016 1735   CALCIUM 10.3 (H) 10/06/2023 1434   GFRNONAA 85 12/13/2019 1628   GFRAA 98 12/13/2019 1628   Lab Results  Component Value Date   HGBA1C 5.5 11/11/2022   HGBA1C 4.7 09/27/2013   Lab Results  Component Value Date   INSULIN  15.6 11/11/2022   INSULIN  14.2 06/28/2018   Lab Results  Component Value Date   TSH 3.330 11/11/2022   CBC    Component Value Date/Time   WBC 11.6 (H) 06/28/2018 1037   WBC 11.6 (H) 02/03/2018 1620   RBC 4.85 06/28/2018 1037   RBC 4.72 02/03/2018 1620   HGB 14.8 06/28/2018 1037   HCT 43.8 06/28/2018 1037   PLT 375.0 02/03/2018 1620  PLT 435 (H) 10/18/2017 1640   MCV 90 06/28/2018 1037   MCH 30.5 06/28/2018 1037   MCH 31.3 02/04/2015 1109   MCHC 33.8 06/28/2018 1037   MCHC 34.1 02/03/2018 1620   RDW 13.2 06/28/2018 1037   Iron Studies No results found for: "IRON", "TIBC", "FERRITIN", "IRONPCTSAT" Lipid Panel     Component Value Date/Time   CHOL 143 11/11/2022 1016   TRIG 115 11/11/2022 1016   HDL 53 11/11/2022 1016   CHOLHDL 3.1 10/18/2017 1640   CHOLHDL 2.7 02/04/2015 1109   VLDL 23 02/04/2015 1109   LDLCALC 69 11/11/2022 1016   Hepatic Function Panel     Component Value Date/Time   PROT 7.1 10/06/2023 1434    ALBUMIN 4.1 10/06/2023 1434   AST 20 10/06/2023 1434   ALT 22 10/06/2023 1434   ALKPHOS 113 10/06/2023 1434   BILITOT <0.2 10/06/2023 1434      Component Value Date/Time   TSH 3.330 11/11/2022 1016   Nutritional Lab Results  Component Value Date   VD25OH 85.0 11/11/2022   VD25OH 100.0 03/08/2022   VD25OH 49.2 12/25/2020     Assessment and Plan Assessment & Plan Obesity Obesity management with a category two eating plan. She adheres to the plan approximately 50% of the time. Engages in yoga and pushups for 15 minutes, five times per week. Reports a weight gain of four pounds over the last two months. Challenges with protein intake and meal preparation due to stress and burnout were discussed. Encouraged to focus on easy protein sources and consider slow cooker recipes to simplify meal preparation. - Continue category two eating plan with increased adherence. - Maintain current exercise regimen. - Incorporate easy protein sources such as shrimp cocktail and side salads. - Utilize slow cooker recipes for simplified meal preparation.  Emotional eating behaviors Emotional eating behaviors managed with Wellbutrin  XL 300 mg daily. She feels overloaded and experiences stress, impacting eating habits. Engages in therapy app for relaxation and stress management. No issues with Wellbutrin  affecting sleep. - Continue Wellbutrin  XL 300 mg daily and refill prescription. - Continue using therapy app for relaxation and stress management.  Hypertension Hypertension managed with chlorthalidone  25 mg and losartan  50 mg. Initial blood pressure reading was elevated at 149/86 mmHg, but improved to 122/78 mmHg upon recheck. Discussed the importance of manual blood pressure measurements for accuracy. - Continue chlorthalidone  25 mg and losartan  50 mg. - Encourage manual blood pressure measurements for accuracy.  GERD GERD managed with omeprazole . Heartburn symptoms have not been problematic lately. -  Refill omeprazole  prescription.    She was informed of the importance of frequent follow up visits to maximize her success with intensive lifestyle modifications for her multiple health conditions.    Jasmine Mesi, MD

## 2024-04-02 ENCOUNTER — Ambulatory Visit (INDEPENDENT_AMBULATORY_CARE_PROVIDER_SITE_OTHER): Admitting: Family Medicine

## 2024-04-02 ENCOUNTER — Encounter (INDEPENDENT_AMBULATORY_CARE_PROVIDER_SITE_OTHER): Payer: Self-pay | Admitting: Family Medicine

## 2024-04-02 VITALS — BP 164/113 | HR 89 | Temp 98.1°F | Ht 65.0 in | Wt 242.0 lb

## 2024-04-02 DIAGNOSIS — G43909 Migraine, unspecified, not intractable, without status migrainosus: Secondary | ICD-10-CM | POA: Diagnosis not present

## 2024-04-02 DIAGNOSIS — K219 Gastro-esophageal reflux disease without esophagitis: Secondary | ICD-10-CM

## 2024-04-02 DIAGNOSIS — Z6841 Body Mass Index (BMI) 40.0 and over, adult: Secondary | ICD-10-CM

## 2024-04-02 DIAGNOSIS — F909 Attention-deficit hyperactivity disorder, unspecified type: Secondary | ICD-10-CM | POA: Diagnosis not present

## 2024-04-02 DIAGNOSIS — E66812 Obesity, class 2: Secondary | ICD-10-CM

## 2024-04-02 DIAGNOSIS — E669 Obesity, unspecified: Secondary | ICD-10-CM | POA: Diagnosis not present

## 2024-04-02 DIAGNOSIS — I10 Essential (primary) hypertension: Secondary | ICD-10-CM

## 2024-04-02 DIAGNOSIS — Z6839 Body mass index (BMI) 39.0-39.9, adult: Secondary | ICD-10-CM

## 2024-04-02 DIAGNOSIS — F3289 Other specified depressive episodes: Secondary | ICD-10-CM

## 2024-04-02 MED ORDER — LOSARTAN POTASSIUM 100 MG PO TABS: 100.0000 mg | ORAL_TABLET | Freq: Every day | ORAL | 0 refills | Status: DC

## 2024-04-02 MED ORDER — CHLORTHALIDONE 25 MG PO TABS: 25.0000 mg | ORAL_TABLET | Freq: Every day | ORAL | 0 refills | Status: DC

## 2024-04-02 MED ORDER — OMEPRAZOLE 40 MG PO CPDR
40.0000 mg | DELAYED_RELEASE_CAPSULE | Freq: Every day | ORAL | 0 refills | Status: DC
Start: 2024-04-02 — End: 2024-05-29

## 2024-04-02 MED ORDER — BUPROPION HCL ER (XL) 300 MG PO TB24: 300.0000 mg | ORAL_TABLET | Freq: Every morning | ORAL | 0 refills | Status: DC

## 2024-04-02 NOTE — Progress Notes (Signed)
 Office: [phone]  /  Fax: [phone]  WEIGHT SUMMARY AND BIOMETRICS  Anthropometric Measurements Height: 5' 5 (1.651 m) Weight: 242 lb (109.8 kg) BMI (Calculated): 40.27 Weight at Last Visit: 236 lb Weight Lost Since Last Visit: 0 Weight Gained Since Last Visit: 6 lb Starting Weight: 240 lb Total Weight Loss (lbs): 0 lb (0 kg) Peak Weight: 260 lb   Body Composition  Body Fat %: 49.2 % Fat Mass (lbs): 119.4 lbs Muscle Mass (lbs): 117 lbs Total Body Water (lbs): 88.4 lbs Visceral Fat Rating : 15   Other Clinical Data Fasting: no Labs: no Today's Visit #: 56 Starting Date: 06/28/18    Chief Complaint: OBESITY   History of Present Illness Erin Warren is a 48 year old female with obesity and hypertension who presents for obesity treatment and progress assessment.  She has been adhering to the category two eating plan only 60% of the time, with increased carbohydrate intake due to stress and lack of energy for meal preparation, resulting in a six-pound weight gain over the last two months. She engages in yoga and pushups for 15 minutes, five times a week.  Her blood pressure was recorded at 140/98 mmHg manually and 164/113 mmHg automatically. She is currently taking propranolol  for migraines, chlorthalidone , and losartan  50 mg for blood pressure management, with no missed doses reported. She has a history of elevated blood pressure and does not regularly check her blood pressure at home.  She experiences fatigue, which she attributes to stress and insufficient sleep, particularly low a week before her vacation. She also mentions migraine issues during periods of rain. She describes her fatigue as a common issue among autistic individuals, relating it to the 'spoon theory,' where she starts with fewer resources and requires more effort to recover. She has been taking on more household responsibilities due to her partner's limitations.  She is on bupropion  for ADHD  and reports no issues with it. She also takes omeprazole  for heartburn, which has been manageable with a few exceptions. She is trying to improve her nutrition by preparing meals in advance and experimenting with high-protein foods like chicken tenderloins and cottage cheese, while working on reducing her intake of orange juice and other simple carbohydrates.      PHYSICAL EXAM:  Blood pressure (!) 164/113, pulse 89, temperature 98.1 F (36.7 C), height 5' 5 (1.651 m), weight 242 lb (109.8 kg), SpO2 97%. Body mass index is 40.27 kg/m.  DIAGNOSTIC DATA REVIEWED:  BMET    Component Value Date/Time   NA 141 10/06/2023 1434   K 4.3 10/06/2023 1434   CL 102 10/06/2023 1434   CO2 21 10/06/2023 1434   GLUCOSE 99 10/06/2023 1434   GLUCOSE 80 02/10/2016 1735   BUN 18 10/06/2023 1434   CREATININE 0.83 10/06/2023 1434   CREATININE 0.67 02/10/2016 1735   CALCIUM 10.3 (H) 10/06/2023 1434   GFRNONAA 85 12/13/2019 1628   GFRAA 98 12/13/2019 1628   Lab Results  Component Value Date   HGBA1C 5.5 11/11/2022   HGBA1C 4.7 09/27/2013   Lab Results  Component Value Date   INSULIN  15.6 11/11/2022   INSULIN  14.2 06/28/2018   Lab Results  Component Value Date   TSH 3.330 11/11/2022   CBC    Component Value Date/Time   WBC 11.6 (H) 06/28/2018 1037   WBC 11.6 (H) 02/03/2018 1620   RBC 4.85 06/28/2018 1037   RBC 4.72 02/03/2018 1620   HGB 14.8 06/28/2018 1037   HCT  43.8 06/28/2018 1037   PLT 375.0 02/03/2018 1620   PLT 435 (H) 10/18/2017 1640   MCV 90 06/28/2018 1037   MCH 30.5 06/28/2018 1037   MCH 31.3 02/04/2015 1109   MCHC 33.8 06/28/2018 1037   MCHC 34.1 02/03/2018 1620   RDW 13.2 06/28/2018 1037   Iron Studies No results found for: IRON, TIBC, FERRITIN, IRONPCTSAT Lipid Panel     Component Value Date/Time   CHOL 143 11/11/2022 1016   TRIG 115 11/11/2022 1016   HDL 53 11/11/2022 1016   CHOLHDL 3.1 10/18/2017 1640   CHOLHDL 2.7 02/04/2015 1109   VLDL 23  02/04/2015 1109   LDLCALC 69 11/11/2022 1016   Hepatic Function Panel     Component Value Date/Time   PROT 7.1 10/06/2023 1434   ALBUMIN 4.1 10/06/2023 1434   AST 20 10/06/2023 1434   ALT 22 10/06/2023 1434   ALKPHOS 113 10/06/2023 1434   BILITOT <0.2 10/06/2023 1434      Component Value Date/Time   TSH 3.330 11/11/2022 1016   Nutritional Lab Results  Component Value Date   VD25OH 85.0 11/11/2022   VD25OH 100.0 03/08/2022   VD25OH 49.2 12/25/2020     Assessment and Plan Assessment & Plan Hypertension Blood pressure is uncontrolled with readings of 140/98 mmHg manually and 164/113 mmHg automatically. She is on propranolol  for migraines, chlorthalidone , and losartan  50 mg for blood pressure management. The current regimen is insufficient, with no indication of medication non-adherence. Stress and dietary factors may contribute to increased blood pressure. Increasing the losartan  dose is considered the easiest adjustment to improve control. - Increase losartan  dose from 50 mg to 100 mg daily. - Continue propranolol  and chlorthalidone  as prescribed. - Encourage home blood pressure monitoring. - Reinforce the importance of hydration.  Obesity She adheres to the prescribed category two eating plan only 60% of the time and has gained six pounds in the last two months, with a significant portion being visceral fat. Increased carbohydrate intake is attributed to stress and lack of energy for meal preparation. She engages in physical activity, including yoga and pushups, but dietary factors are predominant in weight gain. The hierarchy of health prioritizes nutrition, sleep, and exercise in that order. - Continue category two eating plan. - Incorporate convenient and healthier meal options such as frozen meals with added protein. - Encourage meal prepping, such as cooking chicken tenderloins in advance. - Emphasize the importance of nutrition, sleep, and exercise in that order for  overall health.  Migraine She experiences migraines exacerbated by weather changes such as rain. Propranolol  is used for migraine prophylaxis and provides some benefit for blood pressure control. - Continue propranolol  for migraine prophylaxis.  ADHD She is on Wellbutrin  for her symptoms. Good control of her ADHD can help with meal planning which will make weight loss efforts more successful - Refill Wellbutrin        She was informed of the importance of frequent follow up visits to maximize her success with intensive lifestyle modifications for her multiple health conditions.    Louann Penton, MD

## 2024-04-30 ENCOUNTER — Ambulatory Visit (INDEPENDENT_AMBULATORY_CARE_PROVIDER_SITE_OTHER): Admitting: Family Medicine

## 2024-04-30 ENCOUNTER — Encounter (INDEPENDENT_AMBULATORY_CARE_PROVIDER_SITE_OTHER): Payer: Self-pay | Admitting: Family Medicine

## 2024-04-30 VITALS — BP 134/79 | HR 89 | Temp 98.5°F | Ht 65.0 in | Wt 240.0 lb

## 2024-04-30 DIAGNOSIS — I10 Essential (primary) hypertension: Secondary | ICD-10-CM | POA: Diagnosis not present

## 2024-04-30 DIAGNOSIS — Z6839 Body mass index (BMI) 39.0-39.9, adult: Secondary | ICD-10-CM | POA: Diagnosis not present

## 2024-04-30 DIAGNOSIS — E669 Obesity, unspecified: Secondary | ICD-10-CM

## 2024-04-30 DIAGNOSIS — R7303 Prediabetes: Secondary | ICD-10-CM | POA: Diagnosis not present

## 2024-04-30 DIAGNOSIS — E66812 Obesity, class 2: Secondary | ICD-10-CM

## 2024-04-30 NOTE — Progress Notes (Signed)
 Office: [phone]  /  Fax: [phone]  WEIGHT SUMMARY AND BIOMETRICS  Anthropometric Measurements Height: 5' 5 (1.651 m) Weight: 240 lb (108.9 kg) BMI (Calculated): 39.94 Weight at Last Visit: 242 lb Weight Lost Since Last Visit: 2 Weight Gained Since Last Visit: 0 Starting Weight: 240 lb Total Weight Loss (lbs): 0 lb (0 kg)   Body Composition  Body Fat %: 46.8 % Fat Mass (lbs): 112.4 lbs Muscle Mass (lbs): 121.4 lbs Total Body Water (lbs): 84.8 lbs Visceral Fat Rating : 14   Other Clinical Data Fasting: no Labs: no Today's Visit #: 84 Starting Date: 06/28/18    Chief Complaint: OBESITY   History of Present Illness Erin Warren is a 48 year old female with hypertension, prediabetes, and obesity who presents for a follow-up visit.  She has experienced a recent change in her work position, initially causing stress and confusion, but she feels it is now calming down. To manage stress, she limits exposure to news and social media and focuses on enjoyable activities.  There is a slight improvement in her sleep, with an additional half hour to an hour of sleep per night. However, she still experiences disrupted sleep on weekends due to her cats.  She is actively working on her diet, cutting out most excess sugar and substituting with fresh fruit. She finds this change manageable and is conscious of her protein intake, feeling she is managing it well.  Her exercise routine remains unchanged, and she is contemplating how to incorporate more activity into her schedule. She desires to use her stepper machine more but is concerned about its placement in her home. A previous negative experience while walking alone has impacted her willingness to walk outside.  She is currently taking losartan  and metformin . She reports no issues with losartan  aside from increased urination, which she attributes to the medication's effectiveness. She has been on metformin  for approximately  twenty years and tolerates it well.      PHYSICAL EXAM:  Blood pressure 134/79, pulse 89, temperature 98.5 F (36.9 C), height 5' 5 (1.651 m), weight 240 lb (108.9 kg), SpO2 98%. Body mass index is 39.94 kg/m.  DIAGNOSTIC DATA REVIEWED:  BMET    Component Value Date/Time   NA 141 10/06/2023 1434   K 4.3 10/06/2023 1434   CL 102 10/06/2023 1434   CO2 21 10/06/2023 1434   GLUCOSE 99 10/06/2023 1434   GLUCOSE 80 02/10/2016 1735   BUN 18 10/06/2023 1434   CREATININE 0.83 10/06/2023 1434   CREATININE 0.67 02/10/2016 1735   CALCIUM 10.3 (H) 10/06/2023 1434   GFRNONAA 85 12/13/2019 1628   GFRAA 98 12/13/2019 1628   Lab Results  Component Value Date   HGBA1C 5.5 11/11/2022   HGBA1C 4.7 09/27/2013   Lab Results  Component Value Date   INSULIN  15.6 11/11/2022   INSULIN  14.2 06/28/2018   Lab Results  Component Value Date   TSH 3.330 11/11/2022   CBC    Component Value Date/Time   WBC 11.6 (H) 06/28/2018 1037   WBC 11.6 (H) 02/03/2018 1620   RBC 4.85 06/28/2018 1037   RBC 4.72 02/03/2018 1620   HGB 14.8 06/28/2018 1037   HCT 43.8 06/28/2018 1037   PLT 375.0 02/03/2018 1620   PLT 435 (H) 10/18/2017 1640   MCV 90 06/28/2018 1037   MCH 30.5 06/28/2018 1037   MCH 31.3 02/04/2015 1109   MCHC 33.8 06/28/2018 1037   MCHC 34.1 02/03/2018 1620   RDW 13.2 06/28/2018  1037   Iron Studies No results found for: IRON, TIBC, FERRITIN, IRONPCTSAT Lipid Panel     Component Value Date/Time   CHOL 143 11/11/2022 1016   TRIG 115 11/11/2022 1016   HDL 53 11/11/2022 1016   CHOLHDL 3.1 10/18/2017 1640   CHOLHDL 2.7 02/04/2015 1109   VLDL 23 02/04/2015 1109   LDLCALC 69 11/11/2022 1016   Hepatic Function Panel     Component Value Date/Time   PROT 7.1 10/06/2023 1434   ALBUMIN 4.1 10/06/2023 1434   AST 20 10/06/2023 1434   ALT 22 10/06/2023 1434   ALKPHOS 113 10/06/2023 1434   BILITOT <0.2 10/06/2023 1434      Component Value Date/Time   TSH 3.330 11/11/2022  1016   Nutritional Lab Results  Component Value Date   VD25OH 85.0 11/11/2022   VD25OH 100.0 03/08/2022   VD25OH 49.2 12/25/2020     Assessment and Plan Assessment & Plan Obesity Obesity management is ongoing with a successful weight loss of two pounds since the last visit, attributed to reduced sugar intake and increased fresh fruit consumption. She maintains protein intake and understands the importance of meal planning and preparation. Exercise routine remains unchanged, but she is considering using a stepper machine at home. She acknowledges the challenge of finding exercise enjoyable and safe. - Continue current dietary modifications, focusing on reducing sugar intake and maintaining protein consumption. - Encourage regular physical activity, including the use of a stepper machine at home.  Hypertension Hypertension is well-managed with losartan . Blood pressure is 134/79 mmHg, within acceptable range. She reports increased urination, indicating medication efficacy. No other medication issues reported. - Refill losartan  prescription. - Continue diet, exercise and weight loss as discussed today as an important part of the treatment plan  Prediabetes Prediabetes is managed with metformin , which she has been taking for approximately twenty years without issues. She tolerates the medication well and understands the importance of dietary management in controlling blood sugar levels. - Continue metformin  as prescribed. - Continue diet, exercise and weight loss as discussed today as an important part of the treatment plan       She was informed of the importance of frequent follow up visits to maximize her success with intensive lifestyle modifications for her multiple health conditions.    Louann Penton, MD

## 2024-05-29 ENCOUNTER — Encounter (INDEPENDENT_AMBULATORY_CARE_PROVIDER_SITE_OTHER): Payer: Self-pay | Admitting: Family Medicine

## 2024-05-29 ENCOUNTER — Ambulatory Visit (INDEPENDENT_AMBULATORY_CARE_PROVIDER_SITE_OTHER): Admitting: Family Medicine

## 2024-05-29 VITALS — BP 132/78 | HR 94 | Temp 98.0°F | Ht 65.0 in | Wt 246.0 lb

## 2024-05-29 DIAGNOSIS — Z6839 Body mass index (BMI) 39.0-39.9, adult: Secondary | ICD-10-CM

## 2024-05-29 DIAGNOSIS — F5089 Other specified eating disorder: Secondary | ICD-10-CM | POA: Diagnosis not present

## 2024-05-29 DIAGNOSIS — F3289 Other specified depressive episodes: Secondary | ICD-10-CM

## 2024-05-29 DIAGNOSIS — J309 Allergic rhinitis, unspecified: Secondary | ICD-10-CM

## 2024-05-29 DIAGNOSIS — F32A Depression, unspecified: Secondary | ICD-10-CM

## 2024-05-29 DIAGNOSIS — E66812 Obesity, class 2: Secondary | ICD-10-CM

## 2024-05-29 DIAGNOSIS — I10 Essential (primary) hypertension: Secondary | ICD-10-CM

## 2024-05-29 DIAGNOSIS — K219 Gastro-esophageal reflux disease without esophagitis: Secondary | ICD-10-CM

## 2024-05-29 DIAGNOSIS — J302 Other seasonal allergic rhinitis: Secondary | ICD-10-CM

## 2024-05-29 MED ORDER — OMEPRAZOLE 40 MG PO CPDR: 40.0000 mg | DELAYED_RELEASE_CAPSULE | Freq: Every day | ORAL | 0 refills | Status: DC

## 2024-05-29 MED ORDER — BUPROPION HCL ER (XL) 300 MG PO TB24: 300.0000 mg | ORAL_TABLET | Freq: Every morning | ORAL | 0 refills | Status: DC

## 2024-05-29 MED ORDER — CHLORTHALIDONE 25 MG PO TABS: 25.0000 mg | ORAL_TABLET | Freq: Every day | ORAL | 0 refills | Status: DC

## 2024-05-29 MED ORDER — LOSARTAN POTASSIUM 100 MG PO TABS: 100.0000 mg | ORAL_TABLET | Freq: Every day | ORAL | 0 refills | Status: DC

## 2024-05-29 NOTE — Progress Notes (Signed)
 Office: [phone]  /  Fax: [phone]  WEIGHT SUMMARY AND BIOMETRICS  Anthropometric Measurements Height: 5' 5 (1.651 m) Weight: 246 lb (111.6 kg) BMI (Calculated): 40.94 Weight at Last Visit: 240 lb Weight Lost Since Last Visit: 0 Weight Gained Since Last Visit: 6 lb Starting Weight: 240 lb Total Weight Loss (lbs): 0 lb (0 kg) Peak Weight: 260 lb   Body Composition  Body Fat %: 49.7 % Fat Mass (lbs): 122.4 lbs Muscle Mass (lbs): 117.6 lbs Total Body Water (lbs): 89.2 lbs Visceral Fat Rating : 15   Other Clinical Data Fasting: no Labs: no Today's Visit #: 80 Starting Date: 06/28/18    Chief Complaint: OBESITY   History of Present Illness Erin Warren is a 48 year old female with obesity and hypertension who presents for obesity treatment and progress assessment.  She adheres to the category two eating plan approximately 80% of the time, focusing on increased consumption of fruits, vegetables, and protein. She struggles with maintaining adequate hydration and sleep, often achieving less than seven to nine hours per night. Her physical activity includes yoga and wall pushups for about twenty minutes, five to six days a week. Despite these efforts, she has gained six pounds in the last month. Emotional eating behaviors persist, and she is on bupropion  XL 300 mg daily.  In addition to obesity, she is managing hypertension. Her initial blood pressure reading was 141/86, which improved to 132/78 upon repeat measurement. She is currently taking chlorthalidone  25 mg and losartan  100 mg. She feels she is retaining a lot of fluid lately, which she attributes to high sodium intake and possibly increased carbohydrate consumption.  She has a history of GERD, which is stable on omeprazole  40 mg daily. She also experiences fall allergies, which have flared up. She uses Claritin, a nasal spray from her allergist, and Afrin as needed. She mentions needing a refill for Flonase  and  has recently started using a nasal spreader to help with nasal passage issues.  She discusses challenges with meal prepping due to a busy schedule, which sometimes leads to eating later in the evening. Her workplace encourages micro exercises, and she is interested in incorporating more non-exercise activity thermogenesis (NEAT) into her routine. No significant issues with hunger are reported, but she notes some boredom and stress-related eating.      PHYSICAL EXAM:  Blood pressure 132/78, pulse 94, temperature 98 F (36.7 C), height 5' 5 (1.651 m), weight 246 lb (111.6 kg), SpO2 97%. Body mass index is 40.94 kg/m.  DIAGNOSTIC DATA REVIEWED:  BMET    Component Value Date/Time   NA 141 10/06/2023 1434   K 4.3 10/06/2023 1434   CL 102 10/06/2023 1434   CO2 21 10/06/2023 1434   GLUCOSE 99 10/06/2023 1434   GLUCOSE 80 02/10/2016 1735   BUN 18 10/06/2023 1434   CREATININE 0.83 10/06/2023 1434   CREATININE 0.67 02/10/2016 1735   CALCIUM 10.3 (H) 10/06/2023 1434   GFRNONAA 85 12/13/2019 1628   GFRAA 98 12/13/2019 1628   Lab Results  Component Value Date   HGBA1C 5.5 11/11/2022   HGBA1C 4.7 09/27/2013   Lab Results  Component Value Date   INSULIN  15.6 11/11/2022   INSULIN  14.2 06/28/2018   Lab Results  Component Value Date   TSH 3.330 11/11/2022   CBC    Component Value Date/Time   WBC 11.6 (H) 06/28/2018 1037   WBC 11.6 (H) 02/03/2018 1620   RBC 4.85 06/28/2018 1037   RBC  4.72 02/03/2018 1620   HGB 14.8 06/28/2018 1037   HCT 43.8 06/28/2018 1037   PLT 375.0 02/03/2018 1620   PLT 435 (H) 10/18/2017 1640   MCV 90 06/28/2018 1037   MCH 30.5 06/28/2018 1037   MCH 31.3 02/04/2015 1109   MCHC 33.8 06/28/2018 1037   MCHC 34.1 02/03/2018 1620   RDW 13.2 06/28/2018 1037   Iron Studies No results found for: IRON, TIBC, FERRITIN, IRONPCTSAT Lipid Panel     Component Value Date/Time   CHOL 143 11/11/2022 1016   TRIG 115 11/11/2022 1016   HDL 53 11/11/2022  1016   CHOLHDL 3.1 10/18/2017 1640   CHOLHDL 2.7 02/04/2015 1109   VLDL 23 02/04/2015 1109   LDLCALC 69 11/11/2022 1016   Hepatic Function Panel     Component Value Date/Time   PROT 7.1 10/06/2023 1434   ALBUMIN 4.1 10/06/2023 1434   AST 20 10/06/2023 1434   ALT 22 10/06/2023 1434   ALKPHOS 113 10/06/2023 1434   BILITOT <0.2 10/06/2023 1434      Component Value Date/Time   TSH 3.330 11/11/2022 1016   Nutritional Lab Results  Component Value Date   VD25OH 85.0 11/11/2022   VD25OH 100.0 03/08/2022   VD25OH 49.2 12/25/2020     Assessment and Plan Assessment & Plan Obesity, class 2 with associated behavioral and dietary challenges She has gained six pounds in the last month, with four pounds attributed to fluid retention and two pounds to actual weight gain. She follows the category two eating plan about 80% of the time, working on increasing fruit, vegetable, and protein intake, but struggles with hydration and sleep. Emotional eating behaviors are present, and she engages in yoga and wall pushups for exercise. Discussed the impact of sodium and carbohydrates on fluid retention and weight gain. - Provide slow cooker recipes to aid in meal preparation. - Encourage adherence to category two eating plan. - Advise on increasing hydration and improving sleep. - Encourage NEAT activities to increase daily calorie expenditure.  Essential hypertension Initially elevated at 141/86 but improved to 132/78 on repeat measurement. She is currently on chlorthalidone  25 mg and losartan  100 mg. Discussed the potential impact of stress and fluid retention on blood pressure readings. - Refill chlorthalidone  25 mg. - Refill losartan  100 mg. - Continue diet, exercise and weight loss as discussed today as an important part of the treatment plan   Gastroesophageal reflux disease (GERD) without esophagitis GERD is well-controlled on omeprazole  40 mg daily. She reports well-controlled heartburn. -  Refill omeprazole  40 mg daily. - Continue diet, exercise and weight loss as discussed today as an important part of the treatment plan   Allergic rhinitis Current flare-up. She uses Claritin, a nasal spray from her allergist, and Afrin as needed. She has not yet refilled her Flonase . Discussed the importance of using Flonase  twice daily for a few days to reduce nasal inflammation and improve breathing. - Advise to refill and use Flonase  twice daily for a few days.  Depression with emotional eating behaviors She is on bupropion  XL 300 mg daily. Discussed the impact of stress and emotional factors on eating behaviors and weight management. - Refill bupropion  XL 300 mg daily. - Continue to monitor closely     Deirdre was informed of the importance of frequent follow up visits to maximize her success with intensive lifestyle modifications for her obesity and obesity related health conditions as recommended by USPSTF and CMS guidelines   Louann Penton, MD

## 2024-06-26 ENCOUNTER — Ambulatory Visit (INDEPENDENT_AMBULATORY_CARE_PROVIDER_SITE_OTHER): Admitting: Family Medicine

## 2024-06-26 ENCOUNTER — Encounter (INDEPENDENT_AMBULATORY_CARE_PROVIDER_SITE_OTHER): Payer: Self-pay | Admitting: Family Medicine

## 2024-06-26 VITALS — BP 142/100 | HR 84 | Temp 97.9°F | Ht 65.0 in | Wt 245.0 lb

## 2024-06-26 DIAGNOSIS — Z6841 Body Mass Index (BMI) 40.0 and over, adult: Secondary | ICD-10-CM

## 2024-06-26 DIAGNOSIS — E669 Obesity, unspecified: Secondary | ICD-10-CM

## 2024-06-26 DIAGNOSIS — I10 Essential (primary) hypertension: Secondary | ICD-10-CM

## 2024-06-26 NOTE — Progress Notes (Signed)
 Office: [phone]  /  Fax: [phone]  WEIGHT SUMMARY AND BIOMETRICS  Anthropometric Measurements Height: 5' 5 (1.651 m) Weight: 245 lb (111.1 kg) BMI (Calculated): 40.77 Weight at Last Visit: 246 lb Weight Lost Since Last Visit: 1 lb Weight Gained Since Last Visit: 0 Starting Weight: 240 lb Total Weight Loss (lbs): 0 lb (0 kg) Peak Weight: 260 lb   Body Composition  Body Fat %: 49.1 % Fat Mass (lbs): 120.2 lbs Muscle Mass (lbs): 118.4 lbs Total Body Water (lbs): 88.2 lbs Visceral Fat Rating : 15   Other Clinical Data Fasting: no Labs: no Today's Visit #: 8 Starting Date: 06/28/18    Chief Complaint: OBESITY   Discussed the use of AI scribe software for clinical note transcription with the patient, who gave verbal consent to proceed.  History of Present Illness Erin Warren is a 48 year old female who presents for obesity treatment and progress assessment.  She has been following the category two eating plan approximately 80% of the time, focusing on increasing her intake of fruits and vegetables, meeting protein goals, and maintaining adequate hydration. She ensures not to skip meals. She has incorporated yoga into her routine, practicing for 15 minutes five days a week, and has recently lost one pound.  Her blood pressure was initially elevated at 142/100 mmHg but improved to 139/89 mmHg upon repeat measurement. She is currently taking chlorthalidone , losartan , and propranolol  for blood pressure management. She possesses a home blood pressure monitor but has not been using it regularly.  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 She acknowledges some stress eating, particularly when her partner eats, but has managed to control it by choosing specific treats like sour pickles and Haribo gummies. She is experimenting with dance exercise to improve posture and  has recently started this activity.      PHYSICAL EXAM:  Blood pressure (!) 142/100, pulse 84, temperature 97.9 F (36.6 C), height 5' 5 (1.651 m), weight 245 lb (111.1 kg), SpO2 97%. Body mass index is 40.77 kg/m.  DIAGNOSTIC DATA REVIEWED:  BMET    Component Value Date/Time   NA 141 10/06/2023 1434   K 4.3 10/06/2023 1434   CL 102 10/06/2023 1434   CO2 21 10/06/2023 1434   GLUCOSE 99 10/06/2023 1434   GLUCOSE 80 02/10/2016 1735   BUN 18 10/06/2023 1434   CREATININE 0.83 10/06/2023 1434   CREATININE 0.67 02/10/2016 1735   CALCIUM 10.3 (H) 10/06/2023 1434   GFRNONAA 85 12/13/2019 1628   GFRAA 98 12/13/2019 1628   Lab Results  Component Value Date   HGBA1C 5.5 11/11/2022   HGBA1C 4.7 09/27/2013   Lab Results  Component Value Date   INSULIN  15.6 11/11/2022   INSULIN  14.2 06/28/2018   Lab Results  Component Value Date   TSH 3.330 11/11/2022   CBC    Component Value Date/Time   WBC 11.6 (H) 06/28/2018 1037   WBC 11.6 (H) 02/03/2018 1620   RBC 4.85 06/28/2018 1037   RBC 4.72 02/03/2018 1620   HGB 14.8 06/28/2018 1037   HCT 43.8 06/28/2018 1037   PLT 375.0 02/03/2018 1620   PLT 435 (H) 10/18/2017 1640   MCV 90 06/28/2018 1037   MCH 30.5 06/28/2018 1037   MCH 31.3 02/04/2015 1109   MCHC 33.8 06/28/2018 1037   MCHC 34.1 02/03/2018 1620   RDW 13.2 06/28/2018 1037   Iron Studies No  results found for: IRON, TIBC, FERRITIN, IRONPCTSAT Lipid Panel     Component Value Date/Time   CHOL 143 11/11/2022 1016   TRIG 115 11/11/2022 1016   HDL 53 11/11/2022 1016   CHOLHDL 3.1 10/18/2017 1640   CHOLHDL 2.7 02/04/2015 1109   VLDL 23 02/04/2015 1109   LDLCALC 69 11/11/2022 1016   Hepatic Function Panel     Component Value Date/Time   PROT 7.1 10/06/2023 1434   ALBUMIN 4.1 10/06/2023 1434   AST 20 10/06/2023 1434   ALT 22 10/06/2023 1434   ALKPHOS 113 10/06/2023 1434   BILITOT <0.2 10/06/2023 1434      Component Value Date/Time   TSH 3.330  11/11/2022 1016   Nutritional Lab Results  Component Value Date   VD25OH 85.0 11/11/2022   VD25OH 100.0 03/08/2022   VD25OH 49.2 12/25/2020     Assessment and Plan Assessment & Plan Obesity with BMI 40.0-44.9, adult Obesity management is ongoing with adherence to the category two eating plan 80% of the time. She is incorporating more fruits, vegetables, and protein while maintaining hydration. Engages in yoga for 15 minutes, five days a week, and has lost one pound recently. Stress from work may contribute to occasional stress eating, but she has strategies to manage it, such as choosing healthier snacks like sour pickles over candy. Plans to try dance exercise to improve posture. - Continue category two eating plan. - Encouraged increased intake of fruits, vegetables, and protein. - Continue yoga exercises. - Encouraged trying dance exercise for posture improvement. - Monitor weight and dietary habits.  HTN with Elevated blood pressure Blood pressure was initially elevated at 142/100 mmHg but improved to 139/89 mmHg upon repeat measurement. She is on chlorthalidone , losartan , and propranolol  for blood pressure management. Stress from work may contribute to temporary elevations. Home blood pressure monitoring is recommended to assess trends and ensure readings remain within target range. - Continue current antihypertensive medications: chlorthalidone , losartan , and propranolol . - Encouraged home blood pressure monitoring to assess trends. - Advised to maintain blood pressure readings with systolic under 140 mmHg and diastolic under 90 mmHg.     I personally spent a total of 25 minutes in the care of the patient today including preparing to see the patient, documenting clinical information in the EMR, and customized nutritional counseling for their specific health and social needs.    [name] was informed of the importance of frequent follow up visits to maximize her success with  intensive lifestyle modifications for her obesity and obesity related health conditions as recommended by USPSTF and CMS guidelines   Louann Penton, MD

## 2024-07-25 ENCOUNTER — Ambulatory Visit (INDEPENDENT_AMBULATORY_CARE_PROVIDER_SITE_OTHER): Payer: Self-pay | Admitting: Family Medicine

## 2024-07-25 ENCOUNTER — Encounter (INDEPENDENT_AMBULATORY_CARE_PROVIDER_SITE_OTHER): Payer: Self-pay | Admitting: Family Medicine

## 2024-07-25 VITALS — BP 155/109 | HR 101 | Temp 98.2°F | Ht 65.0 in | Wt 247.0 lb

## 2024-07-25 DIAGNOSIS — I10 Essential (primary) hypertension: Secondary | ICD-10-CM | POA: Diagnosis not present

## 2024-07-25 DIAGNOSIS — F5089 Other specified eating disorder: Secondary | ICD-10-CM | POA: Diagnosis not present

## 2024-07-25 DIAGNOSIS — E669 Obesity, unspecified: Secondary | ICD-10-CM

## 2024-07-25 DIAGNOSIS — E559 Vitamin D deficiency, unspecified: Secondary | ICD-10-CM

## 2024-07-25 DIAGNOSIS — K219 Gastro-esophageal reflux disease without esophagitis: Secondary | ICD-10-CM

## 2024-07-25 DIAGNOSIS — F3289 Other specified depressive episodes: Secondary | ICD-10-CM

## 2024-07-25 DIAGNOSIS — R0683 Snoring: Secondary | ICD-10-CM

## 2024-07-25 DIAGNOSIS — Z87898 Personal history of other specified conditions: Secondary | ICD-10-CM

## 2024-07-25 DIAGNOSIS — Z6841 Body Mass Index (BMI) 40.0 and over, adult: Secondary | ICD-10-CM

## 2024-07-25 DIAGNOSIS — R7303 Prediabetes: Secondary | ICD-10-CM

## 2024-07-25 MED ORDER — OMEPRAZOLE 40 MG PO CPDR: 40.0000 mg | DELAYED_RELEASE_CAPSULE | Freq: Every day | ORAL | 0 refills | Status: DC

## 2024-07-25 MED ORDER — BUPROPION HCL ER (XL) 300 MG PO TB24: 300.0000 mg | ORAL_TABLET | Freq: Every morning | ORAL | 0 refills | Status: AC

## 2024-07-25 MED ORDER — LOSARTAN POTASSIUM 100 MG PO TABS: 100.0000 mg | ORAL_TABLET | Freq: Every day | ORAL | 0 refills | Status: DC

## 2024-07-25 MED ORDER — CHLORTHALIDONE 25 MG PO TABS: 25.0000 mg | ORAL_TABLET | Freq: Every day | ORAL | 0 refills | Status: DC

## 2024-07-25 NOTE — Progress Notes (Signed)
 Office: [phone]  /  Fax: [phone]  WEIGHT SUMMARY AND BIOMETRICS  Anthropometric Measurements Height: 5' 5 (1.651 m) Weight: 247 lb (112 kg) BMI (Calculated): 41.1 Weight at Last Visit: 245 lb Weight Lost Since Last Visit: 0 Weight Gained Since Last Visit: 2 lb Starting Weight: 240 lb Total Weight Loss (lbs): 0 lb (0 kg) Peak Weight: 260 lb   Body Composition  Body Fat %: 49.9 % Fat Mass (lbs): 123.4 lbs Muscle Mass (lbs): 117.6 lbs Total Body Water (lbs): 89.6 lbs Visceral Fat Rating : 15   Other Clinical Data Fasting: no Labs: no Today's Visit #: 77 Starting Date: 06/28/18    Chief Complaint: OBESITY    History of Present Illness Erin Warren is a 48 year old female with obesity and hypertension who presents for obesity treatment and progress assessment.  She has been following a category two eating plan approximately 75% of the time and engages in physical activity, including yoga and dance classes, for 20 minutes three times a week. Despite these efforts, she has gained two pounds in the last month and about 20 pounds over the past year. She experiences challenges with hunger levels and is working on strategies to manage her diet, especially with the upcoming holidays.  She is currently taking losartan  100 mg daily, propranolol  120 mg daily, and chlorthalidone  25 mg daily for hypertension. Her blood pressure readings today were elevated at 159/95 mmHg and 155/109 mmHg. She experiences stress and burnout, which she believes may be affecting her sleep and blood pressure. She is attempting to improve her sleep by going to bed earlier.  She is being treated for gastroesophageal reflux disease (GERD) and is taking Prilosec 40 mg daily.  For emotional eating behaviors, she is on Wellbutrin  XL 300 mg per day and requests a refill. Her emotional state and stress levels are contributing factors to her eating habits.  A past sleep study conducted over a decade  ago was inconclusive. She reports snoring and feeling 'a little bit draggy' upon waking, though she does not usually wake up with headaches. She uses sinus spreaders to help with nighttime sinus swelling, which has been beneficial. She typically gets up once per night to urinate, though this can vary. Her father had sleep apnea.      PHYSICAL EXAM:  Blood pressure (!) 155/109, pulse (!) 101, temperature 98.2 F (36.8 C), height 5' 5 (1.651 m), weight 247 lb (112 kg), SpO2 98%. Body mass index is 41.1 kg/m.  DIAGNOSTIC DATA REVIEWED:  BMET    Component Value Date/Time   NA 141 10/06/2023 1434   K 4.3 10/06/2023 1434   CL 102 10/06/2023 1434   CO2 21 10/06/2023 1434   GLUCOSE 99 10/06/2023 1434   GLUCOSE 80 02/10/2016 1735   BUN 18 10/06/2023 1434   CREATININE 0.83 10/06/2023 1434   CREATININE 0.67 02/10/2016 1735   CALCIUM 10.3 (H) 10/06/2023 1434   GFRNONAA 85 12/13/2019 1628   GFRAA 98 12/13/2019 1628   Lab Results  Component Value Date   HGBA1C 5.5 11/11/2022   HGBA1C 4.7 09/27/2013   Lab Results  Component Value Date   INSULIN  15.6 11/11/2022   INSULIN  14.2 06/28/2018   Lab Results  Component Value Date   TSH 3.330 11/11/2022   CBC    Component Value Date/Time   WBC 11.6 (H) 06/28/2018 1037   WBC 11.6 (H) 02/03/2018 1620   RBC 4.85 06/28/2018 1037   RBC 4.72 02/03/2018 1620  HGB 14.8 06/28/2018 1037   HCT 43.8 06/28/2018 1037   PLT 375.0 02/03/2018 1620   PLT 435 (H) 10/18/2017 1640   MCV 90 06/28/2018 1037   MCH 30.5 06/28/2018 1037   MCH 31.3 02/04/2015 1109   MCHC 33.8 06/28/2018 1037   MCHC 34.1 02/03/2018 1620   RDW 13.2 06/28/2018 1037   Iron Studies No results found for: IRON, TIBC, FERRITIN, IRONPCTSAT Lipid Panel     Component Value Date/Time   CHOL 143 11/11/2022 1016   TRIG 115 11/11/2022 1016   HDL 53 11/11/2022 1016   CHOLHDL 3.1 10/18/2017 1640   CHOLHDL 2.7 02/04/2015 1109   VLDL 23 02/04/2015 1109   LDLCALC 69  11/11/2022 1016   Hepatic Function Panel     Component Value Date/Time   PROT 7.1 10/06/2023 1434   ALBUMIN 4.1 10/06/2023 1434   AST 20 10/06/2023 1434   ALT 22 10/06/2023 1434   ALKPHOS 113 10/06/2023 1434   BILITOT <0.2 10/06/2023 1434      Component Value Date/Time   TSH 3.330 11/11/2022 1016   Nutritional Lab Results  Component Value Date   VD25OH 85.0 11/11/2022   VD25OH 100.0 03/08/2022   VD25OH 49.2 12/25/2020     Assessment and Plan Assessment & Plan Obesity with associated emotional eating behaviors Obesity with a weight gain of 2 pounds in the last month. Emotional eating behaviors are managed with Wellbutrin  XL 300 mg daily. She follows the category two eating plan 75% of the time and exercises 20 minutes three days a week. Concerns about GLP-1 agonists due to potential stomach paralysis, though it is rare. Discussed potential benefits of GLP-1 agonists if sleep apnea is confirmed, as insurance may cover it. - Continue Wellbutrin  XL 300 mg daily - Continue category two eating plan - Continue exercise regimen - Referred to sleep specialist for consultation regarding potential sleep apnea  Essential hypertension Hypertension with elevated blood pressure readings of 159/95 and 155/109. Current medications include losartan  100 mg daily, propranolol  120 mg daily, and chlorthalidone  25 mg daily. Potential contribution of sleep apnea to hypertension discussed. - Continue current antihypertensive medications - Referred to sleep specialist for consultation regarding potential sleep apnea  Gastroesophageal reflux disease (GERD) GERD managed with Prilosec 40 mg daily. Weight loss efforts are ongoing. - Continue Prilosec 40 mg daily  Suspected sleep apnea Due to elevated blood pressure, difficulty losing weight, and symptoms such as snoring and feeling draggy upon waking. Previous sleep study was inconclusive over a decade ago. Discussed potential benefits of confirming  sleep apnea for insurance coverage of GLP-1 agonists. Home sleep study preferred due to difficulty sleeping in a lab setting. - Referred to sleep specialist for consultation regarding potential sleep apnea - Will consider home sleep study if recommended by sleep specialist  Vit D deficiency and prediabetes She is due for labs -Check labs, continue eating plan and review results at follow up visit in 1 month      Patients who are on anti-obesity medications are counseled on the importance of maintaining healthy lifestyle habits, including balanced nutrition, regular physical activity, and behavioral modifications,  Medication is an adjunct to, not a replacement for, lifestyle changes and that the long-term success and weight maintenance depend on continued adherence to these strategies.   Laraine was informed of the importance of frequent follow up visits to maximize her success with intensive lifestyle modifications for her obesity and obesity related health conditions as recommended by USPSTF and CMS guidelines  Coyle Stordahl  [place], MD

## 2024-08-13 ENCOUNTER — Ambulatory Visit (INDEPENDENT_AMBULATORY_CARE_PROVIDER_SITE_OTHER): Payer: Self-pay | Admitting: Family Medicine

## 2024-09-19 NOTE — Progress Notes (Signed)
 "  @[facility]   Provider:  Dedra Gores, MD  Primary Care Physician:  Juliane Che, PA [address] [place] [address]   Referring Provider: Verdon Louann BIRCH, Md [address] [place],  [place] [address]        Chief Concern for this Consultation:   Patient presents with          HPI: I have the pleasure of meeting with Erin Warren , on 09/20/24 , who is a 49 y.o.  female patient, left-handed female on propanolol and Maxalt - Dr Chancy  patient: She is referred  today by Dr. Louann Verdon:  with history polycystic ovarian disease, teratoma, asthma, hypertension,migraine headaches.   Patient has had intermittent headaches since age 69 years old. Typically they are pressure, frontal and bitemporal headaches, one per month lasting one hour at a time. Sometimes she has nausea, fatigue, sees sparkles of light with the bad headaches. Vision tends to be photosensitive generally speaking. No phonophobia. Sometimes she has shoulder and neck pain, sometimes sinus congestion with these headaches. Over past 6-7 months patient has had change in her headaches. Now they are more severe and more frequent. Now she's having headaches every 2-3 days, lasting hours or one day at a time. She's been using naproxen 2-3 times per week to help with headaches. Triggering factors include menstrual cycle, change in weather, stress. Family history positive for migraine in patient's maternal aunt. Patient's mother has similar headaches as well but not officially diagnosed with migraine.    She has rarely migraines now and propanolol controlled these headaches. She is here for possible sleep apnea, husband has been concerned  about her pausing or shallow  breathing, snoring.  She has a severe overbite , BMI 40.5 , large neck  with thyroid disease.   Per Epic list:  has a past medical history of AC (acromioclavicular) joint bone spurs, Allergy , Asthma, Bunion, Edema, lower extremity, GERD  (gastroesophageal reflux disease), Heel spur, Hypertension, Hypothyroidism, Migraines, Multiple food allergies, Polycystic ovarian syndrome, Seasonal allergies, Thyroid disease, and Ulnar nerve abnormality..  ENT  problems: (Sinusitis, seasonal allergies, rhinitis- abnormal jaw- severe GERD, with hiatal hernia , affecting sleep (!)   Thyroid disease The patient had one previous sleep evaluation, she was unable to sleep .    Family medical history: There are  biological family members affected by Sleep apnea ( father with CSA /Possibly brother ), or Insomnia (/) , by excessive daytime sleepiness (father).     Social history: The patient  is working in photographer , tour manager and procedures- ,lives in a private home, in a household with husband  and has 2 cats as  pets ( no children ).   The patient currently works in daytime  The workplace involves no physical activity, outdoor activity, travel.  Nicotine use: /.  ETOH use: /,  Caffeine intake in form of: Coffee (in AM ), Soft drinks (/), Tea ( 2 -3 Pm ) , no Energy drinks ( including those containing  taurine ). Caffeine is last consumed at 3 Pm .  Exercises not regularly -      Sleep habits and routines are as follows: The patient's dinner time is around 6-7 PM.   The patient goes to bed at, or close to, 11.30 PM. The bedroom is not shared and is described as quiet, and dark.  The patient reports that it takes 10 minutes to fall asleep, then continues to sleep for 5-7  hours, uninterrupted or  woken up by the need to void ( one time Nocturia).   The preferred sleep position is that of a side sleeper , with support of 2 pillows, (non- adjustable  ). The total estimated sleep time is circa 6 hours.  Dreams are reportedly frequent/ and can be vivid.  Dream enactment has been reported.   5.30 AM is the usual week- day rise time. The patient wakes up with an alarm set at 5.30.   She reports not feeling refreshed and restored in the morning, waking with  symptoms such as dry mouth, morning headaches, rarely stiffness or pain, and fatigue.  No sleep paralysis has been experienced.    Naps in daytime are taken seldomly  (there is rarely a desire to nap and opportunity), lasting from 20 to 30 and have a refreshing quality. These do not interfere with nocturnal sleep.    Review of Systems: Out of a complete 14 system review, the patient complains of only the following symptoms, and all other reviewed systems are negative.:  Snoring, but no Nocturia   How likely are you to doze in the following situations: 0 = not likely, 1 = slight chance, 2 = moderate chance, 3 = high chance Sitting and Reading? Watching Television? Sitting inactive in a public place (theater or meeting)? As a passenger in a car for an hour without a break? Lying down in the afternoon when circumstances permit? Sitting and talking to someone? Sitting quietly after lunch without alcohol? In a car, while stopped for a few minutes in traffic?   Total ESS =2 / 24 points.    FSS endorsed at 46/ 63 points.  GDS:   Social History   Socioeconomic History   Marital status: Significant Other    Spouse name: [name]   Number of children: 0   Years of education: College gr   Highest education level: Not on file  Occupational History   Occupation: Engineer, Civil (consulting): [employer]    Comment: writes procedures  Tobacco Use   Smoking status: Never   Smokeless tobacco: Never  Vaping Use   Vaping status: Never Used  Substance and Sexual Activity   Alcohol use: Yes    Alcohol/week: 1.0 standard drink of alcohol    Types: 1 Glasses of wine per week    Comment: 1-4/month   Drug use: No   Sexual activity: Yes    Partners: Male    Birth control/protection: Surgical  Other Topics Concern   Not on file  Social History Narrative   Patient lives at home with domestic partner.   Caffeine Use: 1-2 cups of coffee a day   Social Drivers of Health    Tobacco Use: Low Risk (09/20/2024)   Patient History    Smoking Tobacco Use: Never    Smokeless Tobacco Use: Never    Passive Exposure: Not on file  Financial Resource Strain: Low Risk (09/09/2024)   Received from [facility]   Overall Financial Resource Strain (CARDIA)    How hard is it for you to pay for the very basics like food, housing, medical care, and heating?: Not very hard  Food Insecurity: No Food Insecurity (09/09/2024)   Received from [facility]   Epic    Within the past 12 months, you worried that your food would run out before you got the money to buy more.: Never true    Within the past 12 months, the food you bought just didn't last  and you didn't have money to get more.: Never true  Transportation Needs: No Transportation Needs (09/09/2024)   Received from [facility]    In the past 12 months, has lack of transportation kept you from medical appointments or from getting medications?: No    In the past 12 months, has lack of transportation kept you from meetings, work, or from getting things needed for daily living?: No  Physical Activity: Sufficiently Active (09/09/2024)   Received from [facility]   Exercise Vital Sign    On average, how many days per week do you engage in moderate to strenuous exercise (like a brisk walk)?: 5 days    On average, how many minutes do you engage in exercise at this level?: 40 min  Stress: No Stress Concern Present (09/09/2024)   Received from [facility] - Occupational Stress Questionnaire    Do you feel stress - tense, restless, nervous, or anxious, or unable to sleep at night because your mind is troubled all the time - these days?: Only a little  Social Connections: Somewhat Isolated (09/09/2024)   Received from [facility]   Social Network    How would you rate your social network (family, work, friends)?: Restricted participation with some degree of social isolation   Depression (PHQ2-9): Not on file  Alcohol Screen: Not on file  Housing: Low Risk (09/09/2024)   Received from [facility]    In the last 12 months, was there a time when you were not able to pay the mortgage or rent on time?: No    In the past 12 months, how many times have you moved where you were living?: 0    At any time in the past 12 months, were you homeless or living in a shelter (including now)?: No  Utilities: Not At Risk (09/09/2024)   Received from [facility]    In the past 12 months has the electric, gas, oil, or water company threatened to shut off services in your home?: No  Health Literacy: Not on file    Family History  Problem Relation Age of Onset   Thyroid disease Mother    Allergies Mother    Hypertension Father    Heart disease Father    Cancer Father        skin   Sleep apnea Father    Obesity Father    Hypertension Brother    Heart Problems Brother    Alcohol abuse Maternal Grandfather    Hypertension Paternal Grandmother    Hypertension Paternal Grandfather    Diabetes Paternal Actor     Past Medical History:  Diagnosis Date   AC (acromioclavicular) joint bone spurs    Allergy     Asthma    Bunion    Edema, lower extremity    GERD (gastroesophageal reflux disease)    Heel spur    Both heels, one has fractured off   Hypertension    Hypothyroidism    Migraines    Multiple food allergies    Polycystic ovarian syndrome    Seasonal allergies    Thyroid disease    Ulnar nerve abnormality     Past Surgical History:  Procedure Laterality Date   ELBOW SURGERY Left 07/2021   OOPHORECTOMY Left    PLANTAR FASCIECTOMY Bilateral 08/2014   TERATOMA EXCISION Left    TUBAL LIGATION  2008     Medications Ordered Prior  to Encounter[1]  Allergies[2]  Vitals:   09/20/24 1122  BP: (!) 141/89  Pulse: 84     Physical exam:   General: The patient was alert and appears not in acute distress.  Mood and affect are  appropriate .  The patient's interactions are: Cooperative, makes eye contact, follows the instructions and answers questions coherently.  The patient is groomed and appropriately groomed and dressed. Head: Normocephalic, atraumatic.  Neck is supple. Mallampati: 3.  The neck circumference measured 17 inches. Nasal airflow was patent ,   Overbite / Retrognathia was noted.  Small oral opening with lower jaw crowding, irregular teeth.  Dental status: biological  Cardiovascular:  Regular rate and cardiac rhythm by palpable pulse. Respiratory: no audible wheezing, no tachypnoea.   Skin:  Without evidence of ankle edema. No discoloration.  Trunk:  BMI is 40.5 The patient's posture was erect.   Neurologic exam : The patient was awake and alert, oriented to place and time.   Attention span & concentration ability appeared normal.  Speech was fluent, without dysarthria, dysphonia or aphasia, and of normal volume.     Cranial nerves:  There was no loss of smell or taste reported  Pupils are round, equal in size and briskly reactive to light.  Funduscopic exam was deferred.  Extraocular movements in vertical and horizontal planes were intact and without nystagmus. (No Diplopia reported). Visual fields by finger perimetry are intact. Hearing was intact to soft voice.    Facial sensation intact to fine touch.  Facial motor strength: Symmetric movement and tongue and uvula move midline.  Neck ROM: rotation, tilt and flexion extension were intact for age and shoulder shrug was symmetrical.    Motor exam:  Symmetric bulk, strength and ROM.   Normal tone without cog- wheeling, and symmetric grip strength.   Sensory:  Fine touch and vibration were tested by tuning fork and intact.  Proprioception tested in the upper extremities was normal.   Coordination: The patient reported no problems with button closure and no changes to penmanship.   The Finger-to-nose maneuver was intact without evidence  of ataxia, dysmetria or tremor.   Gait and station: Patient could rise unassisted from a seated position, without bracing, and walked without/ assistive device.  Deep tendon reflexes: Upper extremities did show symmetric DTRs.     I would like to thank Eduard Hanlon , MD and Verdon Parry D, Md [address] [place],  [place] [address] for allowing me to meet with this pleasant patient.    In short, Erin Warren  is presenting  with obesity and fatigue, snoring , morning headaches and dry mouth- ,and this raises a concern about OSA interfering with weight loss progress.  She is not sleepy.  The patient also may be qualified by FDA criteria for the use of Zepbound in moderate or severe OSA.   She is not diabetic, she had normal CMET, CBC and TSH, Vit D.    Risk factors for OSA were present,  including : Body mass index is 40.51 kg/m.,  large neck size  , irregular dentition and upper airway anatomy.  She is fatigued , partially because she allocates only 6 hours of bedtime.  This may have to be advanced by 30 minutes.     My Plan is to proceed with:  HST/ PSG/  this patient prefers a HST .   Screening for sleep apnea, with or without  hypoxia and with or without obesity hypoventilation.  I plan to follow up /through our NP within 5-6 months.   A total time of  35  minutes consistent of a part of face to face encounter , exam and interview,  and additional preparation time for chart review was spent .  At today's visit, we discussed treatment options, associated risk and benefits, and engage in counseling as needed including, but not limited to:  Sleep hygiene, Quality Sleep Habits, and Safety concerns for patients with daytime sleepiness who are warned to not operate machinery/ motor vehicles when drowsy. Risk factors for sleep apnea were identified:   Additionally, the following were reviewed: Past medical records, past medical and surgical history, family and social  background, as well as relevant laboratory results, imaging findings, and medical notes, where applicable.  This note was generated by myself in part by using dictation software, and as a result, it may contain unintentional typos and errors.  Nevertheless, effort was made to accurately convey the pertinent aspects of the patient's visit.   Dedra Gores, MD  [facility] Sleep Board certified in Sleep Medicine by The [employer] of Sleep Medicine and Diplomate of the [employer] of [employer] (AASM) . Board certified In Neurology, Diplomat of the ABPN,  Fellow of the [employer] of Neu[employer].         [1]  Current Outpatient Medications on File Prior to Visit  Medication Sig Dispense Refill   albuterol  (PROVENTIL  HFA;VENTOLIN  HFA) 108 (90 BASE) MCG/ACT inhaler Inhale 2 puffs into the lungs every 6 (six) hours as needed. 1 Inhaler 3   buPROPion  (WELLBUTRIN  XL) 300 MG 24 hr tablet Take 1 tablet (300 mg total) by mouth every morning. 90 tablet 0   chlorthalidone  (HYGROTON ) 25 MG tablet Take 1 tablet (25 mg total) by mouth daily. 90 tablet 0   Cholecalciferol (VITAMIN D ) 2000 UNITS tablet Take 2,000 Units by mouth daily.     EPIPEN 2-PAK 0.3 MG/0.3ML SOAJ injection as needed.     famotidine (PEPCID) 20 MG tablet Take 20 mg by mouth 2 (two) times daily.     levothyroxine  (SYNTHROID , LEVOTHROID) 25 MCG tablet Take 1 tablet (25 mcg total) by mouth daily. 90 tablet 3   loratadine (CLARITIN) 10 MG tablet Take 10 mg by mouth daily.     losartan  (COZAAR ) 100 MG tablet Take 1 tablet (100 mg total) by mouth daily. 90 tablet 0   metFORMIN  (GLUCOPHAGE ) 500 MG tablet TAKE 1 TABLET BY MOUTH TWICE A DAY 60 tablet 0   omeprazole  (PRILOSEC) 40 MG capsule Take 1 capsule (40 mg total) by mouth daily. 90 capsule 0   propranolol  ER (INDERAL  LA) 120 MG 24 hr capsule Take 1 capsule (120 mg total) by mouth daily. 90 capsule 4   PULMICORT FLEXHALER 180 MCG/ACT  inhaler Inhale 2 puffs into the lungs 2 (two) times daily.     rizatriptan  (MAXALT -MLT) 10 MG disintegrating tablet Take 1 tablet (10 mg total) by mouth as needed for migraine. May repeat in 2 hours if needed 9 tablet 12   tiZANidine (ZANAFLEX) 4 MG capsule 4 mg as needed. At bedtime     No current facility-administered medications on file prior to visit.  [2]  Allergies Allergen Reactions   Raspberry Anaphylaxis   Rubus Fruticosus Anaphylaxis   Food Hives, Nausea Only and Swelling    WHEAT   Garlic    Onion    Topamax  [Topiramate ] Other (See Comments)    Flushing, paresthesias (tingling, burning).     "

## 2024-09-20 ENCOUNTER — Encounter: Payer: Self-pay | Admitting: Neurology

## 2024-09-20 ENCOUNTER — Ambulatory Visit: Admitting: Neurology

## 2024-09-20 VITALS — BP 141/89 | HR 84 | Ht 66.0 in | Wt 251.0 lb

## 2024-09-20 DIAGNOSIS — R0683 Snoring: Secondary | ICD-10-CM

## 2024-09-20 DIAGNOSIS — R9089 Other abnormal findings on diagnostic imaging of central nervous system: Secondary | ICD-10-CM | POA: Diagnosis not present

## 2024-09-20 DIAGNOSIS — Z6841 Body Mass Index (BMI) 40.0 and over, adult: Secondary | ICD-10-CM

## 2024-09-20 DIAGNOSIS — Z9189 Other specified personal risk factors, not elsewhere classified: Secondary | ICD-10-CM | POA: Diagnosis not present

## 2024-09-20 DIAGNOSIS — E66813 Obesity, class 3: Secondary | ICD-10-CM

## 2024-09-20 DIAGNOSIS — R5383 Other fatigue: Secondary | ICD-10-CM | POA: Diagnosis not present

## 2024-09-20 NOTE — Patient Instructions (Addendum)
 Insomnia Insomnia is a sleep disorder that makes it difficult to fall asleep or stay asleep. Insomnia can cause fatigue, low energy, difficulty concentrating, mood swings, and poor performance at work or school. There are three different ways to classify insomnia: Difficulty falling asleep. Difficulty staying asleep. Waking up too early in the morning. Any type of insomnia can be long-term (chronic) or short-term (acute). Both are common. Short-term insomnia usually lasts for 3 months or less. Chronic insomnia occurs at least three times a week for longer than 3 months. What are the causes? Insomnia may be caused by another condition, situation, or substance, such as: Having certain mental health conditions, such as anxiety and depression. Using caffeine, alcohol, tobacco, or drugs. Having gastrointestinal conditions, such as gastroesophageal reflux disease (GERD). Having certain medical conditions. These include: Asthma. Alzheimer's disease. Stroke. Chronic pain. An overactive thyroid gland (hyperthyroidism). Other sleep disorders, such as restless legs syndrome and sleep apnea. Menopause. Sometimes, the cause of insomnia may not be known. What increases the risk? Risk factors for insomnia include: Gender. Females are affected more often than males. Age. Insomnia is more common as people get older. Stress and certain medical and mental health conditions. Lack of exercise. Having an irregular work schedule. This may include working night shifts and traveling between different time zones. What are the signs or symptoms? If you have insomnia, the main symptom is having trouble falling asleep or having trouble staying asleep. This may lead to other symptoms, such as: Feeling tired or having low energy. Feeling nervous about going to sleep. Not feeling rested in the morning. Having trouble concentrating. Feeling irritable, anxious, or depressed. How is this diagnosed? This condition  may be diagnosed based on: Your symptoms and medical history. Your health care provider may ask about: Your sleep habits. Any medical conditions you have. Your mental health. A physical exam. How is this treated? Treatment for insomnia depends on the cause. Treatment may focus on treating an underlying condition that is causing the insomnia. Treatment may also include: Medicines to help you sleep. Counseling or therapy. Lifestyle adjustments to help you sleep better. Follow these instructions at home: Eating and drinking  Limit or avoid alcohol, caffeinated beverages, and products that contain nicotine and tobacco, especially close to bedtime. These can disrupt your sleep. Do not eat a large meal or eat spicy foods right before bedtime. This can lead to digestive discomfort that can make it hard for you to sleep. Sleep habits  Keep a sleep diary to help you and your health care provider figure out what could be causing your insomnia. Write down: When you sleep. When you wake up during the night. How well you sleep and how rested you feel the next day. Any side effects of medicines you are taking. What you eat and drink. Make your bedroom a dark, comfortable place where it is easy to fall asleep. Put up shades or blackout curtains to block light from outside. Use a white noise machine to block noise. Keep the temperature cool. Limit screen use before bedtime. This includes: Not watching TV. Not using your smartphone, tablet, or computer. Stick to a routine that includes going to bed and waking up at the same times every day and night. This can help you fall asleep faster. Consider making a quiet activity, such as reading, part of your nighttime routine. Try to avoid taking naps during the day so that you sleep better at night. Get out of bed if you are still awake after  15 minutes of trying to sleep. Keep the lights down, but try reading or doing a quiet activity. When you feel  sleepy, go back to bed. General instructions Take over-the-counter and prescription medicines only as told by your health care provider. Exercise regularly as told by your health care provider. However, avoid exercising in the hours right before bedtime. Use relaxation techniques to manage stress. Ask your health care provider to suggest some techniques that may work well for you. These may include: Breathing exercises. Routines to release muscle tension. Visualizing peaceful scenes. Make sure that you drive carefully. Do not drive if you feel very sleepy. Keep all follow-up visits. This is important. Contact a health care provider if: You are tired throughout the day. You have trouble in your daily routine due to sleepiness. You continue to have sleep problems, or your sleep problems get worse. Get help right away if: You have thoughts about hurting yourself or someone else. Get help right away if you feel like you may hurt yourself or others, or have thoughts about taking your own life. Go to your nearest emergency room or: Call 911. Call the National Suicide Prevention Lifeline at 682-651-7359 or 988. This is open 24 hours a day. Text the Crisis Text Line at (567)373-3475. Summary Insomnia is a sleep disorder that makes it difficult to fall asleep or stay asleep. Insomnia can be long-term (chronic) or short-term (acute). Treatment for insomnia depends on the cause. Treatment may focus on treating an underlying condition that is causing the insomnia. Keep a sleep diary to help you and your health care provider figure out what could be causing your insomnia. This information is not intended to replace advice given to you by your health care provider. Make sure you discuss any questions you have with your health care provider. Document Revised: 08/03/2021 Document Reviewed: 08/03/2021 Elsevier Patient Education  2024 [employer]   would like to thank Eduard Hanlon , MD and Verdon Parry D, Md [address] [place],  [place] [address] for allowing me to meet with this pleasant patient.    In short, Erin Warren  is presenting  with obesity and fatigue, snoring , morning headaches and dry mouth- ,and this raises a concern about OSA interfering with weight loss progress.  She is not sleepy.  The patient also may be qualified by FDA criteria for the use of Zepbound in moderate or severe OSA.    Risk factors for OSA were present,  including : Body mass index is 40.51 kg/m.,  large neck size  , irregular dentition and upper airway anatomy.  She is fatigued , partially because she allocates only 6 hours of bedtime.  This may have to be advanced by 30 minutes.     My Plan is to proceed with:  HST/ PSG/  this patient prefers a HST .   Screening for sleep apnea, with or without  hypoxia and with or without obesity hypoventilation.     I plan to follow up /through our NP within 5-6 months.   A total time of  35  minutes consistent of a part of face to face encounter , exam and interview,  and additional preparation time for chart review was spent .  At today's visit, we discussed treatment options, associated risk and benefits, and engage in counseling as needed including, but not limited to:  Sleep hygiene, Quality Sleep Habits, and Safety concerns for patients with daytime sleepiness who are warned to not operate  machinery/ motor vehicles when drowsy. Risk factors for sleep apnea were identified:   Additionally, the following were reviewed: Past medical records, past medical and surgical history, family and social background, as well as relevant laboratory results, imaging findings, and medical notes, where applicable.  This note was generated by myself in part by using dictation software, and as a result, it may contain unintentional typos and errors.  Nevertheless, effort was made to accurately convey the pertinent aspects of the patient's visit.   Dedra Gores, MD  [facility] and [facility] Sleep Board certified in Sleep Medicine by [employer] and Diplomate of the [employer] ([employer]ASM) . Board certified In Neurology, Diplomat of the ABPN,  Fellow of the [employer].

## 2024-09-25 ENCOUNTER — Encounter (INDEPENDENT_AMBULATORY_CARE_PROVIDER_SITE_OTHER): Payer: Self-pay | Admitting: Family Medicine

## 2024-09-25 ENCOUNTER — Ambulatory Visit (INDEPENDENT_AMBULATORY_CARE_PROVIDER_SITE_OTHER): Admitting: Family Medicine

## 2024-09-25 VITALS — BP 152/90 | HR 101 | Temp 98.0°F | Ht 66.0 in | Wt 244.0 lb

## 2024-09-25 DIAGNOSIS — E669 Obesity, unspecified: Secondary | ICD-10-CM

## 2024-09-25 DIAGNOSIS — Z6839 Body mass index (BMI) 39.0-39.9, adult: Secondary | ICD-10-CM | POA: Diagnosis not present

## 2024-09-25 DIAGNOSIS — I10 Essential (primary) hypertension: Secondary | ICD-10-CM

## 2024-09-25 DIAGNOSIS — F3289 Other specified depressive episodes: Secondary | ICD-10-CM

## 2024-09-25 DIAGNOSIS — K219 Gastro-esophageal reflux disease without esophagitis: Secondary | ICD-10-CM

## 2024-09-25 DIAGNOSIS — F5089 Other specified eating disorder: Secondary | ICD-10-CM | POA: Diagnosis not present

## 2024-09-25 MED ORDER — CHLORTHALIDONE 25 MG PO TABS: 25.0000 mg | ORAL_TABLET | Freq: Every day | ORAL | 0 refills | Status: AC

## 2024-09-25 MED ORDER — LOSARTAN POTASSIUM 100 MG PO TABS: 100.0000 mg | ORAL_TABLET | Freq: Every day | ORAL | 0 refills | Status: AC

## 2024-09-25 MED ORDER — OMEPRAZOLE 40 MG PO CPDR: 40.0000 mg | DELAYED_RELEASE_CAPSULE | Freq: Every day | ORAL | 0 refills | Status: AC

## 2024-09-25 NOTE — Progress Notes (Unsigned)
 "  Office: [phone]  /  Fax: [phone]  WEIGHT SUMMARY AND BIOMETRICS  Anthropometric Measurements Height: 5' 6 (1.676 m) Weight: 244 lb (110.7 kg) BMI (Calculated): 39.4 Weight at Last Visit: 247 lb Weight Lost Since Last Visit: 3 lb Weight Gained Since Last Visit: 0 Starting Weight: 240 lb Total Weight Loss (lbs): 0 lb (0 kg) Peak Weight: 260 lb   Body Composition  Body Fat %: 48.2 % Fat Mass (lbs): 117.8 lbs Muscle Mass (lbs): 120.4 lbs Total Body Water (lbs): 83.8 lbs Visceral Fat Rating : 14   Other Clinical Data Fasting: no Labs: no Today's Visit #: 61 Starting Date: 06/28/18    Chief Complaint: OBESITY    History of Present Illness Erin Warren is a 49 year old female with obesity and hypertension who presents for obesity treatment and progress assessment.  She has been following a category two eating plan approximately 75% of the time and has incorporated yoga into her routine, practicing for 15 minutes about six days a week. Over the last two months, she has lost three pounds. She is actively involved in meal preparation and explores recipes that align with her nutritional goals, using tools like Chat GPT to modify recipes. Additionally, she engages in physical activities such as yoga and belly dancing, which she finds beneficial for both cardio and strengthening.  Her hypertension is managed with losartan  100 mg daily and chlorthalidone  25 mg daily. She mentioned that her blood pressure was decent during her recent physical examination a week or two ago, but she has not been regularly checking her blood pressure at home.  Her gastroesophageal reflux disease (GERD) is managed with Prilosec 40 mg daily, and she requested a refill. She reports stability on this medication and has been using a product called Reflux Gourmet, which she finds effective.  She is addressing emotional eating behaviors with Wellbutrin  XL 300 mg weekly and requested a refill. She  is managing stress related to holiday cooking and maintaining a schedule to avoid feeling overwhelmed.      PHYSICAL EXAM:  Blood pressure (!) 152/90, pulse (!) 101, temperature 98 F (36.7 C), height 5' 6 (1.676 m), weight 244 lb (110.7 kg), SpO2 97%. Body mass index is 39.38 kg/m.  DIAGNOSTIC DATA REVIEWED BY MYSELF TODAY:  BMET    Component Value Date/Time   NA 141 10/06/2023 1434   K 4.3 10/06/2023 1434   CL 102 10/06/2023 1434   CO2 21 10/06/2023 1434   GLUCOSE 99 10/06/2023 1434   GLUCOSE 80 02/10/2016 1735   BUN 18 10/06/2023 1434   CREATININE 0.83 10/06/2023 1434   CREATININE 0.67 02/10/2016 1735   CALCIUM 10.3 (H) 10/06/2023 1434   GFRNONAA 85 12/13/2019 1628   GFRAA 98 12/13/2019 1628   Lab Results  Component Value Date   HGBA1C 5.5 11/11/2022   HGBA1C 4.7 09/27/2013   Lab Results  Component Value Date   INSULIN  15.6 11/11/2022   INSULIN  14.2 06/28/2018   Lab Results  Component Value Date   TSH 3.330 11/11/2022   CBC    Component Value Date/Time   WBC 11.6 (H) 06/28/2018 1037   WBC 11.6 (H) 02/03/2018 1620   RBC 4.85 06/28/2018 1037   RBC 4.72 02/03/2018 1620   HGB 14.8 06/28/2018 1037   HCT 43.8 06/28/2018 1037   PLT 375.0 02/03/2018 1620   PLT 435 (H) 10/18/2017 1640   MCV 90 06/28/2018 1037   MCH 30.5 06/28/2018 1037   MCH 31.3 02/04/2015  1109   MCHC 33.8 06/28/2018 1037   MCHC 34.1 02/03/2018 1620   RDW 13.2 06/28/2018 1037   Iron Studies No results found for: IRON, TIBC, FERRITIN, IRONPCTSAT Lipid Panel     Component Value Date/Time   CHOL 143 11/11/2022 1016   TRIG 115 11/11/2022 1016   HDL 53 11/11/2022 1016   CHOLHDL 3.1 10/18/2017 1640   CHOLHDL 2.7 02/04/2015 1109   VLDL 23 02/04/2015 1109   LDLCALC 69 11/11/2022 1016   Hepatic Function Panel     Component Value Date/Time   PROT 7.1 10/06/2023 1434   ALBUMIN 4.1 10/06/2023 1434   AST 20 10/06/2023 1434   ALT 22 10/06/2023 1434   ALKPHOS 113 10/06/2023 1434    BILITOT <0.2 10/06/2023 1434      Component Value Date/Time   TSH 3.330 11/11/2022 1016   Nutritional Lab Results  Component Value Date   VD25OH 85.0 11/11/2022   VD25OH 100.0 03/08/2022   VD25OH 49.2 12/25/2020     Assessment and Plan Assessment & Plan Obesity, BMI 39.0-39.9 Obesity management is ongoing with a category two eating plan, followed 75% of the time. She engages in yoga for 15 minutes, six days a week, and has lost three pounds in the last two months. She is exploring meal prepping strategies to align with nutritional goals. - Continue category two eating plan - Continue yoga exercise regimen - Provided recipes for meal prepping to align with nutritional goals  Essential hypertension Hypertension is managed with losartan  100 mg daily and chlorthalidone  25 mg daily. Blood pressure readings are elevated at 152/90 and 148/102. She does not regularly monitor blood pressure at home. Potential contributing factors include GI and hormonal issues. - Refilled losartan  and chlorthalidone  - Encouraged home blood pressure monitoring once a week  Gastroesophageal reflux disease GERD is managed with omeprazole  40 mg daily. She reports effective symptom control and uses additional strategies such as Reflux Gourmet and a wedge pillow for sleep. - Refilled omeprazole  - Continue use of Reflux Gourmet and wedge pillow  Emotional eating behaviors Managed with bupropion  XL 300 mg weekly. She reports stability on this regimen. - Continue bupropion  XL 300 mg weekly      Patients who are on anti-obesity medications are counseled on the importance of maintaining healthy lifestyle habits, including balanced nutrition, regular physical activity, and behavioral modifications,  Medication is an adjunct to, not a replacement for, lifestyle changes and that the long-term success and weight maintenance depend on continued adherence to these strategies.   Erin Warren was informed of the importance  of frequent follow up visits to maximize her success with intensive lifestyle modifications for her obesity and obesity related health conditions as recommended by USPSTF and CMS guidelines  Louann Penton, MD   "

## 2024-09-27 ENCOUNTER — Ambulatory Visit: Admitting: Neurology

## 2024-09-27 DIAGNOSIS — R0683 Snoring: Secondary | ICD-10-CM

## 2024-09-27 DIAGNOSIS — Z6841 Body Mass Index (BMI) 40.0 and over, adult: Secondary | ICD-10-CM

## 2024-09-27 DIAGNOSIS — R5383 Other fatigue: Secondary | ICD-10-CM

## 2024-09-27 DIAGNOSIS — R9089 Other abnormal findings on diagnostic imaging of central nervous system: Secondary | ICD-10-CM

## 2024-09-27 DIAGNOSIS — Z9189 Other specified personal risk factors, not elsewhere classified: Secondary | ICD-10-CM

## 2024-10-10 NOTE — Progress Notes (Unsigned)
 SABRA

## 2024-10-23 ENCOUNTER — Ambulatory Visit (INDEPENDENT_AMBULATORY_CARE_PROVIDER_SITE_OTHER): Admitting: Family Medicine

## 2024-11-20 ENCOUNTER — Ambulatory Visit (INDEPENDENT_AMBULATORY_CARE_PROVIDER_SITE_OTHER): Admitting: Family Medicine
